# Patient Record
Sex: Male | Born: 1945
Health system: Southern US, Community
[De-identification: ages and names within clinical notes are randomized; demographics above are authoritative.]

## PROBLEM LIST (undated history)

## (undated) DIAGNOSIS — R002 Palpitations: Secondary | ICD-10-CM

## (undated) DIAGNOSIS — E7800 Pure hypercholesterolemia, unspecified: Secondary | ICD-10-CM

## (undated) DIAGNOSIS — K449 Diaphragmatic hernia without obstruction or gangrene: Secondary | ICD-10-CM

## (undated) DIAGNOSIS — N529 Male erectile dysfunction, unspecified: Secondary | ICD-10-CM

## (undated) DIAGNOSIS — Z8711 Personal history of peptic ulcer disease: Secondary | ICD-10-CM

## (undated) DIAGNOSIS — E119 Type 2 diabetes mellitus without complications: Secondary | ICD-10-CM

## (undated) DIAGNOSIS — T4145XA Adverse effect of unspecified anesthetic, initial encounter: Secondary | ICD-10-CM

## (undated) DIAGNOSIS — Z87442 Personal history of urinary calculi: Secondary | ICD-10-CM

## (undated) DIAGNOSIS — K5790 Diverticulosis of intestine, part unspecified, without perforation or abscess without bleeding: Secondary | ICD-10-CM

## (undated) DIAGNOSIS — Z91038 Other insect allergy status: Secondary | ICD-10-CM

## (undated) DIAGNOSIS — K2990 Gastroduodenitis, unspecified, without bleeding: Secondary | ICD-10-CM

## (undated) DIAGNOSIS — C801 Malignant (primary) neoplasm, unspecified: Secondary | ICD-10-CM

## (undated) DIAGNOSIS — K2970 Gastritis, unspecified, without bleeding: Secondary | ICD-10-CM

## (undated) DIAGNOSIS — K222 Esophageal obstruction: Secondary | ICD-10-CM

## (undated) DIAGNOSIS — D369 Benign neoplasm, unspecified site: Secondary | ICD-10-CM

## (undated) DIAGNOSIS — F419 Anxiety disorder, unspecified: Secondary | ICD-10-CM

## (undated) DIAGNOSIS — N400 Enlarged prostate without lower urinary tract symptoms: Secondary | ICD-10-CM

## (undated) DIAGNOSIS — Z91030 Bee allergy status: Secondary | ICD-10-CM

## (undated) DIAGNOSIS — M1990 Unspecified osteoarthritis, unspecified site: Secondary | ICD-10-CM

## (undated) DIAGNOSIS — I10 Essential (primary) hypertension: Secondary | ICD-10-CM

## (undated) DIAGNOSIS — K648 Other hemorrhoids: Secondary | ICD-10-CM

## (undated) DIAGNOSIS — I440 Atrioventricular block, first degree: Secondary | ICD-10-CM

## (undated) DIAGNOSIS — J45909 Unspecified asthma, uncomplicated: Secondary | ICD-10-CM

## (undated) DIAGNOSIS — K219 Gastro-esophageal reflux disease without esophagitis: Secondary | ICD-10-CM

## (undated) DIAGNOSIS — T8859XA Other complications of anesthesia, initial encounter: Secondary | ICD-10-CM

## (undated) DIAGNOSIS — B958 Unspecified staphylococcus as the cause of diseases classified elsewhere: Secondary | ICD-10-CM

## (undated) HISTORY — DX: Anxiety disorder, unspecified: F41.9

## (undated) HISTORY — DX: Personal history of urinary calculi: Z87.442

## (undated) HISTORY — DX: Benign neoplasm, unspecified site: D36.9

## (undated) HISTORY — PX: OTHER SURGICAL HISTORY: SHX169

## (undated) HISTORY — DX: Pure hypercholesterolemia, unspecified: E78.00

## (undated) HISTORY — DX: Other insect allergy status: Z91.038

## (undated) HISTORY — DX: Atrioventricular block, first degree: I44.0

## (undated) HISTORY — DX: Esophageal obstruction: K22.2

## (undated) HISTORY — DX: Benign prostatic hyperplasia without lower urinary tract symptoms: N40.0

## (undated) HISTORY — DX: Type 2 diabetes mellitus without complications: E11.9

## (undated) HISTORY — DX: Essential (primary) hypertension: I10

## (undated) HISTORY — DX: Diverticulosis of intestine, part unspecified, without perforation or abscess without bleeding: K57.90

## (undated) HISTORY — DX: Gastro-esophageal reflux disease without esophagitis: K21.9

## (undated) HISTORY — DX: Personal history of peptic ulcer disease: Z87.11

## (undated) HISTORY — DX: Unspecified asthma, uncomplicated: J45.909

## (undated) HISTORY — DX: Diaphragmatic hernia without obstruction or gangrene: K44.9

## (undated) HISTORY — DX: Other hemorrhoids: K64.8

## (undated) HISTORY — DX: Bee allergy status: Z91.030

## (undated) HISTORY — DX: Palpitations: R00.2

## (undated) HISTORY — DX: Male erectile dysfunction, unspecified: N52.9

## (undated) HISTORY — DX: Gastritis, unspecified, without bleeding: K29.70

## (undated) HISTORY — DX: Gastroduodenitis, unspecified, without bleeding: K29.90

---

## 1998-03-21 ENCOUNTER — Ambulatory Visit (HOSPITAL_COMMUNITY): Admission: RE | Admit: 1998-03-21 | Discharge: 1998-03-21 | Payer: Self-pay | Admitting: General Surgery

## 2000-01-07 ENCOUNTER — Emergency Department (HOSPITAL_COMMUNITY): Admission: EM | Admit: 2000-01-07 | Discharge: 2000-01-07 | Payer: Self-pay | Admitting: Emergency Medicine

## 2000-01-07 ENCOUNTER — Encounter: Payer: Self-pay | Admitting: Emergency Medicine

## 2000-03-01 ENCOUNTER — Encounter: Payer: Self-pay | Admitting: Gastroenterology

## 2000-03-01 ENCOUNTER — Ambulatory Visit (HOSPITAL_COMMUNITY): Admission: RE | Admit: 2000-03-01 | Discharge: 2000-03-01 | Payer: Self-pay | Admitting: Gastroenterology

## 2001-03-19 ENCOUNTER — Encounter: Admission: RE | Admit: 2001-03-19 | Discharge: 2001-03-19 | Payer: Self-pay | Admitting: Gastroenterology

## 2001-03-19 ENCOUNTER — Encounter: Payer: Self-pay | Admitting: Gastroenterology

## 2001-12-17 ENCOUNTER — Ambulatory Visit (HOSPITAL_COMMUNITY): Admission: RE | Admit: 2001-12-17 | Discharge: 2001-12-17 | Payer: Self-pay | Admitting: Orthopedic Surgery

## 2001-12-17 ENCOUNTER — Encounter: Payer: Self-pay | Admitting: Orthopedic Surgery

## 2002-02-03 ENCOUNTER — Inpatient Hospital Stay (HOSPITAL_COMMUNITY): Admission: RE | Admit: 2002-02-03 | Discharge: 2002-02-07 | Payer: Self-pay | Admitting: Neurosurgery

## 2002-02-03 ENCOUNTER — Encounter: Payer: Self-pay | Admitting: Neurosurgery

## 2002-07-22 ENCOUNTER — Encounter: Payer: Self-pay | Admitting: Emergency Medicine

## 2002-07-22 ENCOUNTER — Emergency Department (HOSPITAL_COMMUNITY): Admission: EM | Admit: 2002-07-22 | Discharge: 2002-07-22 | Payer: Self-pay | Admitting: Emergency Medicine

## 2003-10-12 ENCOUNTER — Ambulatory Visit (HOSPITAL_COMMUNITY): Admission: RE | Admit: 2003-10-12 | Discharge: 2003-10-12 | Payer: Self-pay | Admitting: Gastroenterology

## 2004-03-14 ENCOUNTER — Ambulatory Visit: Payer: Self-pay | Admitting: Gastroenterology

## 2004-10-26 ENCOUNTER — Ambulatory Visit: Payer: Self-pay | Admitting: Cardiovascular Disease

## 2004-10-30 ENCOUNTER — Ambulatory Visit: Payer: Self-pay | Admitting: Gastroenterology

## 2004-11-01 ENCOUNTER — Ambulatory Visit (HOSPITAL_COMMUNITY): Admission: RE | Admit: 2004-11-01 | Discharge: 2004-11-01 | Payer: Self-pay | Admitting: Gastroenterology

## 2004-11-23 ENCOUNTER — Ambulatory Visit: Payer: Self-pay | Admitting: Gastroenterology

## 2005-05-29 ENCOUNTER — Ambulatory Visit: Payer: Self-pay | Admitting: Gastroenterology

## 2005-06-22 ENCOUNTER — Ambulatory Visit: Payer: Self-pay | Admitting: Gastroenterology

## 2005-07-25 ENCOUNTER — Ambulatory Visit: Payer: Self-pay | Admitting: Gastroenterology

## 2005-11-05 ENCOUNTER — Ambulatory Visit: Payer: Self-pay | Admitting: Cardiovascular Disease

## 2005-12-12 ENCOUNTER — Ambulatory Visit: Payer: Self-pay | Admitting: Gastroenterology

## 2005-12-31 ENCOUNTER — Ambulatory Visit: Payer: Self-pay | Admitting: Gastroenterology

## 2006-01-18 ENCOUNTER — Ambulatory Visit: Payer: Self-pay | Admitting: Cardiology

## 2006-01-25 ENCOUNTER — Encounter: Payer: Self-pay | Admitting: Internal Medicine

## 2006-01-25 ENCOUNTER — Ambulatory Visit (HOSPITAL_COMMUNITY): Admission: RE | Admit: 2006-01-25 | Discharge: 2006-01-25 | Payer: Self-pay | Admitting: Internal Medicine

## 2006-02-13 ENCOUNTER — Ambulatory Visit: Payer: Self-pay | Admitting: Gastroenterology

## 2006-02-26 ENCOUNTER — Encounter (INDEPENDENT_AMBULATORY_CARE_PROVIDER_SITE_OTHER): Payer: Self-pay | Admitting: Specialist

## 2006-02-26 ENCOUNTER — Ambulatory Visit: Payer: Self-pay | Admitting: Gastroenterology

## 2006-09-04 ENCOUNTER — Ambulatory Visit: Payer: Self-pay | Admitting: Gastroenterology

## 2006-09-17 ENCOUNTER — Ambulatory Visit: Payer: Self-pay | Admitting: Gastroenterology

## 2006-12-10 ENCOUNTER — Ambulatory Visit: Payer: Self-pay | Admitting: Internal Medicine

## 2007-01-09 ENCOUNTER — Ambulatory Visit: Payer: Self-pay | Admitting: Cardiovascular Disease

## 2007-02-12 ENCOUNTER — Ambulatory Visit: Payer: Self-pay | Admitting: Internal Medicine

## 2007-02-12 LAB — CONVERTED CEMR LAB
Fecal Occult Blood: NEGATIVE
OCCULT 1: NEGATIVE
OCCULT 2: NEGATIVE
OCCULT 3: NEGATIVE
OCCULT 4: NEGATIVE
OCCULT 5: NEGATIVE

## 2007-03-07 ENCOUNTER — Ambulatory Visit: Payer: Self-pay | Admitting: Cardiovascular Disease

## 2007-04-01 ENCOUNTER — Ambulatory Visit: Payer: Self-pay | Admitting: Internal Medicine

## 2007-04-02 ENCOUNTER — Ambulatory Visit: Payer: Self-pay | Admitting: Internal Medicine

## 2007-06-27 DIAGNOSIS — I10 Essential (primary) hypertension: Secondary | ICD-10-CM | POA: Insufficient documentation

## 2007-06-27 DIAGNOSIS — F528 Other sexual dysfunction not due to a substance or known physiological condition: Secondary | ICD-10-CM | POA: Insufficient documentation

## 2007-06-27 DIAGNOSIS — Z87898 Personal history of other specified conditions: Secondary | ICD-10-CM | POA: Insufficient documentation

## 2007-06-27 DIAGNOSIS — M479 Spondylosis, unspecified: Secondary | ICD-10-CM | POA: Insufficient documentation

## 2007-06-27 DIAGNOSIS — K589 Irritable bowel syndrome without diarrhea: Secondary | ICD-10-CM | POA: Insufficient documentation

## 2007-06-27 DIAGNOSIS — F411 Generalized anxiety disorder: Secondary | ICD-10-CM | POA: Insufficient documentation

## 2007-06-27 DIAGNOSIS — K219 Gastro-esophageal reflux disease without esophagitis: Secondary | ICD-10-CM | POA: Insufficient documentation

## 2007-12-10 ENCOUNTER — Telehealth: Payer: Self-pay | Admitting: Internal Medicine

## 2008-01-12 ENCOUNTER — Encounter (INDEPENDENT_AMBULATORY_CARE_PROVIDER_SITE_OTHER): Payer: Self-pay | Admitting: *Deleted

## 2008-06-24 ENCOUNTER — Telehealth: Payer: Self-pay | Admitting: Internal Medicine

## 2008-07-21 ENCOUNTER — Ambulatory Visit: Payer: Self-pay | Admitting: Internal Medicine

## 2008-07-21 DIAGNOSIS — R1319 Other dysphagia: Secondary | ICD-10-CM | POA: Insufficient documentation

## 2008-07-22 ENCOUNTER — Telehealth: Payer: Self-pay | Admitting: Internal Medicine

## 2008-08-05 ENCOUNTER — Encounter: Payer: Self-pay | Admitting: Cardiovascular Disease

## 2008-08-05 ENCOUNTER — Ambulatory Visit: Payer: Self-pay | Admitting: Cardiovascular Disease

## 2008-08-05 DIAGNOSIS — R002 Palpitations: Secondary | ICD-10-CM | POA: Insufficient documentation

## 2008-08-05 DIAGNOSIS — M129 Arthropathy, unspecified: Secondary | ICD-10-CM | POA: Insufficient documentation

## 2008-09-07 ENCOUNTER — Ambulatory Visit: Payer: Self-pay | Admitting: Internal Medicine

## 2008-09-09 ENCOUNTER — Telehealth: Payer: Self-pay | Admitting: Internal Medicine

## 2008-09-15 ENCOUNTER — Encounter: Payer: Self-pay | Admitting: Internal Medicine

## 2008-09-15 ENCOUNTER — Ambulatory Visit: Payer: Self-pay | Admitting: Internal Medicine

## 2008-09-15 HISTORY — PX: COLONOSCOPY W/ BIOPSIES AND POLYPECTOMY: SHX1376

## 2008-09-19 ENCOUNTER — Encounter: Payer: Self-pay | Admitting: Internal Medicine

## 2008-10-27 ENCOUNTER — Emergency Department (HOSPITAL_COMMUNITY): Admission: EM | Admit: 2008-10-27 | Discharge: 2008-10-27 | Payer: Self-pay | Admitting: Emergency Medicine

## 2008-10-27 ENCOUNTER — Telehealth: Payer: Self-pay | Admitting: Internal Medicine

## 2008-10-27 ENCOUNTER — Telehealth: Payer: Self-pay | Admitting: Gastroenterology

## 2008-10-28 ENCOUNTER — Telehealth: Payer: Self-pay | Admitting: Internal Medicine

## 2008-11-01 ENCOUNTER — Ambulatory Visit: Payer: Self-pay | Admitting: Internal Medicine

## 2008-11-01 DIAGNOSIS — R1013 Epigastric pain: Secondary | ICD-10-CM | POA: Insufficient documentation

## 2008-11-01 DIAGNOSIS — Z860100 Personal history of colon polyps, unspecified: Secondary | ICD-10-CM | POA: Insufficient documentation

## 2009-04-21 ENCOUNTER — Telehealth: Payer: Self-pay | Admitting: Internal Medicine

## 2009-06-08 ENCOUNTER — Encounter (INDEPENDENT_AMBULATORY_CARE_PROVIDER_SITE_OTHER): Payer: Self-pay | Admitting: *Deleted

## 2009-08-16 ENCOUNTER — Ambulatory Visit: Payer: Self-pay | Admitting: Internal Medicine

## 2009-08-18 ENCOUNTER — Encounter (INDEPENDENT_AMBULATORY_CARE_PROVIDER_SITE_OTHER): Payer: Self-pay | Admitting: *Deleted

## 2009-09-21 ENCOUNTER — Ambulatory Visit: Payer: Self-pay | Admitting: Cardiovascular Disease

## 2009-09-21 DIAGNOSIS — I440 Atrioventricular block, first degree: Secondary | ICD-10-CM | POA: Insufficient documentation

## 2009-09-28 ENCOUNTER — Ambulatory Visit: Payer: Self-pay | Admitting: Cardiovascular Disease

## 2009-10-05 ENCOUNTER — Encounter (INDEPENDENT_AMBULATORY_CARE_PROVIDER_SITE_OTHER): Payer: Self-pay | Admitting: *Deleted

## 2009-10-05 LAB — CONVERTED CEMR LAB
ALT: 25 units/L (ref 0–53)
AST: 22 units/L (ref 0–37)
Albumin: 4.2 g/dL (ref 3.5–5.2)
Alkaline Phosphatase: 76 units/L (ref 39–117)
Bilirubin, Direct: 0.1 mg/dL (ref 0.0–0.3)
Cholesterol: 167 mg/dL (ref 0–200)
HDL: 34.8 mg/dL — ABNORMAL LOW (ref >39.00)
LDL Cholesterol: 114 mg/dL — ABNORMAL HIGH (ref 0–99)
Total Bilirubin: 0.6 mg/dL (ref 0.3–1.2)
Total CHOL/HDL Ratio: 5
Total Protein: 7.3 g/dL (ref 6.0–8.3)
Triglycerides: 93 mg/dL (ref 0.0–149.0)
VLDL: 18.6 mg/dL (ref 0.0–40.0)

## 2009-12-14 ENCOUNTER — Ambulatory Visit: Payer: Self-pay | Admitting: Internal Medicine

## 2009-12-14 ENCOUNTER — Encounter (INDEPENDENT_AMBULATORY_CARE_PROVIDER_SITE_OTHER): Payer: Self-pay | Admitting: *Deleted

## 2010-02-07 ENCOUNTER — Ambulatory Visit: Payer: Self-pay | Admitting: Internal Medicine

## 2010-02-07 HISTORY — PX: UPPER GASTROINTESTINAL ENDOSCOPY: SHX188

## 2010-02-13 ENCOUNTER — Telehealth: Payer: Self-pay | Admitting: Cardiovascular Disease

## 2010-06-13 NOTE — Assessment & Plan Note (Signed)
Summary: dysphagia...as.    History of Present Illness Visit Type: Follow-up Visit Primary GI MD: Stan Head MD Primary Provider: n/a Requesting Provider: n/a Chief Complaint: dysphagia  History of Present Illness:   65 yo white man with GERD and recurrent dysphagia that responds to periodic esophageal dilation (no stricture). In the last 2-3 weeks some recurrent dysphagia though he does not think he needs a dilation. Recent left flank pain thought due to puuled muscle and treated with a patch/topical therapy. There is a knot on ribs also, now. His new prescription plan will not cover Zegerid.   GI Review of Systems    Reports acid reflux and  dysphagia with solids.      Denies abdominal pain, belching, bloating, chest pain, dysphagia with liquids, heartburn, loss of appetite, nausea, vomiting, vomiting blood, weight loss, and  weight gain.        Denies anal fissure, black tarry stools, change in bowel habit, constipation, diarrhea, diverticulosis, fecal incontinence, heme positive stool, hemorrhoids, irritable bowel syndrome, jaundice, light color stool, liver problems, rectal bleeding, and  rectal pain.    EGD  Procedure date:  09/15/2008  Findings:      1) Mildly tortuous esophagus, dilated with 54 French dilator due to dysphagia and known benefit  2) 2 cm hiatal hernia in the cardia  3) Otherwise normal examination.   Current Medications (verified): 1)  Alprazolam 0.25 Mg Tabs (Alprazolam) .... Take 1 Tablet By Mouth Once Daily 2)  Zegerid 20-1100 Mg Caps (Omeprazole-Sodium Bicarbonate) .[name].. 1 Capsule By Mouth Two Times A Day 3)  Robaxin 500 Mg Tabs (Methocarbamol) .... 1/2 Pill As Needed 4)  Naprosyn 500 Mg Tabs (Naproxen) .[name].. 1 Tablet By Mouth Once Daily As Needed 5)  Cozaar 50 Mg Tabs (Losartan Potassium) .[name].. 1 Once Daily 6)  Cialis 5 Mg Tabs (Tadalafil) .... 1/2 As Needed 7)  Cvs Vitamin E 400 Unit Caps (Vitamin E) .... One Tablet By Mouth Once Daily 8)   Benefiber  Powd (Wheat Dextrin) .[name].. 1-2 Teaspoons Once Daily 9)  Advair Diskus 100-50 Mcg/dose Misc (Fluticasone-Salmeterol) .... As Needed 10)  Omega-3 350 Mg Caps (Omega-3 Fatty Acids) .... One Tablet By Mouth Once Daily  Allergies (verified): 1)  ! * Dilaudid  Past History:  Past Medical History: Diverticulosis IBS  Anxiety GERD Chronic recurrent dysphagia Osteoarthritis, C-spine especially Erectie Dysfunction BPH Hypertension Palpitations Adenomatous colon polyps May 2010  Past Surgical History: right foot surgery bilateral inguinal hernia repair nasal septoplasty lower back surgery with lumbar vertebral repair  Family History: Reviewed history from 07/21/2008 and no changes required. No FH of Colon Cancer: father had lung cancer Family History of Heart Disease: father  Social History: Reviewed history from 07/21/2008 and no changes required. 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  Alcohol Use - no Daily Caffeine Use sodas and tea Illicit Drug Use - no avid tennis player  Vital Signs:  Patient profile:   65 year old male Height:      71 inches Weight:      239 pounds BMI:     33.45 BSA:     2.28 Pulse rate:   76 / minute Pulse rhythm:   regular BP sitting:   142 / 86  (left arm) Cuff size:   regular  Vitals Entered By: Ok Anis CMA (August 16, 2009 3:43 PM)  Physical Exam  General:  Well developed, well nourished, no acute distress.   Impression & Recommendations:  Problem # 1:  GERD (  ICD-530.81) Assessment Unchanged 15 min spent discussing today he is ok on two times a day OTC Zegerid but naybe not quite as well as 40 mg once daily. It looks like generic Zegerid 40 mg is on his formulary and we will prescribe that.  Problem # 2:  DYSPHAGIA (ICD-787.29) Assessment: Deteriorated Slightly worse but "not ready for a dilation" He has this and its multifactorial with components of GERD, anxiety phenomenon and ? C-spine  contribution. Will observe and monitor, follow-up as needed  Patient Instructions: 1)  Please continue current medications.  2)  We have a sent a refill for generic zegerid.  If this requires a prior Berkley Harvey we will call the insurance company for you and work this out. 3)  The medication list was reviewed and reconciled.  All changed / newly prescribed medications were explained.  A complete medication list was provided to the patient / caregiver. Prescriptions: OMEPRAZOLE-SODIUM BICARBONATE 40-1100 MG CAPS (OMEPRAZOLE-SODIUM BICARBONATE) 1 by mouth 30-60 mnutes before breakfast  #30 x 11   Entered and Authorized by:   Iva Boop MD, [facility]   Signed by:   Iva Boop MD, [facility] on 08/16/2009   Method used:   Electronically to        [facility] (retail)       [address]       [place], [place]  [id number]       Ph: [phone]       Fax: [phone]   RxID:   [id number]

## 2010-06-13 NOTE — Miscellaneous (Signed)
Summary: miralax prep   Clinical Lists Changes  Medications: Added new medication of DULCOLAX 5 MG  TBEC (BISACODYL) Day before procedure take 2 at 3pm and 2 at 8pm. - Signed Added new medication of METOCLOPRAMIDE HCL 10 MG  TABS (METOCLOPRAMIDE HCL) As per prep instructions. - Signed Added new medication of MIRALAX   POWD (POLYETHYLENE GLYCOL 3350) As per prep  instructions. - Signed Rx of DULCOLAX 5 MG  TBEC (BISACODYL) Day before procedure take 2 at 3pm and 2 at 8pm.;  #4 x 0;  Signed;  Entered by: Burman Foster RN;  Authorized by: Iva Boop MD;  Method used: Electronically to [facility] [address], [place], [place], [place]  [id number], Ph: [phone], Fax: [phone] Rx of METOCLOPRAMIDE HCL 10 MG  TABS (METOCLOPRAMIDE HCL) As per prep instructions.;  #2 x 0;  Signed;  Entered by: Burman Foster RN;  Authorized by: Iva Boop MD;  Method used: Electronically to [facility] [address], [place], [place], [place]  [id number], Ph: [phone], Fax: [phone] Rx of MIRALAX   POWD (POLYETHYLENE GLYCOL 3350) As per prep  instructions.;  #255gm x 0;  Signed;  Entered by: Burman Foster RN;  Authorized by: Iva Boop MD;  Method used: Electronically to [facility] [address], [place], [place], [place]  [id number], Ph: [phone], Fax: [phone]    Prescriptions: MIRALAX   POWD (POLYETHYLENE GLYCOL 3350) As per prep  instructions.  #255gm x 0   Entered by:   Burman Foster RN   Authorized by:   Iva Boop MD   Signed by:   Burman Foster RN on 09/07/2008   Method used:   Electronically to        [facility] (retail)       [address]       [place], [place]  [id number]       Ph: [phone]       Fax: [phone]   RxID:   [id number] METOCLOPRAMIDE HCL 10 MG  TABS (METOCLOPRAMIDE HCL) As per prep instructions.  #2 x 0   Entered by:    Burman Foster RN   Authorized by:   Iva Boop MD   Signed by:   Burman Foster RN on 09/07/2008   Method used:   Electronically to        [facility] (retail)       [address]       [place], [place]  [address]       Ph: [phone]       Fax: [phone]   RxID:   [id number] DULCOLAX 5 MG  TBEC (BISACODYL) Day before procedure take 2 at 3pm and 2 at 8pm.  #4 x 0   Entered by:   Burman Foster RN   Authorized by:   Iva Boop MD   Signed by:   Burman Foster RN on 09/07/2008   Method used:   Electronically to        [facility] (retail)       [address]       [place], [place]  [address]       Ph: [phone]       Fax: [phone]   RxID:   [id number]

## 2010-06-13 NOTE — Letter (Signed)
Summary: Appointment - Reminder 2  [facility]  [address]   [place], [place] [address]   Phone: [phone]  Fax: [phone]     June 08, 2009 MRN: [id number]   Alex Castillo [address] [place], [place]  [address]   Dear Mr. Scroggs,  Our records indicate that it is time to schedule a follow-up appointment with Dr. Eden Emms. It is very important that we reach you to schedule this appointment. We look forward to participating in your health care needs. Please contact us at the number listed above at your earliest convenience to schedule your appointment.  If you are unable to make an appointment at this time, give [place] a call so we can update our records.  Sincerely,   Migdalia Dk [facility] Scheduling Team

## 2010-06-13 NOTE — Progress Notes (Signed)
Summary: TRIAGE   Phone Note Call from Patient Call back at [phone]   Caller: Patient Call For: Bernhard Koskinen  Reason for Call: Talk to Nurse Summary of Call: having alot of upper abd pain. would like to be seen today (was seen at [facility] ER) Initial call taken by: Guadlupe [facility],  October 28, 2008 8:38 AM  Follow-up for Phone Call        Called pt. He states he was having pain at bottom sternum and upper abd for 2 days and got worse yesterday. He took Zantac, Xanax, Mylanta, and Reglan and Zegerid, but he did not get any relief.He did vomit one time. Pt went to [facility] and they did CT scan, labs and EKG. Pt states he is playing tennis right now and the nausea is better. He was given Phenergan which is helping, but felt he needed to be seen to check for an ulcer .Pt was advised to take the Zegerid bid, Mylanta as needed and to avoid spicey foods and will work pt in with Paula-NP  on 11-01-08 at 10:30am. He will call back if symptoms get worse.Ulis Rias RN  October 28, 2008 8:56 AM

## 2010-06-13 NOTE — Letter (Signed)
Summary: Hydrographic surveyor at [facility]  [address]   [place], [place] [address]   Phone: [phone]  Fax:      Oct 05, 2009 MRN: [id number]   WAGNER TANZI [address] [place], [place]  [address]   Dear Mr. Shives,  We have reviewed your cholesterol results.  They are as follows:     Total Cholesterol:    167 (Desirable: less than 200)       HDL  Cholesterol:     34.80  (Desirable: greater than 40 for men and 50 for women)       LDL Cholesterol:       114  (Desirable: less than 100 for low risk and less than 70 for moderate to high risk)       Triglycerides:       93.0  (Desirable: less than 150)  Our recommendations include:These numbers look good. Continue on the same medicine. Watch your diet closer. Liver function is normal. Take care, Dr. Leanora Cover.    Call our office at the number listed above if you have any questions.  Lowering your LDL cholesterol is important, but it is only one of a large number of "risk factors" that may indicate that you are at risk for heart disease, stroke or other complications of hardening of the arteries.  Other risk factors include:   A.  Cigarette Smoking* B.  High Blood Pressure* C.  Obesity* D.   Low HDL Cholesterol (see yours above)* E.   Diabetes Mellitus (higher risk if your is uncontrolled) F.  Family history of premature heart disease G.  Previous history of stroke or cardiovascular disease    *These are risk factors YOU HAVE CONTROL OVER.  For more information, visit .  There is now evidence that lowering the TOTAL CHOLESTEROL AND LDL CHOLESTEROL can reduce the risk of heart disease.  The American Heart Association recommends the following guidelines for the treatment of elevated cholesterol:  1.  If there is now current heart disease and less than two risk factors, TOTAL CHOLESTEROL should be less than 200 and LDL CHOLESTEROL should be less than 100. 2.  If there is current heart disease or  two or more risk factors, TOTAL CHOLESTEROL should be less than 200 and LDL CHOLESTEROL should be less than 70.  A diet low in cholesterol, saturated fat, and calories is the cornerstone of treatment for elevated cholesterol.  Cessation of smoking and exercise are also important in the management of elevated cholesterol and preventing vascular disease.  Studies have shown that 30 to 60 minutes of physical activity most days can help lower blood pressure, lower cholesterol, and keep your weight at a healthy level.  Drug therapy is used when cholesterol levels do not respond to therapeutic lifestyle changes (smoking cessation, diet, and exercise) and remains unacceptably high.  If medication is started, it is important to have you levels checked periodically to evaluate the need for further treatment options.  Thank you,    [facility] Team

## 2010-06-13 NOTE — Progress Notes (Signed)
Summary: chest pain/EGD   Phone Note Call from Patient Call back at Work Phone [phone]   Caller: Patient Call For: Dr. Leone Payor Reason for Call: Talk to Nurse Details for Reason: chest pain/EGD Summary of Call: pt having chest pain over last 3-4 days and concerned with upcoming EGD... would like to call closer to EGD (next Wednesday), but doesnt want to be charged if he needs to cancel Initial call taken by: Vallarie Mare,  September 09, 2008 8:40 AM  Follow-up for Phone Call        Left message for patient to call back Darcey Nora RN  September 09, 2008 9:12 AM  Patient with chest pressure and tightness that started for the last 3 days. He feels it is bronchitis and not cardiac.  I have advised him that he is to call Dr Eden Emms and give him and update.  Patient  has a hx of asthma, and has a clean cardiac report last year.  He did have some SOB with the chest pain and gas.  He was able to play tennis and work out with weights yesterday but was SOB when the CP started.  Patient would like to have the endo next week, but very concerned he won't be able to finish his prep for the colonoscopy.  He has been taking Robitussin for his chest tightness and has advair that he is using to treat himself for bronchitis.  I have advised him he needs to contact Dr Eden Emms but he is reluctant to do so.  He did however agree to go to his primary care MD today for a check up.  I have advised him we will do the procedures as long as his primary care MD is comfortable with him proceeding and he has no fever.  He will call back by tomorrow at 5 if he wants to cancel the colon and just have endo.  Please advise if anything needs to be instructed differently.  Follow-up by: Darcey Nora RN,  September 09, 2008 9:57 AM  Additional Follow-up for Phone Call Additional follow up Details #1::        lets see what PCP says, re: cause of sxs and then will decide Additional Follow-up by: Iva Boop MD,  September 09, 2008 11:11  AM    Additional Follow-up for Phone Call Additional follow up Details #2::    He saw his primary care MD this morning, Dr Polly Cobia.  He was given a breathing tx, they increased his advair to two times a day, and added another inhaler.  They thought all pulmonary symptoms and no cardiac problems this am.  Dr Harrison Mons thought he was fine to have both procedures next week. Follow-up by: Darcey Nora RN,  September 09, 2008 1:07 PM

## 2010-06-13 NOTE — Procedures (Signed)
Summary: Colonoscopy: adenoma, diverticulosis, hemorrhoids   Colonoscopy  Procedure date:  09/15/2008  Findings:      Location:  [facility].    Colonoscopy  Procedure date:  09/15/2008  Findings:      1) 7 mm sessile polyp in the proximal transverse colon, removed 2) 4 mm diminutive polyp at the splenic flexure, removed ADENOMA AND HYPERPLASTIC 3) Diverticulosis 4) Internal hemorrhoids 5) Otherwise normal examination, excellent prep  Comments:      Repeat colonoscopy in 5 years.   Procedures Next Due Date:    Colonoscopy: 09/2013  COLONOSCOPY PROCEDURE REPORT  PATIENT:  Alex Castillo, Alex Castillo  MR#:  [id number] BIRTHDATE:   February 05, 1946, 65 yrs. old   GENDER:   male  ENDOSCOPIST:   Iva Boop, MD, [facility]    PROCEDURE DATE:  09/15/2008 PROCEDURE:  Colonoscopy with snare polypectomy ASA CLASS:   Class II INDICATIONS: colorectal cancer screening, average risk   MEDICATIONS:    Fentanyl 25 mcg IV, Versed 2 mg IV, residual sedation present from prior procedure  DESCRIPTION OF PROCEDURE:   After the risks benefits and alternatives of the procedure were thoroughly explained, informed consent was obtained.  Digital rectal exam was performed and revealed no abnormalities and normal prostate.   The LB CF-[id number] endoscope was introduced through the anus and advanced to the cecum in 1:23 minutes, which was identified by both the appendix and ileocecal valve, without limitations.  The quality of the prep was excellent, using MiraLax.  The instrument was then slowly withdrawn as the colon was fully examined in 9:15 minutes. <<PROCEDUREIMAGES>>          <<OLD IMAGES>>  FINDINGS:  A sessile polyp was found in the proximal transverse colon. It was 7 mm in size. Polyp was snared without cautery. Retrieval was successful. snare polyp  A diminutive polyp was found at the splenic flexure. It was 4 mm in size. Polyp was snared without cautery. Retrieval was successful. snare  polyp  Scattered diverticulosis in right colon and moderate in left colon. This was otherwise a normal examination of the colon.   Retroflexed views in the rectum revealed internal hemorrhoids.    The scope was then withdrawn from the patient and the procedure completed.  COMPLICATIONS:   None  ENDOSCOPIC IMPRESSION:  1) 7 mm sessile polyp in the proximal transverse colon, removed  2) 4 mm diminutive polyp at the splenic flexure, removed 3) Diverticulosis  4) Internal hemorrhoids  5) Otherwise normal examination, excellent prep     REPEAT EXAM:   In for Colonoscopy, pending biopsy results.    Iva Boop, MD, [facility]  CC: Nadine Counts, MD The Patient    REPORT OF SURGICAL PATHOLOGY   Case #: [id number] Patient Name: Alex Castillo, Alex Castillo Office Chart Number:  [id number]   MRN: [id number] Pathologist: Alden Server A. Delila Spence, MD DOB/Age  [birth year] (Age: 65)    Gender: M Date Taken:  09/15/2008 Date Received: 09/15/2008   FINAL DIAGNOSIS   ***MICROSCOPIC EXAMINATION AND DIAGNOSIS***   COLON, TRANSVERSE AND SPLENIC FLEXURE, POLYPS:   -  FRAGMENTS OF HYPERPLASTIC POLYP AND ONE TUBULAR ADENOMA WITH FOCAL  ULCERATION AND ACTIVE MUCOSAL INFLAMMATION. -  NO HIGH GRADE DYSPLASIA OR INVASIVE MALIGNANCY IDENTIFIED  kv Date Reported:  09/16/2008     Alden Server A. Delila Spence, MD *** Electronically Signed Out By EAA ***         Sep 19, 2008 MRN: [id number]    Alex Castillo [address]  [place], [place]  [address]    Dear Alex Castillo,  The polyps removed from your colon were adenomatous. This means that they were pre-cancerous or that  they had the potential to change into cancer over time.  I recommend that you have a repeat colonoscopy in 5 years to determine if you have developed any new polyps over time. If you develop any new rectal bleeding, abdominal pain or significant bowel habit changes, please contact us before then.   Please call us if you are having persistent problems  or have questions about your condition that have not been fully answered at this time.   Sincerely,  Iva Boop MD  This letter has been electronically signed by your physician. This report was created from the original endoscopy report, which was reviewed and signed by the above listed endoscopist.

## 2010-06-13 NOTE — Procedures (Signed)
Summary: Gastroenterology EGD  Gastroenterology EGD   Imported By: Milford Cage CMA 06/27/2007 16:09:11  _____________________________________________________________________  External Attachment:    Type:   Image     Comment:   External Document

## 2010-06-13 NOTE — Miscellaneous (Signed)
Summary: Generic Zegerid PA  I spoke to [place] at [facility] who states that generic Zegerid is not covered by plan and PA can not be done except as an appeal.  Boyd Kerbs states that brand Zegerid is covered by patient's plan.  A new prescription for brand Zegerid sent to pt pharm.  Clinical Lists Changes  Medications: Changed medication from OMEPRAZOLE-SODIUM BICARBONATE 40-1100 MG CAPS (OMEPRAZOLE-SODIUM BICARBONATE) 1 by mouth 30-60 mnutes before breakfast to ZEGERID 40-1100 MG CAPS (OMEPRAZOLE-SODIUM BICARBONATE) Take 1 capsule by mouth once a day 30 minutes before breakfast [BMN] - Signed Rx of ZEGERID 40-1100 MG CAPS (OMEPRAZOLE-SODIUM BICARBONATE) Take 1 capsule by mouth once a day 30 minutes before breakfast;  #30 x 11 Brand medically necessary;  Signed;  Entered by: Francee Piccolo CMA (AAMA);  Authorized by: Iva Boop MD, Clementeen Graham;  Method used: Electronically to [facility] [address] [place], [place], [place]  [id number], Ph: [phone], Fax: [phone]    Prescriptions: ZEGERID 40-1100 MG CAPS (OMEPRAZOLE-SODIUM BICARBONATE) Take 1 capsule by mouth once a day 30 minutes before breakfast Brand medically necessary #30 x 11   Entered by:   Francee Piccolo CMA (AAMA)   Authorized by:   Iva Boop MD, [facility]   Signed by:   Francee Piccolo CMA (AAMA) on 08/18/2009   Method used:   Electronically to        [facility] (retail)       [address]       [place], [place]  [id number]       Ph: [phone]       Fax: [phone]   RxID:   [id number]

## 2010-06-13 NOTE — Progress Notes (Signed)
Summary: Dental Office   Phone Note Outgoing Call   Call placed by: Julieta Gutting, RN, BSN,  February 13, 2010 1:49 PM Call placed to: Patient Summary of Call: I received a phone call from a dental office about whether this pt required SBE prior to dental work.  I reviewed the pt's chart and did not see any indication for this pt to have antibiotics.  The dental office said the pt had taken them in the past but were unsure why the pt was taking them.  I contacted the pt and he said Dr Sherlyn Lick had told him many years ago that he needed to do SBE due to a murmur.  After reviewing Dr Fabio Bering notes he does not mention a murmur.  I made the pt aware that he does not require SBE. Initial call taken by: Julieta Gutting, RN, BSN,  February 13, 2010 1:49 PM

## 2010-06-13 NOTE — Letter (Signed)
Summary: EGD Instructions  [facility]  [address] [place], [place] [address]   Phone: [phone]  Fax: [phone]       SUNG PARODI    1945/09/05    MRN: [id number]       Procedure Day Dorna Bloom: Jake Shark, 02/07/10     Arrival Time: 7:30 AM     Procedure Time: 8:30 AM     Location of Procedure:                    _X_ [facility] (4th Floor)  PREPARATION FOR ENDOSCOPY   On TUESDAY, 02/07/10, THE DAY OF THE PROCEDURE:  1.   No solid foods, milk or milk products are allowed after midnight the night before your procedure.  2.   Do not drink anything colored red or purple.  Avoid juices with pulp.  No orange juice.  3.  You may drink clear liquids until 6:30 AM, which is 2 hours before your procedure.                                                                                                CLEAR LIQUIDS INCLUDE: Water Jello Ice Popsicles Tea (sugar ok, no milk/cream) Powdered fruit flavored drinks Coffee (sugar ok, no milk/cream) Gatorade Juice: apple, white grape, white cranberry  Lemonade Clear bullion, consomm, broth Carbonated beverages (any kind) Strained chicken noodle soup Hard Candy   MEDICATION INSTRUCTIONS  Unless otherwise instructed, you should take regular prescription medications with a small sip of water as early as possible the morning of your procedure.                 OTHER INSTRUCTIONS  You will need a responsible adult at least 63 years of age to accompany you and drive you home.   This person must remain in the waiting room during your procedure.  Wear loose fitting clothing that is easily removed.  Leave jewelry and other valuables at home.  However, you may wish to bring a book to read or an iPod/MP3 player to listen to music as you wait for your procedure to start.  Remove all body piercing jewelry and leave at home.  Total time from sign-in until discharge is approximately 2-3 hours.  You  should go home directly after your procedure and rest.  You can resume normal activities the day after your procedure.  The day of your procedure you should not:   Drive   Make legal decisions   Operate machinery   Drink alcohol   Return to work  You will receive specific instructions about eating, activities and medications before you leave.    The above instructions have been reviewed and explained to me by   _______________________    I fully understand and can verbalize these instructions _____________________________ Date _________

## 2010-06-13 NOTE — Procedures (Signed)
Summary: Upper Endoscopy w/DIL  Patient: Gardiner Coins Note: All result statuses are Final unless otherwise noted.  Tests: (1) Upper Endoscopy w/DIL (UED)  UED Upper Endoscopy w/DIL                             DONE     [facility]     [address]     [place], [place]  [address]           ENDOSCOPY PROCEDURE REPORT           PATIENT:  Alex Castillo, Alex Castillo  MR#:  [id number]     BIRTHDATE:  17-Mar-1946, 64 yrs. old  GENDER:  male           ENDOSCOPIST:  Iva Boop, MD, [facility]           PROCEDURE DATE:  02/07/2010     PROCEDURE:  EGD, diagnostic, Elease Hashimoto Dilation of the Esophagus     ASA CLASS:  Class II     INDICATIONS:  1) dysphagia dilation of esophagus     he responds to periodic dilation           MEDICATIONS:   Fentanyl 50 mcg IV, Versed 7 mg IV     TOPICAL ANESTHETIC:  Exactacain Spray           DESCRIPTION OF PROCEDURE:   After the risks benefits and     alternatives of the procedure were thoroughly explained, informed     consent was obtained.  The LB GIF-H180 [id number] endoscope was     introduced through the mouth and advanced to the second portion of     the duodenum, without limitations.  The instrument was slowly     withdrawn as the mucosa was carefully examined.     <<PROCEDUREIMAGES>>           The esophagus was tortuous throughout. Mild-moderate degree. A     hiatal hernia was found. It was 2 cm in size.  The examination was     otherwise normal.    Dilation was then performed at the total     esophagus           1) Dilator:  Elease Hashimoto  Size(s):  54 French     Resistance:  minimal  Heme:  none           COMPLICATIONS:  None           ENDOSCOPIC IMPRESSION:     1) Tortuous esophagus in the total esophagus - suggestive of     dysmotility - dilated to 54 French     2) 2 cm hiatal hernia     3) Otherwise normal examination.     RECOMMENDATIONS:     Clear liquids until 1030 AM then soft diet today.     Normal diet tomorrow.     Follow-up as needed with  Dr. Leone Payor.           REPEAT EXAM:  In for as needed.           Iva Boop, MD, [facility]           CC:  Nadine Counts, M.D.     The Patient           n.     eSIGNED:   Iva Boop at 02/07/2010 08:58 AM           Gardiner Coins, [id number]  Note: An exclamation mark (!) indicates a result that was not dispersed into the flowsheet. Document Creation Date: 02/07/2010 8:59 AM _______________________________________________________________________  (1) Order result status: Final Collection or observation date-time: 02/07/2010 08:49 Requested date-time:  Receipt date-time:  Reported date-time:  Referring Physician:   Ordering Physician: Stan Head [phone]) Specimen Source:  Source: Launa Grill Order Number: [id number] Lab site:

## 2010-06-13 NOTE — Assessment & Plan Note (Signed)
Summary: [facility]   History of Present Illness: Alex Castillo returns for followup of his hypertension and palpitations.  He doing well.  Palpitations resolved.  He is active playing tennis and doing elliptical training.  His blood pressure seems to be under reasonable control but he does not check it at home.  Diet is somewhat poor.  He can do a better job with sodium and carbohydrates.  He has been compliant with his medications.  Is not having significant headache palpitations PND or orthopnea no chest pain no evidence of vascular disease claudication TIA or CVA.  Problems Prior to Update: 1)  Palpitations  (ICD-785.1) 2)  Special Screening For Malignant Neoplasms Colon  (ICD-V76.51) 3)  Dysphagia  (ICD-787.29) 4)  Ibs  (ICD-564.1) 5)  Anxiety  (ICD-300.00) 6)  Gerd  (ICD-530.81) 7)  Osteoarthritis, Cervical Spine  (ICD-721.90) 8)  Erectile Dysfunction  (ICD-302.72) 9)  Benign Prostatic Hypertrophy, Hx of  (ICD-V13.8) 10)  Hypertension  (ICD-401.9)  Current Medications (verified): 1)  Alprazolam 0.25 Mg Tabs (Alprazolam) .... Take 1 Tablet By Mouth Once Daily 2)  Zegerid 40-1100 Mg Caps (Omeprazole-Sodium Bicarbonate) .... Take 1 Capsule By Mouth Once A Day 3)  Robaxin 500 Mg Tabs (Methocarbamol) .... 1/2 Pill As Needed 4)  Naprosyn 500 Mg Tabs (Naproxen) .[name].. 1 Tablet By Mouth Once Daily 5)  Cozaar 50 Mg Tabs (Losartan Potassium) .[name].. 1 Once Daily 6)  Aspirin 81 Mg Tbec (Aspirin) .... As Needed 7)  Flomax 0.4 Mg Xr24h-Cap (Tamsulosin Hcl) .[name].. 1 Once Daily 8)  Cialis 5 Mg Tabs (Tadalafil) .... 1/2 As Needed 9)  Cvs Vitamin E 400 Unit Caps (Vitamin E) .... As Needed 10)  Benefiber  Powd (Wheat Dextrin) .[name].. 1-2 Teaspoons Once Daily 11)  Advair Diskus 100-50 Mcg/dose Misc (Fluticasone-Salmeterol) .... As Needed  Allergies (verified): No Known Drug Allergies  Past History:  Past Medical History:    Diverticulosis    IBS     Anxiety    GERD     Chronic recurrent dysphagia   Osteoarthritis, C-spine especially    Erectie Dysfunction    BPH    Hypertension    Palpitations (07/21/2008)  Family History:    No FH of Colon Cancer:    father had lung cancer    Family History of Heart Disease: father     (07/21/2008)  Social History:    Occupation: Nurse, learning disability    Patient has never smoked.     Alcohol Use - no    Daily Caffeine Use sodas and tea    Illicit Drug Use - no    avid tennis player     (07/21/2008)  Review of Systems       Positive for lower and cervical spine problems.  Sees Georgia Duff.  Also significant for dysphagia has had esophageal dilatatoin in past. Othersystems reviewed and all other negative  Vital Signs:  Patient profile:   65 year old male Weight:      239 pounds Pulse rate:   74 / minute BP sitting:   153 / 90  (left arm)  Vitals Entered By: Kem Parkinson (August 05, 2008 9:52 AM)  Physical Exam  General:  Affect appropriate Healthy:  appears stated age HEENT: normal Neck supple with no adenopathy JVP normal no bruits no thyromegaly Lungs clear with no wheezing and good diaphragmatic motion Heart:  S1/S2 no murmur,rub, gallop or click PMI normal Abdomen: benighn, BS positve, no tenderness, no AAA no bruit.  No HSM or HJR  Distal pulses intact with no bruits No edema Neuro non-focal Skin warm and dry Anterior Cervical Spine surg scar.  ECG normal SR 72 bpm   Impression & Recommendations:  Problem # 1:  HYPERTENSION (ICD-401.9) Stable continue meds.  Improve on diet. Get home BP moniter His updated medication list for this problem includes:    Cozaar 50 Mg Tabs (Losartan potassium) .[name]... 1 once daily    Aspirin 81 Mg Tbec (Aspirin) .[name]... As needed  Problem # 2:  PALPITATIONS (ICD-785.1) Resolved. His updated medication list for this problem includes:    Aspirin 81 Mg Tbec (Aspirin) .[name]... As needed  Problem # 3:  GERD (ICD-530.81) Wtih associated dysphagia.  F/U g.i. for EGD His updated medication  list for this problem includes:    Zegerid 40-1100 Mg Caps (Omeprazole-sodium bicarbonate) .[name]... Take 1 capsule by mouth once a day  Problem # 4:  ARTHRITIS (ICD-716.90) F/U Dr. Venetia Maxon.  Continue as needed NSAI.  Continue PT/OT.  Patient Instructions: 1)  Get home BP monitor 2)  F/U Dr. Eden Emms 1 year.

## 2010-06-13 NOTE — Assessment & Plan Note (Signed)
Summary: ROV/ GD      Allergies Added:   Referring Provider:  n/a Primary Provider:  n/a  CC:  check up.  History of Present Illness: Alex Castillo returns for followup of his hypertension and palpitations.  He doing well.  Palpitations resolved.  He is active playing tennis and doing elliptical training.  His blood pressure seems to be under reasonable control but he does not check it at home.  Diet is somewhat poor.  He can do a better job with sodium and carbohydrates.  He has been compliant with his medications.  Is not having significant headache palpitations PND or orthopnea no chest pain no evidence of vascular disease claudication TIA or CVA. He will come back to get a fasting lipid and liver profile.  He needs a new primary as Sid Harrison Mons is apparantly doing hospitalist work now  Current Problems (verified): 1)  Personal Hx Colonic Polyps  (ICD-V12.72) 2)  Abdominal Pain-epigastric  (ICD-789.06) 3)  Arthritis  (ICD-716.90) 4)  Palpitations  (ICD-785.1) 5)  Special Screening For Malignant Neoplasms Colon  (ICD-V76.51) 6)  Dysphagia  (ICD-787.29) 7)  Ibs  (ICD-564.1) 8)  Anxiety  (ICD-300.00) 9)  Gerd  (ICD-530.81) 10)  Osteoarthritis, Cervical Spine  (ICD-721.90) 11)  Erectile Dysfunction  (ICD-302.72) 12)  Benign Prostatic Hypertrophy, Hx of  (ICD-V13.8) 13)  Hypertension  (ICD-401.9)  Current Medications (verified): 1)  Alprazolam 0.25 Mg Tabs (Alprazolam) .... Take 1 Tablet By Mouth Once Daily 2)  Zegerid 40-1100 Mg Caps (Omeprazole-Sodium Bicarbonate) .... Take 1 Capsule By Mouth Once A Day 30 Minutes Before Breakfast 3)  Robaxin 500 Mg Tabs (Methocarbamol) .... 1/2 Pill As Needed 4)  Naprosyn 500 Mg Tabs (Naproxen) .[name].. 1 Tablet By Mouth Once Daily As Needed 5)  Cozaar 50 Mg Tabs (Losartan Potassium) .[name].. 1 Once Daily 6)  Cialis 5 Mg Tabs (Tadalafil) .... 1/2 As Needed 7)  Cvs Vitamin E 400 Unit Caps (Vitamin E) .... One Tablet By Mouth Once Daily 8)  Benefiber  Powd (Wheat  Dextrin) .[name].. 1-2 Teaspoons Once Daily 9)  Advair Diskus 100-50 Mcg/dose Misc (Fluticasone-Salmeterol) .... As Needed 10)  Omega-3 350 Mg Caps (Omega-3 Fatty Acids) .... One Tablet By Mouth Once Daily  Allergies (verified): 1)  ! * Dilaudid  Past History:  Past Medical History: Last updated: 08/16/2009 Diverticulosis IBS  Anxiety GERD Chronic recurrent dysphagia Osteoarthritis, C-spine especially Erectie Dysfunction BPH Hypertension Palpitations Adenomatous colon polyps May 2010  Past Surgical History: Last updated: 08/16/2009 right foot surgery bilateral inguinal hernia repair nasal septoplasty lower back surgery with lumbar vertebral repair  Family History: Last updated: 07/21/2008 No FH of Colon Cancer: father had lung cancer Family History of Heart Disease: father  Social History: Last updated: 07/21/2008 Occupation: Nurse, 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  Alcohol Use - no Daily Caffeine Use sodas and tea Illicit Drug Use - no avid tennis player  Review of Systems       Denies fever, malais, weight loss, blurry vision, decreased visual acuity, cough, sputum, SOB, hemoptysis, pleuritic pain, palpitaitons, heartburn, abdominal pain, melena, lower extremity edema, claudication, or rash.   Vital Signs:  Patient profile:   65 year old male Height:      71 inches Weight:      234 pounds BMI:     32.75 Pulse rate:   65 / minute Resp:     14 per minute BP sitting:   160 / 92  (left arm)  Vitals Entered By: Kem Parkinson (Sep 21, 2009 10:21 AM)   Impression & Recommendations:  Problem # 1:  PALPITATIONS (ICD-785.1) Resolved no further w/u  Problem # 2:  HYPERTENSION (ICD-401.9) Well controlled continue low salt diet and weight loss His updated medication list for this problem includes:    Cozaar 50 Mg Tabs (Losartan potassium) .[name]... 1 once daily  Problem # 3:  AV BLOCK, 1ST DEGREE (ICD-426.11) Avoid BB's.  No evidence of high grade heart  block.  Follow ECG yearly  Patient Instructions: 1)  Your physician recommends that you schedule a follow-up appointment in: 1 YEAR WITH DR. Eden Emms 2)  Your physician recommends that you return for a FASTING lipid profile AND LIVER 272.4,401.1 3)  Your physician recommends that you continue on your current medications as directed. Please refer to the Current Medication list given to you today. Prescriptions: COZAAR 50 MG TABS (LOSARTAN POTASSIUM) 1 once daily  #30 x 11   Entered by:   Lisabeth Devoid RN   Authorized by:   Colon Branch, MD, [facility]   Signed by:   Lisabeth Devoid RN on 09/21/2009   Method used:   Electronically to        [facility] (retail)       [address]       [place], [place]  [address]       Ph: [phone]       Fax: [phone]   RxID:   [id number]    EKG Report  Procedure date:  09/21/2009  Findings:      NSR 65 PR 216 Otherwise Normal ECG

## 2010-06-13 NOTE — Assessment & Plan Note (Signed)
Summary: dysphagia...em    History of Present Illness Visit Type: Follow-up Visit Primary GI MD: Stan Head MD Primary Provider: na Requesting Provider: n/a Chief Complaint: Dysphagia  History of Present Illness:   65 yo wm followed for IBS, GERD and recurrent dysphagia (cause not entirely clear but responds to periodic dilation). He is having tightness in lower sternum and epigastric area with regurgitation of food. Has had impact dysphagia with biscuits. It began to be a problem again in the last 1-2 months. He is using Zegerid 20-1100 mg OTC and 20 mg omeprazole OTC daily as the Zegerid 40 mg is not covered by insurance.   GI Review of Systems    Reports dysphagia with solids.      Denies abdominal pain, acid reflux, belching, bloating, chest pain, dysphagia with liquids, heartburn, loss of appetite, nausea, vomiting, vomiting blood, weight loss, and  weight gain.        Denies anal fissure, black tarry stools, change in bowel habit, constipation, diarrhea, diverticulosis, fecal incontinence, heme positive stool, hemorrhoids, irritable bowel syndrome, jaundice, light color stool, liver problems, rectal bleeding, and  rectal pain.     Current Medications (verified): 1)  Alprazolam 0.25 Mg Tabs (Alprazolam) .... Take 1 To 1/2  Tablet By Mouth Once Daily 2)  Omeprazole 20 Mg  Cpdr (Omeprazole) .[name].. 1 Each Day Am 3)  Robaxin 500 Mg Tabs (Methocarbamol) .... 1/2 Pill As Needed 4)  Naprosyn 500 Mg Tabs (Naproxen) .[name].. 1 Tablet By Mouth Once Daily As Needed 5)  Cozaar 50 Mg Tabs (Losartan Potassium) .[name].. 1 Once Daily 6)  Cialis 5 Mg Tabs (Tadalafil) .... 1/2 As Needed 7)  Cvs Vitamin E 400 Unit Caps (Vitamin E) .... One Tablet By Mouth Once Daily 8)  Benefiber  Powd (Wheat Dextrin) .[name].. 1-2 Teaspoons Once Daily 9)  Advair Diskus 100-50 Mcg/dose Misc (Fluticasone-Salmeterol) .... As Needed 10)  Omega-3 350 Mg Caps (Omega-3 Fatty Acids) .... One Tablet By Mouth Once Daily 11)   Zegerid Otc 20-1100 Mg Caps (Omeprazole-Sodium Bicarbonate) .[name].. 1 By Mouth Each Am  Allergies (verified): 1)  ! * Dilaudid  Past History:  Past Medical History: Reviewed history from 08/16/2009 and no changes required. Diverticulosis IBS  Anxiety GERD Chronic recurrent dysphagia Osteoarthritis, C-spine especially Erectie Dysfunction BPH Hypertension Palpitations Adenomatous colon polyps May 2010  Past Surgical History: Reviewed history from 08/16/2009 and no changes required. right foot surgery bilateral inguinal hernia repair nasal septoplasty lower back surgery with lumbar vertebral repair  Family History: Reviewed history from 07/21/2008 and no changes required. No FH of Colon Cancer: father had lung cancer Family History of Heart Disease: father Family History of Colitis/Crohn's:Daughter, also has PSC  Social History: Reviewed history from 07/21/2008 and no changes required. 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  Alcohol Use - no Daily Caffeine Use sodas and tea Illicit Drug Use - no avid tennis player  Review of Systems       neck and  joint pains when he plays tennis  Vital Signs:  Patient profile:   65 year old male Height:      71 inches Weight:      231 pounds BMI:     32.33 BSA:     2.24 Pulse rate:   64 / minute Pulse rhythm:   regular BP sitting:   142 / 86  (left arm) Cuff size:   regular  Vitals Entered By: Ok Anis CMA (December 14, 2009 4:00 PM)  Physical Exam  General:  Well developed, well nourished, no acute distress.   Impression & Recommendations:  Problem # 1:  DYSPHAGIA (ZOX-096.04) Assessment Deteriorated Recurrent problem. 2007 ba swallow showed cervical spine osteophytes he does improve for a year or so after dilation ? component of dysmotility - he declines manometry study or repeat barium study and desires Elease Hashimoto dilation this is reasonable so will proceed   Orders: EGD (EGD)  Problem # 2:   GERD (ICD-530.81) Assessment: Unchanged continue current therapy (PPI) he is using an OTC Zegerid and OTC omeprazole to save $  Problem # 3:  ANXIETY (ICD-300.00) Assessment: Unchanged refill Xanax  Problem # 4:  OSTEOARTHRITIS, CERVICAL SPINE (ICD-721.90) Assessment: Unchanged Ibuprofen prn  Patient Instructions: 1)  Please pick up your medications at your pharmacy.  2)  We will see you at your procedure on 02/07/10. 3)  [facility] Patient Information Guide given to patient.  4)  Upper Endoscopy with Dilatation brochure given.  5)  The medication list was reviewed and reconciled.  All changed / newly prescribed medications were explained.  A complete medication list was provided to the patient / caregiver. Prescriptions: IBUPROFEN 600 MG TABS (IBUPROFEN) 1 by mouth one-two times  daily as needed every 4-6 hours  #90 x 11   Entered and Authorized by:   Iva Boop MD, [facility]   Signed by:   Iva Boop MD, [facility] on 12/14/2009   Method used:   Electronically to        [facility] (retail)       [address]       [place], [place]  [id number]       Ph: [phone]       Fax: [phone]   RxID:   [id number] ALPRAZOLAM 0.25 MG TABS (ALPRAZOLAM) Take 1 to 1/2  tablet by mouth 1-3 timesdaily  #90 x 2   Entered and Authorized by:   Iva Boop MD, [facility]   Signed by:   Iva Boop MD, FACG on 12/14/2009   Method used:   Printed then faxed to ...       [facility] (retail)       [address]       [place], [place]  [id number]       Ph: [phone]       Fax: [phone]   RxID:   [id number]

## 2010-08-21 LAB — COMPREHENSIVE METABOLIC PANEL
ALT: 24 U/L (ref 0–53)
AST: 36 U/L (ref 0–37)
Albumin: 4.1 g/dL (ref 3.5–5.2)
Alkaline Phosphatase: 77 U/L (ref 39–117)
BUN: 13 mg/dL (ref 6–23)
CO2: 28 mEq/L (ref 19–32)
Calcium: 9.3 mg/dL (ref 8.4–10.5)
Chloride: 104 mEq/L (ref 96–112)
Creatinine, Ser: 0.9 mg/dL (ref 0.4–1.5)
GFR calc Af Amer: >60 mL/min (ref >60)
GFR calc non Af Amer: >60 mL/min (ref >60)
Glucose, Bld: 113 mg/dL — ABNORMAL HIGH (ref 70–99)
Potassium: 4.4 mEq/L (ref 3.5–5.1)
Sodium: 140 mEq/L (ref 135–145)
Total Bilirubin: 0.8 mg/dL (ref 0.3–1.2)
Total Protein: 7.3 g/dL (ref 6.0–8.3)

## 2010-08-21 LAB — DIFFERENTIAL
Basophils Absolute: 0 10*3/uL (ref 0.0–0.1)
Basophils Relative: 1 % (ref 0–1)
Eosinophils Absolute: 0.2 10*3/uL (ref 0.0–0.7)
Eosinophils Relative: 3 % (ref 0–5)
Lymphocytes Relative: 29 % (ref 12–46)
Lymphs Abs: 1.9 10*3/uL (ref 0.7–4.0)
Monocytes Absolute: 0.6 10*3/uL (ref 0.1–1.0)
Monocytes Relative: 10 % (ref 3–12)
Neutro Abs: 3.8 10*3/uL (ref 1.7–7.7)
Neutrophils Relative %: 59 % (ref 43–77)

## 2010-08-21 LAB — CBC
HCT: 45.3 % (ref 39.0–52.0)
Hemoglobin: 15.9 g/dL (ref 13.0–17.0)
MCHC: 35.2 g/dL (ref 30.0–36.0)
MCV: 91.3 fL (ref 78.0–100.0)
Platelets: 179 10*3/uL (ref 150–400)
RBC: 4.97 MIL/uL (ref 4.22–5.81)
RDW: 13 % (ref 11.5–15.5)
WBC: 6.5 10*3/uL (ref 4.0–10.5)

## 2010-08-21 LAB — LIPASE, BLOOD: Lipase: 26 U/L (ref 11–59)

## 2010-08-21 LAB — POCT CARDIAC MARKERS
CKMB, poc: 1.4 ng/mL (ref 1.0–8.0)
Myoglobin, poc: 53.8 ng/mL (ref 12–200)
Troponin i, poc: <0.05 ng/mL (ref 0.00–0.09)

## 2010-09-14 ENCOUNTER — Telehealth: Payer: Self-pay | Admitting: Cardiovascular Disease

## 2010-09-14 NOTE — Telephone Encounter (Signed)
Spoke with pt, he has been to a couple appt and his bp has been elevated. He is in a lot of pain due to a neck injury and also a lot of stress. He took 1/2 xanax today and his bp is down to 176/70. He feels fine. He has an appt tomorrow with scott weaver pa and will be discussed at that appt. Deliah Goody

## 2010-09-14 NOTE — Telephone Encounter (Signed)
Pt wants to talk to a nurse re his meds. Pt states his blood pressure 190/95 today.

## 2010-09-15 ENCOUNTER — Encounter: Payer: Self-pay | Admitting: Physician Assistant

## 2010-09-15 ENCOUNTER — Ambulatory Visit (INDEPENDENT_AMBULATORY_CARE_PROVIDER_SITE_OTHER): Payer: BC Managed Care – PPO | Admitting: Physician Assistant

## 2010-09-15 VITALS — BP 154/90 | HR 62 | Ht 71.0 in | Wt 231.0 lb

## 2010-09-15 DIAGNOSIS — I10 Essential (primary) hypertension: Secondary | ICD-10-CM

## 2010-09-15 MED ORDER — LOSARTAN POTASSIUM-HCTZ 50-12.5 MG PO TABS: 1.0000 | ORAL_TABLET | Freq: Every day | ORAL | Status: DC

## 2010-09-15 NOTE — Assessment & Plan Note (Signed)
I had a long discussion with him today regarding lifestyle modifications for his blood pressure.  He will cut back on his salt as well as his caffeine.  He does use nonsteroidal anti-inflammatory drugs quite often.  He will also try to cut back on this.  I will have him bring his blood pressure cuff and check with ours for accuracy.  He will keep an eye on his blood pressures.  I have given him a prescription for Hyzaar 50/12.5 mg once daily.  If over the next few weeks, his blood pressures remain over 140/90, he will stop the Cozaar and start the Hyzaar.  He will call us to schedule a basic metabolic panel one week later.  I will see him in 6-8 weeks for followup.

## 2010-09-15 NOTE — Patient Instructions (Signed)
Hold on to the prescription for Hyzaar 50/12.5 mg for now.  Keep taking your Cozaar 50 mg. Cut down on salt and caffeine and limit your use of naproxen, ibuprofen, motrin, advil. Schedule visit with nurse in next week to check your home BP cuff with our cuff to make sure your cuff is accurate. Take your BP 2-3 times a week.   If your BP remains over 140/90: Stop the Cozaar. Start the Hyzaar 50/12.5 mg QD. Call us to tell us you have changed the medication and schedule a blood test (BMET) one week after starting the Hyzaar. Schedule a follow up with Tereso Newcomer, PA-C in 6-8 weeks for blood pressure.

## 2010-09-15 NOTE — Progress Notes (Signed)
History of Present Illness: Primary Cardiologist:  Dr. Charlton Haws  Alex Castillo is a 65 y.o. male with a history of hypertension and palpitations presents today for follow up of his blood pressure.  He went to the dentist the other day and his pressure was 190 systolic.  He was in a little bit of pain that day.  He took his pressures with the nurse at work.  They range anywhere from 130 to 150 systolic.  He does have a cuff at home but does not check his pressure that often.  He denies headaches, chest pain, shortness of breath, syncope.  He does have some mild pedal edema.  He denies blurry vision, melena, hematochezia, dysuria.  He denies any fevers or chills.  He does eat quite a bit of salt.  He does not drink much water.  He drinks a lot of caffeine.  He does still play tennis a couple times a week.  Past Medical History  Diagnosis Date  . HTN (hypertension)     ETT negative 10/08  . Palpitations   . First degree AV block   . IBS (irritable bowel syndrome)   . Anxiety   . GERD (gastroesophageal reflux disease)   . ED (erectile dysfunction)   . BPH (benign prostatic hyperplasia)     Current Outpatient Prescriptions  Medication Sig Dispense Refill  . Ascorbic Acid (VITAMIN C) 1000 MG tablet Take 1,000 mg by mouth daily.        [name] aspirin 81 MG tablet Take 81 mg by mouth daily.        . fish oil-omega-3 fatty acids 1000 MG capsule Take 2 g by mouth daily.        [name] ibuprofen (ADVIL,MOTRIN) 200 MG tablet Take 200 mg by mouth every 6 (six) hours as needed.        [name] losartan (COZAAR) 50 MG tablet 1 tab daily      . naproxen (NAPROSYN) 500 MG tablet Take 500 mg by mouth 2 (two) times daily with a meal. As needed       . Omeprazole-Sodium Bicarbonate (ZEGERID PO) Take 40 mg by mouth.        . Silodosin (RAPAFLO PO) Take by mouth.        . tadalafil (CIALIS) 10 MG tablet Take 10 mg by mouth daily as needed.        . vitamin E 200 UNIT capsule Take 200 Units by mouth daily.           Allergies  Allergen Reactions  . Hydromorphone Hcl     History  Substance Use Topics  . Smoking status: Never Smoker   . Smokeless tobacco: Not on file  . Alcohol Use: Not on file   ROS:  See HPI.  All other systems reviewed and negative.  Vital Signs: BP 154/90  Pulse 62  Ht 5\' 11"  (1.803 m)  Wt 231 lb (104.781 kg)  BMI 32.22 kg/m2  PHYSICAL EXAM: Well nourished, well developed, in no acute distress HEENT: normal Neck: no JVD Vascular: No carotid bruits Endocrine: No thyromegaly Cardiac:  normal S1, S2; RRR; no murmur Lungs:  clear to auscultation bilaterally, no wheezing, rhonchi or rales Abd: soft, nontender, no hepatomegaly Ext: Trace bilateral pedal edema Skin: warm and dry Neuro:  CNs 2-12 intact, no focal abnormalities noted  EKG:  Sinus rhythm, heart rate 62, normal axis, first degree AV block with a PR interval of 216 ms, no ischemic changes, no significant change  since tracing dated 09/21/09  ASSESSMENT AND PLAN:

## 2010-09-26 NOTE — Assessment & Plan Note (Signed)
Alex Castillo                         GASTROENTEROLOGY OFFICE NOTE   ENGLISH, Alex Castillo                    MRN:          [id number]  DATE:12/10/2006                            DOB:          [birth year]    CHIEF COMPLAINT:  Dysphagia, question refill on Xanax.   PROBLEMS:  1. Chronic recurrent dysphagia problems.  They respond best to Xanax.      There is a history of cervical spine disease, which may be related.      He has a history of lower esophageal ring.  Dilation of that over      the years has provided transient benefit.  2. Hiatal hernia.  3. Normal screening colonoscopy in 2000.  4. Obesity.  5. Recent foot surgery of the right foot.  6. Bilateral inguinal hernia repair.  7. Benign prostatic hypertrophy.  8. Nasal septoplasty.  9. Low back surgery with lumbar vertebral fracture, Dr. Venetia Maxon.  10.Erectile dysfunction.  11.Barium swallow September 2007 showing the cervical spine disease      with osteophyte causing some penetration of the esophageus.   HISTORY:  Alex Castillo returns.  He has been a patient of Dr. Victorino Dike.  He reports that, when he takes Xanax, he does not have dysphagia  problems.  He has a lump in his throat with some globus sensation at  times, but clearly he has trouble swallowing, helped by Xanax, usually  about 1 a day.  Sometimes he takes it twice a day.  He also has a lot of  neck pain that is helped by the Xanax.  He had seen Dr. Venetia Maxon about a  year ago and it sounds like he had cervical spine injections, which may  have helped somewhat as well.  He has not followed up with Dr. Venetia Maxon.  He has gained some weight, which has disturbed him, though he thinks he  does not eat so much.  He knows he could cut some calories, and he has  been less active due to foot surgery.  He likes to play a lot of tennis.  He is not a smoker.   PHYSICAL EXAMINATION:  Overweight to obese white male.  Height 5 feet 11 inches, weight  231 pounds, pulse 68, blood pressure  156/78.  The eyes are anicteric.  Nose looks normal.  Hearing intact.  NECK:  Supple.  No thyromegaly or mass.  CHEST:  Clear.  HEART:  S1, S2.  I hear no murmurs, rubs, or gallops.  No jugular venous  distension.  ABDOMEN:  Somewhat obese, soft, and nontender without organomegaly or  mass.  I do not detect any hernia today.  He is alert and oriented x3.   ASSESSMENT:  1. Chronic recurrent dysphagia problems that respond to Xanax.      Suggests some type of spastic disorder as well as the possible      contribution of C spine disease.  I have never heard of quite a      scenario where he has pain in the neck treated by the Xanax, but      that may again be  a muscle spasm issue, perhaps due to nerve root      irritation.  2. Gastroesophageal reflux disease.  3. Eight years out from screening colonoscopy.   PLAN:  1. Check home Hemoccult.  2. Refill AcipHex 20 mg b.i.d.  It is controlling his heartburn and      reflux.  3. Refill Xanax 0.25 mg t.i.d. p.r.n. #90 with several refills.  He      uses about 1 a day.  If this is working, I think we ought to stick      with this, because it does not sound like dilation has made much      difference for his dysphagia and globus sensation.  4. I do think he should follow up with Dr. Venetia Maxon and I have asked him      to do so regarding his cervical spine disease.   I will see him back as needed at this point.  If he is Hemoccult  positive, would go ahead with a colonoscopy now.  Otherwise, routine  colonoscopy 10 years from the last one.  Sooner if signs or symptoms  develop.  Note, he has no signs or symptoms of colonic problems at this  time.     Iva Boop, MD,FACG  Electronically Signed    CEG/MedQ  DD: 12/10/2006  DT: 12/11/2006  Job #: [id number]   cc:   Noralyn Pick. Eden Emms, MD, [facility]  Caryn Bee. Venetia Maxon, M.D.

## 2010-09-26 NOTE — Assessment & Plan Note (Signed)
[facility]                            CARDIOLOGY OFFICE NOTE   KA, FLAMMER                          MRN:          [id number]  DATE:01/09/2007                            DOB:          [birth year]    Alex Castillo returns today for followup.  I have not seen him in a while.  I had  initially seen him for some benign palpitations after back surgery.  He  did well with beta-blocker therapy.  He had a nonischemic Myoview study  back in July 2005.   The patient has significant hypertension.  It has been somewhat  suboptimally treated.  He was last seen by Tereso Newcomer, PA-C, I believe  in 2006.  At that time, the addition of Altace was recommended to his  beta-blocker.  However, the patient did not take it.  He would prefer to  be on one medication.  He is only on low dose Toprol 25 mg a day.  He  takes Cialis and I explained to him that there may be an interaction  with Toprol in regards to erectile dysfunction.  Since his blood  pressure appears to be suboptimally controlled, I told him what I would  prefer to do is stop his beta-blocker and place him on Lisinopril 40 mg  a day.  At that point, I could then bring him back in about 8 weeks and  see what his blood pressure response is to exercise.  The patient is  quite active.  He plays tennis 3-4 times per week at a fairly high  level.  He is not getting chest pain, PND, or orthopnea.  His palpations  have subsided.   REVIEW OF SYSTEMS:  Remarkable for occasional reflux which he takes  Aciphex for.  He does get occasional bilateral knee pain which he takes  Meloxicam for.  There has been some erectile dysfunction.  It is not  clear to me how long this has been going on.  The patient takes 2-3 tabs  weekly.   MEDICATIONS:  1. Toprol 25 mg a day.  2. Cialis p.r.n.  3. Aciphex 20 a day.  4. Meloxicam 7.5 b.i.d.  5. Vitamin E.   PHYSICAL EXAMINATION:  GENERAL:  Remarkable for a healthy-appearing,  middle-aged, white male in no distress.  Affect is appropriate.  VITAL SIGNS:  Weight is 237, blood pressure is 159/87, pulse is 76 and  regular, respiratory rate is 16.  He is afebrile.  HEENT:  Normal.  NECK:  Carotids normal without bruit.  There is no lymphadenopathy.  No  thyromegaly.  No JVP elevation.  LUNGS:  Clear with good diaphragmatic motion.  No wheezing.  HEART:  There is an S1 S2 with normal heart sounds.  There is no S4  gallop.  PMI is normal.  ABDOMEN:  Benign.  Bowel sounds positive.  No rebound, no bruits, no  AAA, no hepatosplenomegaly, no hepatojugular reflux, no tenderness.  Femorals are plus 4 bilaterally without bruits.  EXTREMITIES:  PTs are plus 3.  There is no lower extremity edema.  NEUROLOGIC:  Nonfocal.  SKIN:  Warm and dry.  MUSCULOSKELETAL:  There is no muscular weakness.   His baseline EKG is normal without LVH.   IMPRESSION:  1. Previous history of palpitations after a lumbar procedure,      currently resolved.  The patient is only taking 25 of Toprol.  We      will stop his beta-blocker and see how he does.  2. Hypertension, suboptimally controlled on low dose Toprol.  He wants      to be on one medication only.  I think an ACE inhibitor would be a      more effective drug.  He will start Lisinopril 40 mg a day.  He      will follow up with me in 8 weeks to have a treadmill test to      assess his blood pressure response.  3. I do not think he needs a Myoview study but he does take Cialis on      a regular basis and that will be needed in regards to screening for      coronary disease in the setting of taking vasodilators for erectile      dysfunction.  I encouraged the patient to follow up with his      medical doctor or urologist in regards to a prostate exam and make      sure there is nothing else going on in regards to this.  4. Arthritis secondary to tennis and activity levels.  Continue      meloxicam p.r.n.  5. Reflux.  Continue Aciphex  20 a day.  Avoid late night meals.  Low      spice diet.   I will see him back in 8 weeks.     Alex Pick. Eden Emms, MD, [facility]  Electronically Signed    PCN/MedQ  DD: 01/09/2007  DT: 01/10/2007  Job #: [id number]

## 2010-09-26 NOTE — Assessment & Plan Note (Signed)
[facility]                         GASTROENTEROLOGY OFFICE NOTE   Alex Castillo, Alex Castillo                    MRN:          [id number]  DATE:04/01/2007                            DOB:          05/25/1945    CHIEF COMPLAINT:  Dysphagia.   Alex Castillo returns complaining of feeling food is stopping in the lower  sternal area and he is filling up which has responded to dilation  before.  He is also complaining of problems with his neck and his  muscles tightening, and he gets numbness of his tongue and his jaw area.  He feels like it is hard to swallow in the upper esophageal area at  times, and Xanax seems to help this, as well as relieve his neck  problems.  He saw Alex Castillo sometime in the last year and had some  steroid injections in the neck area which seems to help.  He has put a  call in to Alex Castillo again because of neck stiffness and tightening.  He  is not really having any reflux.  He would like some refills on his  medications.  His medications are listed and review in the chart.  He is  using Cialis weekly, Aciphex 20 mg b.i.d., occasional meloxicam 7.5 mg  b.i.d., vitamin E 400 units daily, lisinopril 40 mg daily, Xanax 0.25 mg  p.r.n., probably 2 a day.  He takes it every morning now.  Nasacort  p.r.n., Reglan 5 mg p.r.n., Advair 50/100 p.r.n.   There are no known drug allergies.   PAST MEDICAL HISTORY:  Reviewed and unchanged from previous note of December 10, 2006.   He has known cervical spine disease with osteophytes causing some  penetration of the esophagus.  He has a lot of joint aches as well in  the lower extremities he says he can deal with, however.   Height 5 feet 11 inches, weight 227 pounds, pulse 72, blood pressure  142/68.   ASSESSMENT:  1. Dysphagia.  I think this is multifactorial.  It sounds like his      ring may be acting up some.  In addition, the cervical osteophytes      may be causing some impingement, but it  sounds like he may be      having some neuropathic-like complaints as well.  Perhaps cervical      spine problems.  He does not seem to be weak in his upper      extremities, though he has had some pain there off and on as well.      It is a somewhat confusing picture, but I think it is probably      multifactorial.  2. Also has osteoarthritis which sounds like it is fairly severe.   PLAN:  1. Schedule upper gastrointestinal endoscopy with esophageal dilation.  2. Could need other barium studies.  3. He is going to go back to see Alex Castillo.  Question if he needs EMG      studies, MRI.  Would have to defer to the expertise of Alex Castillo.      I think  I can help him some with his esophageal dysphagia, but I      get a sense that there is some sort of neuromuscular-type problem      which I think could be related to cervical spine disease.  4. He had asked about whether he should see a rheumatologist, that may      be a reasonable thing to do.  I asked him to consider discussing      that with Alex Castillo, his orthopedist.  We      will wait to see what happens with numbers 1 and 2.  5. I will refill his Aciphex, his Nasacort and his Xanax today.     Alex Boop, MD,FACG  Electronically Signed    CEG/MedQ  DD: 04/01/2007  DT: 04/01/2007  Job #: [id number]   cc:   Alex Marlin, MD  Alex Castillo, M.D.

## 2010-09-26 NOTE — Procedures (Signed)
[facility]                              EXERCISE TREADMILL   NUSSEN, PULLIN                    MRN:          [id number]  DATE:03/07/2007                            DOB:          1945-12-28    INDICATION:  Previous palpitations, hypertension.   The patient exercised 9 minutes 30 seconds under Bruce protocol.  Exercise was stopped due to fatigue and hypertension.  Maximum blood  pressure was 210/93.  Maximum heart rate was 158.  The patient had no  chest pain or arrhythmias.   IMPRESSION:  Negative exercise stress test.  However, the patient  continues to have a hypertensive response.   I will see him back in 6 months.  He will try to increase his Lisinopril  to 20 b.i.d. or 40 in the morning.  He will continue a low salt diet.     Noralyn Pick. Eden Emms, MD, [facility]  Electronically Signed    PCN/MedQ  DD: 03/07/2007  DT: 03/07/2007  Job #: [id number]

## 2010-09-29 NOTE — Assessment & Plan Note (Signed)
[facility]                              CARDIOLOGY OFFICE NOTE   BENEDICTO, CAPOZZI                    MRN:          [id number]  DATE:01/18/2006                            DOB:          [birth year]    Mr. Alex Castillo is a very pleasant 65 year old male patient followed by Dr.  Eden Emms with a history of high blood pressure. The patient has a history of a  non ischemic Myoview about 1-1/2 years ago.  He has been well controlled  with Toprol. Recently he has developed some posterior neck pain. This seems  to be more of a stress and with certain types of movement of his neck.  He  denies any paresthesias in his arms. He does note some difficulty with  swallowing but this is a chronic symptom and seemed to be related to his  acid reflux. He has noted that his blood pressures are increasing lately. He  did increase his Toprol from 25 to 50 mg daily.  He has actually gotten an  appointment to see Dr. Venetia Maxon some time next week for his neck.  He denies  any chest pain or shortness of breath. Denies any nausea, diaphoresis.  Denies any syncope or presyncope.   CURRENT MEDICATIONS:  1. Toprol XL 50 mg daily.  2. Flomax 0.4 mg daily.  3. Cialis.  4. Motrin 600 mg daily.  5. Vitamin shake.  6. Protonix 40 mg daily.   ALLERGIES:  No known drug allergies.   SOCIAL HISTORY:  He denies any tobacco abuse.   PHYSICAL EXAMINATION:  GENERAL:  He is a well-developed, well-nourished  male.  VITAL SIGNS:  Blood pressure 150/88, pulse 62. Weight 224 pounds.  HEENT:  Unremarkable.  NECK:  Without JVD.  CARDIAC:  S1, S2 regular rate and rhythm.  LUNGS:  Clear to auscultation.  ABDOMEN:  Soft, flat.  EXTREMITIES:  Without edema.  CERVICAL SPINE:  Without tenderness to palpation. Biceps and brachioradialis  deep tendon reflexes are 2+ bilaterally. Strength is 5/5 in bilateral upper  extremities.   Electrocardiogram reveals sinus rhythm with a heart rate of 57.  Normal axis.  No acute changes.   IMPRESSION:  1. Hypertension, uncontrolled.  2. Neck pain.  3. Gastroesophageal reflux disease with dysphagia and mild odynophagia in      the past.  4. Anxiety depression.  5. History of irritable bowel syndrome and mild diverticular disease.   PLAN:  The patient presents to the office today with complaints of posterior  neck pain. I think these are probably related to a musculoskeletal etiology  and I think seeing Dr. Venetia Maxon next week is a good idea.  Regarding his blood  pressure, I offered to start him on an ACE inhibitor but he declined at this  time. He wants to try diet and exercise first. Will bring him back in about  3-4 weeks to recheck his blood pressure. If it is still elevated at that  time will start him on some Altace.  Tereso Newcomer, PA-C                           Madolyn Frieze. Jens Som, MD, [facility]   SW/MedQ  DD:  01/18/2006  DT:  01/18/2006  Job #:  [id number]   cc:   Nadine Counts

## 2010-09-29 NOTE — Op Note (Signed)
NAMEJAMIER, Alex Castillo                           ACCOUNT NO.:  [id number]   MEDICAL RECORD NO.:  [id number]                   PATIENT TYPE:  INP   LOCATION:  3009                                 FACILITY:  [facility]   PHYSICIAN:  Danae Orleans. Venetia Maxon, M.D.               DATE OF BIRTH:  Nov 24, 1945   DATE OF PROCEDURE:  02/03/2002  DATE OF DISCHARGE:                                 OPERATIVE REPORT   PREOPERATIVE DIAGNOSIS:  Herniated lumbar disk with spondylolysis, L5,  spondylolisthesis at L5-S1, and lumbar radiculopathy, L5-S1.   POSTOPERATIVE DIAGNOSIS:  Herniated lumbar disk with spondylolysis, L5,  spondylolisthesis at L5-S1, and lumbar radiculopathy, L5-S1.   PROCEDURES:  1. L5 Gill procedure.  2. Right L5-S1 diskectomy.  3. Transverse lumbar interbody fusion, L5-S1 (9 mm Synthes bone dowel).  4. Pedicle screw fixation L5 through S1 bilaterally.  5. Posterolateral arthrodesis with morcellized autograft and Vitoss bone     graft supplement.   SURGEON:  Danae Orleans. Venetia Maxon, M.D.   ASSISTANTMarland Kitchen  Mena Goes. Franky Macho, M.D.   ANESTHESIA:  General endotracheal anesthesia.   ESTIMATED BLOOD LOSS:  200 cc.   COMPLICATIONS:  None.   DISPOSITION:  To recovery.   INDICATIONS:  The patient is a 65 year old man with an L5 spondylolysis, an  L5-S1 spondylolisthesis, and L5 radiculopathy with significant foraminal  stenosis and a disk herniation in the foramen at the L5-S1 level on the  right.  It was elected to take him to surgery for lumbar interbody fusion as  well as decompression and posterior fixation.   DESCRIPTION OF PROCEDURE:  The patient was brought to the operating room.  Following the satisfactory and uncomplicated induction of general  endotracheal anesthesia and placement of intravenous lines, the patient was  placed in a prone position on the OR table on chest rolls.  His low back was  shaved, then prepped and draped in the usual sterile fashion.  The area of  planned incision was  infiltrated with 0.25% Marcaine and 0.5% lidocaine and  1:200,000 epinephrine.  The incision was made in the midline, carried  through the subcutaneous tissues to the lumbodorsal fascia, which was  incised bilaterally.  Subperiosteal dissection was performed, exposing the  sacrum, L5, and L4 levels.  An intraoperative x-ray confirmed a marker on  the lamina of L4.  Subsequently the transverse processes of L5 and the  sacral alae were decompressed and exposed, and a self-retaining retractor, a  Versatack retractor, was used to facilitate exposure.  The L5 Gill procedure  was then performed with removal of the posterior elements of L5.  Using  loupe magnification, the S1 nerve root and L5 nerve root were decompressed.  There was significant compression of the L5 nerve root within the foramen on  the right, and this was decompressed.  A similar decompression was done on  the left side.  Subsequently using intraoperative fluoroscopy, pedicle  screws were placed using SD90 pedicle screw system, with 6.75 x 40 mm screws  in the sacrum bilaterally and 45 x 6.75 mm screws in the L5 pedicles.  Positioning of screws was confirmed with palpation in the spinal canal with  coronary dilator with no evidence of cut-out, also confirmed on AP and  lateral fluoroscopy, which showed good positioning of the pedicle screws  within the pedicles.  Using distraction on the left-sided screws, a 40 mm  rod was locked into position in a distracted position, and the interspace  was incised on the right with a 15 blade.  Disk material was removed in a  piecemeal fashion.  The end plates were stripped of disk material, and a  variety of Synthes end plate-preparing instruments were used to remove  residual disk material.  Subsequently the interspace was packed along the  ventralmost aspect with morcellized bone autograft and after initial sizing  and confirmation of sizing of the implant under fluoroscopy, a 9 mm  Synthes  bone graft was then inserted and countersunk appropriately.  Additional  morcellized autograft was then inserted and tamped into position above the  bone graft within the interspace.  Subsequently posterolateral bone graft  was placed from the L5 transverse process to the sacral ala bilaterally, and  then the screws were taken out of distraction and a 40 mm rod was placed  over each screw and end caps were locked into position.  The wound had been  irrigated copiously with bacitracin and saline prior to placing the bone  graft.  The self-retaining retractor was removed.  The lumbodorsal fascia  was reapproximated with 1 Vicryl sutures, the subcutaneous tissue was  reapproximated with 2-0 Vicryl interrupted, inverted sutures, and skin edges  reapproximated with interrupted 3-0 Vicryl subcuticular stitch.  The wound  was dressed with Benzoin and Steri-Strips, Telfa gauze, and tape.  The  patient was extubated in the operating room and taken to the recovery room  in stable and satisfactory condition.  He tolerated his operation well.  Counts were correct at the end of the case.                                                Danae Orleans. Venetia Maxon, M.D.    JDS/MEDQ  D:  02/03/2002  T:  02/04/2002  Job:  [id number]

## 2010-09-29 NOTE — Discharge Summary (Signed)
   NAMEKEVAN, PROUTY                           ACCOUNT NO.:  [id number]   MEDICAL RECORD NO.:  [id number]                   PATIENT TYPE:  INP   LOCATION:  3009                                 FACILITY:  [facility]   PHYSICIAN:  Clydene Fake, M.D.               DATE OF BIRTH:  02-Sep-1945   DATE OF ADMISSION:  02/03/2002  DATE OF DISCHARGE:  02/07/2002                                 DISCHARGE SUMMARY   DISCHARGE DIAGNOSIS:  Herniated nucleus pulposus, spondylosis, and  spondylolisthesis at L5-S1.   PROCEDURE:  L5 Gill decompression and fusion with pedicle screw fixation,  interbody bone.   REASON FOR ADMISSION:  The patient is a 65 year old gentleman who had right  leg pain radiating to his foot and was found to have grade I  spondylolisthesis at L5-S1.  The patient was brought in for decompression  and fusion.   HOSPITAL COURSE:  The patient was admitted to day surgery and underwent the  procedure above without complications.  Postoperatively, the patient was  transferred to the recovery room and then to the floor.  He started getting  up with a brace with good strength in his legs.  No leg pain on the first  postoperative day.  PT was consulted to help with increased ambulation.  He  did have some problems voiding and urology was consulted.  He was started on  Urecholine and Flomax.  On the day of discharge, February 07, 2002, the  Foley catheter was removed and the patient has urinated on a couple of  occasions and the patient was cleared for discharge by urology.  They will  follow him up in a week.  He has continued to improve.  He knows to call  with back and leg problems or increasing pain.  The patient will be  discharged home in stable condition on February 07, 2002.   DISCHARGE MEDICATIONS:  Same as pre hospitalization plus Urecholine, Flomax,  Vicodin p.r.n., and Flexeril p.r.n.   DIET:  As tolerated.   ACTIVITY:  No strenuous activity.  Keep incision dry for five  days.   FOLLOW UP:  In three weeks or sooner with Danae Orleans. Venetia Maxon, M.D.                                               Clydene Fake, M.D.    JRH/MEDQ  D:  02/07/2002  T:  02/10/2002  Job:  [id number]

## 2010-09-29 NOTE — Consult Note (Signed)
Alex Castillo, Alex Castillo                           ACCOUNT NO.:  [id number]   MEDICAL RECORD NO.:  [id number]                   PATIENT TYPE:  INP   LOCATION:  3009                                 FACILITY:  [facility]   PHYSICIAN:  Excell Seltzer. Annabell Howells, M.D.                 DATE OF BIRTH:  12-31-1945   DATE OF CONSULTATION:  DATE OF DISCHARGE:  02/07/2002                                   CONSULTATION   CHIEF COMPLAINT:  Urinary retention.   HISTORY:  The patient is a 65 year old white male who has a history of BPH  without obstruction and who has seen me in the past for the same.  He had  been on Flomax 0.4 mg p.o. q.h.s. but reports he had numerous family crises  in the last several months and has not been as compliant with his medication  as he should have been, and he had noticed some progression of his urinary  symptoms.  He also noted on the review of his admission medications that he  had been on pseudoephedrine 30 mg twice daily, which can certainly aggravate  voiding difficulty.  Following surgery for a herniated disk and spondylosis  at L5 with spondylolithiasis at L5-L1, radiculopathy of L5-S1, he underwent  an L5 Gill repair and a right L5-S1 diskectomy with fixation.  Following  surgery, he has been able to void only small amounts with persistent  discomfort.  He has been on a cath on 2 occasions for 625 cc yesterday, 700  cc today, with only small voids of 1 ounce or so in between.   PAST HISTORY:  SIGNIFICANT FOR ANAPHYLACTIC ALLERGY TO BEE STINGS.   CURRENT MEDICATIONS:  Current medications include Colace, Protonix, Xanax,  Reglan, Flomax, Kefzol, Toradol as needed, Valium as needed, Ambien as  needed, Percocet as needed.   MEDICAL HISTORY':  Significant for diverticulitis, which he had considerable  trouble with through the spring, and reflux.  He also has a history of a  prior bleeding ulcer.   SURGICAL HISTORY:  Significant for nasal surgery in '82; left inguinal  hernia  in '85; right inguinal hernia in '85; anal fissure repair in '98;  esophageal dilations in '98, '99, 2000, and 2001.   SOCIAL HISTORY:  Negative for tobacco or alcohol.   FAMILY HISTORY:  Mother of heart disease and high blood pressure.  Father  died of lung disease.   REVIEW OF SYSTEMS:  On review of systems, he is currently rather weak  following his surgery and in a fair amount of pain.  He denies any  significant constipation or diarrhea at this time, but was having some lower  abdominal discomfort with his inability to urinate.  He is otherwise without  complaints.   EXAMINATION:  I intended to repeat an exam today, but due to the patient's  discomfort and the presence of a back brace, I did  not do that, but his  examination from July of last year demonstrated a benign, relatively small,  1-to-2+ in size prostate without nodules.  Nonpalpable seminal vesicles and  no rectal masses.  He had no findings of inguinal hernia at that time.  GU  exam revealed an unremarkable phallus with adequate meatus.  Scrotum was  unremarkable.  Testicles were unremarkable, as were the epididymides.   IMPRESSION:  1. Benign prostatic hypertrophy and bladder outlet obstruction with     postoperative retention.  2. Recent spinal surgery for L5-S1 disk and bony problems.  3. Noncompliance of Flomax and recent use of pseudoephedrine.   PLAN:  Have the nurses place a Foley and leave it to straight-drain until  Saturday morning, at which time a voiding trial will be given.  He will  continue his Flomax, and I am going to add Urecholine 25 mg p.o. q.i.d. and  obtain a urine specimen as well, with culture.                                               Excell Seltzer. Annabell Howells, M.D.    JJW/MEDQ  D:  02/05/2002  T:  02/08/2002  Job:  [id number]   cc:   Excell Seltzer. Annabell Howells, M.D.   Danae Orleans. Venetia Maxon, M.D.  [address]  [place]  [place] [address]  Fax: [phone]

## 2010-09-29 NOTE — Assessment & Plan Note (Signed)
[facility]                           GASTROENTEROLOGY OFFICE NOTE   HEVER, CASTILLEJA                    MRN:          [id number]  DATE:12/12/2005                            DOB:          04-Mar-1946    Cordale says when he plays tennis which he plays very well and very  hard, he  gets discomfort in his right upper quadrant.  It has been going on for  several years.  He also had some dysphagia, feels all choked up inside but  did talk about this at length and decided that maybe we should let it go a  little bit longer and indicated he needed a script for his AcipHex and we  wrote him for this. I told him he is to try some NuLev.   PHYSICAL EXAMINATION:  GENERAL:  He really did not seem much different.  VITAL SIGNS:  He weighed 224, blood pressure 140/94, pulse 64.  HEENT:  Oropharynx was negative.  NECK:  Negative.  CHEST:  Clear.  HEART:  Regular rhythm without significant murmur.  ABDOMEN:  Soft, no mass or organomegaly.  There was slight pain on palpation  right upper quadrant but it was very muscular.  Rectal was deferred.  EXTREMITIES:  Unremarkable.   IMPRESSION:  1.  Gastroesophageal reflux disease with dysphagia and some mild      odynophagia.  2.  Anxiety and depression.  3.  History of irritable bowel syndrome and mild diverticular disease.  4.  Hypertension.  5.  Right upper quadrant pain probably muscular.   MEDICATIONS:  Toprol XL 25 mg a day, AcipHex, Benefiber, Flomax, Nasacort,  Cialis, Motrin, metoclopramide, Advair, Reglan p.r.n., Xanax p.r.n.   RECOMMENDATIONS:  Try some NuLev p.r.n.  We are going to wait on his  esophagus and only dilate him if he gets worse.  I gave him some AcipHex.                                   Ulyess Mort, MD   SML/MedQ  DD:  12/12/2005  DT:  12/12/2005  Job #:  [id number]

## 2010-09-29 NOTE — Assessment & Plan Note (Signed)
[facility]                           GASTROENTEROLOGY OFFICE NOTE   JAKSON, DELPILAR                    MRN:          [id number]  DATE:02/13/2006                            DOB:          03/18/1946    Alex Castillo comes in still staying he has difficulty swallowing.  He says he thinks  that some of it has to do with stress.  He says the Xanax has helped.  I  really think there is some component of globus hystericus here, chronic  oropharyngeal dysfunction or spasm.  Barium swallow was not really very  definitive, and Romon would like [place] to do an endo dilatation.  In the  meantime, we will keep him on Xanax t.i.d.  I gave him some bacteria for gas  and bloating and told him to try some Reglan one-half a.c. and one h.s.            ______________________________  Ulyess Mort, MD      SML/MedQ  DD:  02/14/2006  DT:  02/15/2006  Job #:  [id number]

## 2010-09-29 NOTE — Assessment & Plan Note (Signed)
 [facility]ALTHCARE                           GASTROENTEROLOGY OFFICE NOTE   MIRACLE, CRIADO                    MRN:          [id number]  DATE:12/31/2005                            DOB:          [birth year]    Alex Castillo comes in, says he is having difficulty swallowing again, had several  major episodes last week where he could not even get the liquids down, and  at any rate it scared him.  He has had some epigastric burning and burping  and belching.  I talked to him.  After a long discussion about symptoms, he  always seemed to have some cricopharyngeal dysfunction or hypertrophic crico  with spasm or some cervical spondylosis that might be impinging on his  esophagus.  I felt like this was the worse problem than anything in the  distal esophagus.  I suggested that we get a barium swallow.  There was a  question also about getting a modified barium swallow, prefer to wait on  that.  There is also some question about some underlying anxiety here.  I  told him to take his Xanax on a consistent basis like for 2-3 a day and not  just when he needed it.  I told him to try some NuLev prior to eating, 1 or  2 to sufficiently give him any relief and the p.o. Protonix 1 or 2 a day to  see if he could get any relief.  I told him we could always dilate him, but  I wanted to see what the x-ray shows and how he does with the above measures  first.  I think he was satisfied with this, and I told him to call if he has  any further difficulty.                                   Ulyess Mort, MD   SML/MedQ  DD:  12/31/2005  DT:  01/01/2006  Job #:  [id number]

## 2010-10-10 ENCOUNTER — Encounter: Payer: Self-pay | Admitting: Physician Assistant

## 2010-10-11 ENCOUNTER — Telehealth: Payer: Self-pay | Admitting: Physician Assistant

## 2010-10-11 NOTE — Telephone Encounter (Signed)
Per pt calling was told he will be set up for blood pressure check- and bring his home monitor in the office.

## 2010-10-12 ENCOUNTER — Ambulatory Visit (INDEPENDENT_AMBULATORY_CARE_PROVIDER_SITE_OTHER): Payer: BC Managed Care – PPO

## 2010-10-12 DIAGNOSIS — I10 Essential (primary) hypertension: Secondary | ICD-10-CM

## 2010-10-12 DIAGNOSIS — Z79899 Other long term (current) drug therapy: Secondary | ICD-10-CM

## 2010-10-12 NOTE — Assessment & Plan Note (Signed)
Agree 

## 2010-10-12 NOTE — Progress Notes (Signed)
Patient in for B/P check and to compare with patient's blood pressure machine. The office B/P left arm 144/83 and 139/78. B/P with pt's machine 146/82 and 148/80. B/P check with pt's wrist machine 132/79. Patient takes Cozaar 50 mg daily. Tereso Newcomer PA recommends for pt.  to stop Cozaar and start Hyzaar 50/12.5 mg by mouth once daily.  And to have BMET blood work in one week after starting medication. Patient has an appointment for blood work on 10/19/10. Patient aware of PA's recommendations and appointment.

## 2010-10-19 ENCOUNTER — Other Ambulatory Visit: Payer: BC Managed Care – PPO | Admitting: *Deleted

## 2010-10-20 ENCOUNTER — Telehealth: Payer: Self-pay | Admitting: Physician Assistant

## 2010-10-23 ENCOUNTER — Ambulatory Visit: Payer: BC Managed Care – PPO | Admitting: Physician Assistant

## 2010-10-27 ENCOUNTER — Ambulatory Visit: Payer: BC Managed Care – PPO | Admitting: Cardiovascular Disease

## 2011-01-01 ENCOUNTER — Ambulatory Visit (INDEPENDENT_AMBULATORY_CARE_PROVIDER_SITE_OTHER): Payer: BC Managed Care – PPO | Admitting: General Surgery

## 2011-02-08 ENCOUNTER — Ambulatory Visit (INDEPENDENT_AMBULATORY_CARE_PROVIDER_SITE_OTHER): Payer: BC Managed Care – PPO | Admitting: General Surgery

## 2011-02-14 ENCOUNTER — Telehealth: Payer: Self-pay | Admitting: Cardiovascular Disease

## 2011-02-14 DIAGNOSIS — I10 Essential (primary) hypertension: Secondary | ICD-10-CM

## 2011-02-14 NOTE — Telephone Encounter (Signed)
Patient calling wants to discuss medication.

## 2011-02-14 NOTE — Telephone Encounter (Signed)
Spoke with pt, pt was started on hyzaar and he never had his follow up blood work done and wants to come by tomorrow for bmp. Orders placed Deliah Goody

## 2011-02-15 ENCOUNTER — Other Ambulatory Visit: Payer: BC Managed Care – PPO | Admitting: *Deleted

## 2011-02-15 DIAGNOSIS — Z79899 Other long term (current) drug therapy: Secondary | ICD-10-CM

## 2011-02-15 DIAGNOSIS — I10 Essential (primary) hypertension: Secondary | ICD-10-CM

## 2011-02-15 LAB — BASIC METABOLIC PANEL
BUN: 14 mg/dL (ref 6–23)
CO2: 31 mEq/L (ref 19–32)
Calcium: 9.3 mg/dL (ref 8.4–10.5)
Chloride: 101 mEq/L (ref 96–112)
Creatinine, Ser: 1 mg/dL (ref 0.4–1.5)
GFR: 82.01 mL/min (ref >60.00)
Glucose, Bld: 82 mg/dL (ref 70–99)
Potassium: 3.5 mEq/L (ref 3.5–5.1)
Sodium: 139 mEq/L (ref 135–145)

## 2011-02-20 NOTE — Progress Notes (Signed)
BMET is fine  I have not seen this guy ? Tereso Newcomer

## 2011-02-23 NOTE — Progress Notes (Signed)
Labs ok Tereso Newcomer, [place]

## 2011-03-14 ENCOUNTER — Ambulatory Visit: Payer: BC Managed Care – PPO | Admitting: Internal Medicine

## 2011-03-15 ENCOUNTER — Encounter: Payer: Self-pay | Admitting: Cardiovascular Disease

## 2011-03-15 ENCOUNTER — Ambulatory Visit (INDEPENDENT_AMBULATORY_CARE_PROVIDER_SITE_OTHER): Payer: BC Managed Care – PPO | Admitting: Cardiovascular Disease

## 2011-03-15 DIAGNOSIS — I10 Essential (primary) hypertension: Secondary | ICD-10-CM | POA: Insufficient documentation

## 2011-03-15 DIAGNOSIS — R002 Palpitations: Secondary | ICD-10-CM

## 2011-03-15 NOTE — Assessment & Plan Note (Signed)
Resolved Active Observe

## 2011-03-15 NOTE — Assessment & Plan Note (Signed)
Improved.  Home BP cuff matches ours  Home readings are good.  Some white coat component.  BMET q 6 months

## 2011-03-15 NOTE — Patient Instructions (Signed)
Your physician wants you to follow-up in: 1 year. You will receive a reminder letter in the mail two months in advance. If you don't receive a letter, please call our office to schedule the follow-up appointment.  

## 2011-03-15 NOTE — Progress Notes (Signed)
Alex Castillo returns for followup of his hypertension and palpitations. He doing well. Palpitations resolved. He is active playing tennis and doing elliptical training. His blood pressure seems to be under reasonable control  Since on Hyzaar 50/12.5  Last BMET ok with K3.5 . Diet is somewhat poor. He can do a better job with sodium and carbohydrates. He has been compliant with his medications. Is not having significant headache palpitations PND or orthopnea no chest pain no evidence of vascular disease claudication TIA or CVA.   He will come back to get a fasting lipid and liver profile. He needs a new primary as Sid Harrison Mons is apparantly doing hospitalist work now   ROS: Denies fever, malais, weight loss, blurry vision, decreased visual acuity, cough, sputum, SOB, hemoptysis, pleuritic pain, palpitaitons, heartburn, abdominal pain, melena, lower extremity edema, claudication, or rash.  All other systems reviewed and negative  General: Affect appropriate Healthy:  appears stated age HEENT: normal Neck supple with no adenopathy JVP normal no bruits no thyromegaly Lungs clear with no wheezing and good diaphragmatic motion Heart:  S1/S2 no murmur,rub, gallop or click PMI normal Abdomen: benighn, BS positve, no tenderness, no AAA no bruit.  No HSM or HJR Distal pulses intact with no bruits No edema Neuro non-focal Skin warm and dry No muscular weakness   Current Outpatient Prescriptions  Medication Sig Dispense Refill  . ALPRAZolam (XANAX) 0.25 MG tablet Take 0.25 mg by mouth at bedtime as needed.        . Ascorbic Acid (VITAMIN C) 1000 MG tablet Take 1,000 mg by mouth daily.        [name] aspirin 81 MG tablet Take 81 mg by mouth daily.        . fish oil-omega-3 fatty acids 1000 MG capsule Take 2 g by mouth daily.        [name] ibuprofen (ADVIL,MOTRIN) 200 MG tablet Take 200 mg by mouth every 6 (six) hours as needed.        [name] losartan-hydrochlorothiazide (HYZAAR) 50-12.5 MG per tablet Take 1 tablet by mouth  daily.  30 tablet  11  . Omeprazole-Sodium Bicarbonate (ZEGERID PO) Take 40 mg by mouth.        . tadalafil (CIALIS) 10 MG tablet Take 10 mg by mouth daily as needed.        . Tamsulosin HCl (FLOMAX) 0.4 MG CAPS Take 0.4 mg by mouth daily.        . vitamin E 200 UNIT capsule Take 200 Units by mouth daily.          Allergies  Bee and Hydromorphone hcl  Electrocardiogram:  Assessment and Plan

## 2011-03-20 ENCOUNTER — Ambulatory Visit (INDEPENDENT_AMBULATORY_CARE_PROVIDER_SITE_OTHER): Payer: BC Managed Care – PPO | Admitting: Internal Medicine

## 2011-03-20 ENCOUNTER — Encounter: Payer: Self-pay | Admitting: Internal Medicine

## 2011-03-20 DIAGNOSIS — K589 Irritable bowel syndrome without diarrhea: Secondary | ICD-10-CM

## 2011-03-20 DIAGNOSIS — Z86010 Personal history of colonic polyps: Secondary | ICD-10-CM

## 2011-03-20 DIAGNOSIS — K219 Gastro-esophageal reflux disease without esophagitis: Secondary | ICD-10-CM

## 2011-03-20 DIAGNOSIS — R1319 Other dysphagia: Secondary | ICD-10-CM

## 2011-03-20 NOTE — Assessment & Plan Note (Signed)
Alinda Money and I discussed this; it's always been hard for me to understand exactly where his problems are coming from. I did reassure him that think it is extremely unlikely that his problems represent esophageal cancer and he admitted that his daughter-in-law's problems and recent death from that are probably related to his recent concerns. I explained that repeated dilations are an option but I would like to try to minimize those. He will watch and wait and see how things go. Xanax does help so there is an anxiety component to this. He will maintain his current GERD regimen. He will call if things don't seem to improve over the next month and we can consider a repeat dilation. I did discuss the possibility of esophageal manometry and barium swallow and he is reluctant to pursue these.

## 2011-03-20 NOTE — Patient Instructions (Signed)
Wait a few more weeks to see your swallowing does. If it gets worse or fail to improve call back in my nurse note. Please get a flu shot through your primary care physician or a pharmacy before the end of November.

## 2011-03-20 NOTE — Assessment & Plan Note (Signed)
No reported problems with this today.

## 2011-03-20 NOTE — Assessment & Plan Note (Signed)
Currently heartburn is controlled on a combination of an over-the-counter Zegerid and an over-the-counter omeprazole to give 40 mg total omeprazole. I advised to continue that at this time. I don't think he needs an increased dose. His occasional belching and regurgitation all be monitored at this time for any changes.

## 2011-03-20 NOTE — Progress Notes (Addendum)
[facility]  Alex Castillo [id number] 1946/05/12   HPI: Nikko returns with complaints of swallowing problems again. He has had recurrent problems with vague dysphagia. He is now reporting intermittent pressure and belching and regurgitation. I cannot get a history for straightforward dysphagia at today. He says his throat does feel tight at times. This is been going on for about a month. He has a known esophageal dysmotility issue or at least suspected. He has had periodic Maloney dilations over the years with some relief. No discrete stricture has ever been seen. He does relate that his father-in-law recently died from esophageal cancer and he has some concern over developing and distally does not think he has it now. He relates that he is retired and the last year and is working out in exercising at [employer] and he is not having any significant neck pain problems and overall feels well. He tried to climb Azactam but did not feel well on that so switched back to Xanax which she is using intermittently in a very low dose of 0.25 mg. When he takes that he has less swallowing difficulty. Outpatient Prescriptions Prior to Visit  Medication Sig Dispense Refill  . Ascorbic Acid (VITAMIN C) 1000 MG tablet Take 1,000 mg by mouth daily.        [name] aspirin 81 MG tablet Take 81 mg by mouth daily.        . fish oil-omega-3 fatty acids 1000 MG capsule Take 2 g by mouth daily.        [name] ibuprofen (ADVIL,MOTRIN) 200 MG tablet Take 200 mg by mouth every 6 (six) hours as needed.        [name] losartan-hydrochlorothiazide (HYZAAR) 50-12.5 MG per tablet Take 1 tablet by mouth daily.  30 tablet  11  . Omeprazole-Sodium Bicarbonate (ZEGERID PO) Take 40 mg by mouth. Using 20/1100 OTC Zegerid and 20 mg omeprazole OTC      . tadalafil (CIALIS) 10 MG tablet Take 10 mg by mouth daily as needed.        . Tamsulosin HCl (FLOMAX) 0.4 MG CAPS Take 0.4 mg by mouth daily.        . vitamin E 200 UNIT capsule Take 200  Units by mouth daily.        [name] ALPRAZolam (XANAX) 0.25 MG tablet Take 0.25 mg by mouth at bedtime as needed.         Past Medical History  Diagnosis Date  . HTN (hypertension)     ETT negative 10/08  . Palpitations   . First degree AV block   . IBS (irritable bowel syndrome)   . Anxiety   . GERD (gastroesophageal reflux disease)   . ED (erectile dysfunction)   . BPH (benign prostatic hyperplasia)   . Diverticulosis   . Adenomatous polyps   . Hiatal hernia   . Internal hemorrhoids    Past Surgical History  Procedure Date  . Right foot surgery   . Bilateral inguinal hernia repair   . Nasl septoplasty   . Lower back surgery with lumbar vertebral repair   . Colonoscopy w/ biopsies and polypectomy 09/15/2008    adonomatous polyps, diverticulosis, internal hemorrhoids  . Upper gastrointestinal endoscopy 02/07/2010    w/dil, tourtuous esophagus, hiatal hernia    reports that he has never smoked. He does not have any smokeless tobacco history on file. He reports that he does not drink alcohol or use illicit drugs. family history includes Arthritis in his father; Asthma  in his maternal grandfather; Heart disease in his father; Hypertension in his father; and Lung cancer in his father. Allergies  Allergen Reactions  . Bee Anaphylaxis  . Hydromorphone Hcl           PE: Filed Vitals:   03/20/11 1548  BP: 118/72  Pulse: 60       Assessment/Plan:

## 2011-05-17 ENCOUNTER — Telehealth: Payer: Self-pay | Admitting: Cardiovascular Disease

## 2011-05-17 MED ORDER — HYDROCHLOROTHIAZIDE 12.5 MG PO TABS: 12.5000 mg | ORAL_TABLET | Freq: Every day | ORAL | Status: DC

## 2011-05-17 MED ORDER — LOSARTAN POTASSIUM 50 MG PO TABS: 50.0000 mg | ORAL_TABLET | Freq: Every day | ORAL | Status: DC

## 2011-05-17 NOTE — Telephone Encounter (Signed)
SPOKE WITH PT   CALLED REFILLS IN  TO [facility]   AT PT'S REQUEST  TO TAKE LOSARTAN  50 MG AND  HCT 12.5 MG  SEPARATE ./CY

## 2011-05-17 NOTE — Telephone Encounter (Signed)
New msg Pt is taking hyzaar he wants to get in two separate pills. The hctz makes him go to bathroom a lot and he wants to take them at separate times Please call

## 2011-07-10 ENCOUNTER — Other Ambulatory Visit: Payer: Self-pay | Admitting: Internal Medicine

## 2011-07-10 NOTE — Telephone Encounter (Signed)
The request is different from Rx on chart (qhs prn).  Please clarify with the patient how he is using this.

## 2011-07-10 NOTE — Telephone Encounter (Signed)
Dr. Leone Payor do you want to refill this medication for this patient?

## 2011-07-11 NOTE — Telephone Encounter (Signed)
Left a message on patient's voice mail to return my call. 

## 2011-07-13 NOTE — Telephone Encounter (Signed)
Called and left a message on patient's voicemail for the patient to return my call.

## 2011-07-19 ENCOUNTER — Telehealth: Payer: Self-pay | Admitting: Internal Medicine

## 2011-07-19 MED ORDER — ALPRAZOLAM 0.25 MG PO TABS: ORAL_TABLET | ORAL | Status: DC

## 2011-07-19 NOTE — Telephone Encounter (Signed)
Dr. Leone Payor a refill request was sent on the patient concerning his Xanax when I was handling this and you wanted me to find out how the patient was taking the medication, see 07/10/11 refill note. Patient finally returned my call. Patient stated that medication has always been written for 1/2-1 tablet TID and he only gets it filled about every three month because he only takes the medication at night when needed. So whatever way you want to refll the medication is fine with him. Either 1/2-1 tid # 90 or 1 qhs # 30 is ok. Dennie Bible also stated that he is starting to have some reflux and feels like he is getting strangled at time but he stated that he would call back and schedule an appointment for this.

## 2011-07-19 NOTE — Telephone Encounter (Signed)
Ok to refill it 1 tid prn # 90 with 3 refills

## 2011-07-19 NOTE — Telephone Encounter (Signed)
ok'ed per Dr. Leone Payor medication refilled.

## 2012-03-31 ENCOUNTER — Ambulatory Visit: Payer: BC Managed Care – PPO | Admitting: Cardiovascular Disease

## 2012-04-14 ENCOUNTER — Ambulatory Visit (INDEPENDENT_AMBULATORY_CARE_PROVIDER_SITE_OTHER): Payer: BC Managed Care – PPO | Admitting: Cardiovascular Disease

## 2012-04-14 ENCOUNTER — Encounter: Payer: Self-pay | Admitting: Cardiovascular Disease

## 2012-04-14 VITALS — BP 135/78 | HR 84 | Ht 70.0 in | Wt 227.0 lb

## 2012-04-14 DIAGNOSIS — M129 Arthropathy, unspecified: Secondary | ICD-10-CM

## 2012-04-14 DIAGNOSIS — R002 Palpitations: Secondary | ICD-10-CM

## 2012-04-14 MED ORDER — LOSARTAN POTASSIUM 100 MG PO TABS: 100.0000 mg | ORAL_TABLET | Freq: Every day | ORAL | Status: DC

## 2012-04-14 NOTE — Assessment & Plan Note (Signed)
F/U primary dont think meds making it worse although diuretic might and he will stop this.  Avoid excessive NSAI's

## 2012-04-14 NOTE — Progress Notes (Signed)
Patient ID: Alex Castillo, male   DOB: Apr 03, 1946, 66 y.o.   MRN: [id number] Alex Castillo returns for followup of his hypertension and palpitations. He doing well. Palpitations resolved. He is active playing tennis and doing elliptical training. His blood pressure seems to be under reasonable control Since on Hyzaar 50/12.5 Last BMET ok with K3.5 . Diet is somewhat poor. He can do a better job with sodium and carbohydrates. He has been compliant with his medications. Is not having significant headache palpitations PND or orthopnea no chest pain no evidence of vascular disease claudication TIA or CVA.   Hates diuretic  Makes him feel weak, poor sex drive and "arthritis" worse.    ROS: Denies fever, malais, weight loss, blurry vision, decreased visual acuity, cough, sputum, SOB, hemoptysis, pleuritic pain, palpitaitons, heartburn, abdominal pain, melena, lower extremity edema, claudication, or rash.  All other systems reviewed and negative  General: Affect appropriate Healthy:  appears stated age HEENT: normal Neck supple with no adenopathy JVP normal no bruits no thyromegaly Lungs clear with no wheezing and good diaphragmatic motion Heart:  S1/S2 no murmur, no rub, gallop or click PMI normal Abdomen: benighn, BS positve, no tenderness, no AAA no bruit.  No HSM or HJR Distal pulses intact with no bruits No edema Neuro non-focal Skin warm and dry No muscular weakness   Current Outpatient Prescriptions  Medication Sig Dispense Refill  . ALPRAZolam (XANAX) 0.25 MG tablet Take 1/2-1 tablet by mouth three times a day as needed.  90 tablet  3  . Ascorbic Acid (VITAMIN C) 1000 MG tablet Take 1,000 mg by mouth daily.        [name] aspirin 81 MG tablet Take 81 mg by mouth daily.        . Cholecalciferol (VITAMIN D-3) 5000 UNITS TABS Take 1 tablet by mouth. Take 1 tab daily       . fish oil-omega-3 fatty acids 1000 MG capsule Take 2 g by mouth daily.        . hydrochlorothiazide (HYDRODIURIL) 12.5 MG tablet  Take 1 tablet (12.5 mg total) by mouth daily.  30 tablet  11  . ibuprofen (ADVIL,MOTRIN) 200 MG tablet Take 200 mg by mouth every 6 (six) hours as needed.        [name] losartan (COZAAR) 50 MG tablet Take 1 tablet (50 mg total) by mouth daily.  30 tablet  11  . Omeprazole-Sodium Bicarbonate (ZEGERID PO) Take 40 mg by mouth. Using 20/1100 OTC Zegerid and 20 mg omeprazole OTC      . tadalafil (CIALIS) 10 MG tablet Take 10 mg by mouth daily as needed.        . Tamsulosin HCl (FLOMAX) 0.4 MG CAPS Take 0.4 mg by mouth daily.        . vitamin E 200 UNIT capsule Take 200 Units by mouth daily.          Allergies  Nutritional supplements and Hydromorphone hcl  Electrocardiogram:  Assessment and Plan

## 2012-04-14 NOTE — Addendum Note (Signed)
Addended by: Early Chars on: 04/14/2012 12:41 PM   Modules accepted: Orders

## 2012-04-14 NOTE — Assessment & Plan Note (Signed)
Stop diuretic increase cozaar to 100mg   F/U primary

## 2012-04-14 NOTE — Assessment & Plan Note (Signed)
Resolved no evidence of structural heart disease  

## 2012-04-14 NOTE — Patient Instructions (Addendum)
Your physician wants you to follow-up in: YEAR WITH DR Haywood Filler will receive a reminder letter in the mail two months in advance. If you don't receive a letter, please call our office to schedule the follow-up appointment. Your physician has recommended you make the following change in your medication: STOP HCTZ INCREASE  LOSARTAN TO 100 MG EVERY DAY

## 2012-06-11 ENCOUNTER — Telehealth: Payer: Self-pay | Admitting: Cardiovascular Disease

## 2012-06-11 MED ORDER — LOSARTAN POTASSIUM 100 MG PO TABS: 100.0000 mg | ORAL_TABLET | Freq: Every day | ORAL | Status: DC

## 2012-06-11 NOTE — Telephone Encounter (Signed)
Pt needs refill of cozaar , pls FAX  into [facility] [phone]

## 2012-09-15 ENCOUNTER — Telehealth: Payer: Self-pay | Admitting: Internal Medicine

## 2012-09-15 NOTE — Telephone Encounter (Signed)
Patient c/o reflux and that he needs to reschedule for an earlier appt.  He will come 09/24/12 10:15 due to epigastric pain

## 2012-09-24 ENCOUNTER — Ambulatory Visit (INDEPENDENT_AMBULATORY_CARE_PROVIDER_SITE_OTHER): Payer: Medicare Other | Admitting: Internal Medicine

## 2012-09-24 ENCOUNTER — Encounter: Payer: Self-pay | Admitting: Internal Medicine

## 2012-09-24 VITALS — BP 130/80 | HR 68 | Ht 71.0 in | Wt 230.6 lb

## 2012-09-24 DIAGNOSIS — R0789 Other chest pain: Secondary | ICD-10-CM

## 2012-09-24 DIAGNOSIS — K219 Gastro-esophageal reflux disease without esophagitis: Secondary | ICD-10-CM | POA: Diagnosis not present

## 2012-09-24 DIAGNOSIS — R071 Chest pain on breathing: Secondary | ICD-10-CM | POA: Diagnosis not present

## 2012-09-24 DIAGNOSIS — F411 Generalized anxiety disorder: Secondary | ICD-10-CM | POA: Diagnosis not present

## 2012-09-24 DIAGNOSIS — F419 Anxiety disorder, unspecified: Secondary | ICD-10-CM

## 2012-09-24 MED ORDER — OMEPRAZOLE-SODIUM BICARBONATE 40-1100 MG PO CAPS: 1.0000 | ORAL_CAPSULE | Freq: Every day | ORAL | Status: DC

## 2012-09-24 MED ORDER — ALPRAZOLAM 0.25 MG PO TABS: ORAL_TABLET | ORAL | Status: DC

## 2012-09-24 NOTE — Progress Notes (Signed)
  Subjective:    Patient ID: Alex Castillo, male    DOB: [birth year], 66 y.o.   MRN: [id number]  HPI Some left chest wall pain Some regurgitation lately Had been on OTC Zegerid but was not working as well Wife got double Rx Zegerid generic Rx and he says that has been helping more No dysphagia  Medications, allergies, past medical history, past surgical history, family history and social history are reviewed and updated in the EMR.  Review of Systems As above    Objective:   Physical Exam General:  NAD Chest wall: nontender Abdomen:  soft and nontender, BS+     Assessment & Plan:  GERD (gastroesophageal reflux disease)  Anxiety  Chest wall pain  1. Continue PPI - try to go back to Zegerid RX - RX provided 2. Reassurance 3. Refill alprazolam - uses it rarely and helps with dysphagia and upper GI sxs 4. REV 1 year, sooner prn  Cc:EYK, Michel Santee, MD

## 2012-09-24 NOTE — Patient Instructions (Addendum)
Today you have been given written rx's for Xanax and Zegerid.  Also we are giving you samples of Zegerid to use.   Thank you for choosing me and [facility].  Iva Boop, M.D., [facility]  Samples given to patient    Lot# [id number]                  Exp.  11/2014                Amount #20

## 2012-10-01 DIAGNOSIS — I10 Essential (primary) hypertension: Secondary | ICD-10-CM | POA: Diagnosis not present

## 2012-10-02 DIAGNOSIS — T6391XA Toxic effect of contact with unspecified venomous animal, accidental (unintentional), initial encounter: Secondary | ICD-10-CM | POA: Diagnosis not present

## 2012-10-08 ENCOUNTER — Ambulatory Visit: Payer: BC Managed Care – PPO | Admitting: Internal Medicine

## 2012-11-13 DIAGNOSIS — T6391XA Toxic effect of contact with unspecified venomous animal, accidental (unintentional), initial encounter: Secondary | ICD-10-CM | POA: Diagnosis not present

## 2012-11-17 ENCOUNTER — Other Ambulatory Visit: Payer: Self-pay | Admitting: Internal Medicine

## 2012-11-17 NOTE — Telephone Encounter (Signed)
Left message for patient to call me back.  We gave him a printed rx in May for # 90 and 3 refills.

## 2012-11-18 NOTE — Telephone Encounter (Signed)
Spoke to patient he states he never got the xanax or zegerid rx's filled from his May 2014 visit.  They were written and he said he must have misplaced them.  May I resend refills to [facility] [address]  [facility] says per Alinda Money they can't get the Zegerid strength he needs.

## 2012-11-19 NOTE — Telephone Encounter (Signed)
Left message that we can refill his medicines  (xanax, zegerid), I just want to know which drug store he wants them sent to:  [facility] [address] [place].

## 2012-11-20 ENCOUNTER — Other Ambulatory Visit: Payer: Self-pay | Admitting: Internal Medicine

## 2012-11-20 ENCOUNTER — Other Ambulatory Visit: Payer: Self-pay

## 2012-11-20 MED ORDER — OMEPRAZOLE-SODIUM BICARBONATE 40-1100 MG PO CAPS: 1.0000 | ORAL_CAPSULE | Freq: Every day | ORAL | Status: DC

## 2012-11-20 NOTE — Telephone Encounter (Signed)
Spoke to patient and confirmed that [facility] is correct drug store, rx for xanax and zegerid will be resent.

## 2012-12-06 ENCOUNTER — Encounter (HOSPITAL_COMMUNITY): Payer: Self-pay | Admitting: Emergency Medicine

## 2012-12-06 ENCOUNTER — Emergency Department (HOSPITAL_COMMUNITY): Payer: Medicare Other

## 2012-12-06 ENCOUNTER — Emergency Department (HOSPITAL_COMMUNITY)
Admission: EM | Admit: 2012-12-06 | Discharge: 2012-12-07 | Disposition: A | Discharge status: Home / Self Care | Payer: Medicare Other | Source: Home / Self Care | Attending: Emergency Medicine | Admitting: Emergency Medicine

## 2012-12-06 ENCOUNTER — Emergency Department (HOSPITAL_COMMUNITY)
Admission: EM | Admit: 2012-12-06 | Discharge: 2012-12-06 | Disposition: A | Discharge status: Home / Self Care | Payer: Medicare Other | Source: Home / Self Care | Attending: Emergency Medicine | Admitting: Emergency Medicine

## 2012-12-06 DIAGNOSIS — N1330 Unspecified hydronephrosis: Secondary | ICD-10-CM | POA: Diagnosis not present

## 2012-12-06 DIAGNOSIS — I440 Atrioventricular block, first degree: Secondary | ICD-10-CM | POA: Insufficient documentation

## 2012-12-06 DIAGNOSIS — Z860100 Personal history of colon polyps, unspecified: Secondary | ICD-10-CM | POA: Insufficient documentation

## 2012-12-06 DIAGNOSIS — N200 Calculus of kidney: Secondary | ICD-10-CM | POA: Diagnosis not present

## 2012-12-06 DIAGNOSIS — K589 Irritable bowel syndrome without diarrhea: Secondary | ICD-10-CM | POA: Insufficient documentation

## 2012-12-06 DIAGNOSIS — K5730 Diverticulosis of large intestine without perforation or abscess without bleeding: Secondary | ICD-10-CM | POA: Diagnosis not present

## 2012-12-06 DIAGNOSIS — Z79899 Other long term (current) drug therapy: Secondary | ICD-10-CM | POA: Insufficient documentation

## 2012-12-06 DIAGNOSIS — R911 Solitary pulmonary nodule: Secondary | ICD-10-CM | POA: Diagnosis not present

## 2012-12-06 DIAGNOSIS — N201 Calculus of ureter: Principal | ICD-10-CM | POA: Insufficient documentation

## 2012-12-06 DIAGNOSIS — R112 Nausea with vomiting, unspecified: Secondary | ICD-10-CM | POA: Diagnosis not present

## 2012-12-06 DIAGNOSIS — Z7982 Long term (current) use of aspirin: Secondary | ICD-10-CM | POA: Insufficient documentation

## 2012-12-06 DIAGNOSIS — Z8679 Personal history of other diseases of the circulatory system: Secondary | ICD-10-CM | POA: Insufficient documentation

## 2012-12-06 DIAGNOSIS — Z87448 Personal history of other diseases of urinary system: Secondary | ICD-10-CM | POA: Insufficient documentation

## 2012-12-06 DIAGNOSIS — K219 Gastro-esophageal reflux disease without esophagitis: Secondary | ICD-10-CM | POA: Insufficient documentation

## 2012-12-06 DIAGNOSIS — R1013 Epigastric pain: Secondary | ICD-10-CM | POA: Insufficient documentation

## 2012-12-06 DIAGNOSIS — K4090 Unilateral inguinal hernia, without obstruction or gangrene, not specified as recurrent: Secondary | ICD-10-CM | POA: Diagnosis not present

## 2012-12-06 DIAGNOSIS — R110 Nausea: Secondary | ICD-10-CM | POA: Diagnosis not present

## 2012-12-06 DIAGNOSIS — I10 Essential (primary) hypertension: Secondary | ICD-10-CM | POA: Insufficient documentation

## 2012-12-06 DIAGNOSIS — Z8719 Personal history of other diseases of the digestive system: Secondary | ICD-10-CM | POA: Insufficient documentation

## 2012-12-06 DIAGNOSIS — F411 Generalized anxiety disorder: Secondary | ICD-10-CM | POA: Insufficient documentation

## 2012-12-06 DIAGNOSIS — R918 Other nonspecific abnormal finding of lung field: Secondary | ICD-10-CM

## 2012-12-06 DIAGNOSIS — Z885 Allergy status to narcotic agent status: Secondary | ICD-10-CM | POA: Insufficient documentation

## 2012-12-06 DIAGNOSIS — K449 Diaphragmatic hernia without obstruction or gangrene: Secondary | ICD-10-CM | POA: Insufficient documentation

## 2012-12-06 DIAGNOSIS — R002 Palpitations: Secondary | ICD-10-CM | POA: Insufficient documentation

## 2012-12-06 DIAGNOSIS — J984 Other disorders of lung: Secondary | ICD-10-CM | POA: Diagnosis not present

## 2012-12-06 DIAGNOSIS — N400 Enlarged prostate without lower urinary tract symptoms: Secondary | ICD-10-CM | POA: Insufficient documentation

## 2012-12-06 DIAGNOSIS — R109 Unspecified abdominal pain: Secondary | ICD-10-CM | POA: Insufficient documentation

## 2012-12-06 DIAGNOSIS — N529 Male erectile dysfunction, unspecified: Secondary | ICD-10-CM | POA: Insufficient documentation

## 2012-12-06 DIAGNOSIS — Z01812 Encounter for preprocedural laboratory examination: Secondary | ICD-10-CM | POA: Insufficient documentation

## 2012-12-06 LAB — URINALYSIS, ROUTINE W REFLEX MICROSCOPIC
Bilirubin Urine: NEGATIVE
Glucose, UA: 250 mg/dL — AB
Ketones, ur: NEGATIVE mg/dL
Nitrite: NEGATIVE
Protein, ur: NEGATIVE mg/dL
Specific Gravity, Urine: 1.019 (ref 1.005–1.030)
Urobilinogen, UA: 0.2 mg/dL (ref 0.0–1.0)
pH: 5.5 (ref 5.0–8.0)

## 2012-12-06 LAB — URINE MICROSCOPIC-ADD ON

## 2012-12-06 LAB — GLUCOSE, CAPILLARY: Glucose-Capillary: 100 mg/dL — ABNORMAL HIGH (ref 70–99)

## 2012-12-06 MED ORDER — OXYCODONE-ACETAMINOPHEN 5-325 MG PO TABS: 2.0000 | ORAL_TABLET | ORAL | Status: DC | PRN

## 2012-12-06 MED ORDER — KETOROLAC TROMETHAMINE 30 MG/ML IJ SOLN
30.0000 mg | Freq: Once | INTRAMUSCULAR | Status: AC
  Administered 2012-12-06: 30 mg via INTRAVENOUS
  Filled 2012-12-06: qty 1

## 2012-12-06 MED ORDER — PROMETHAZINE HCL 25 MG PO TABS: 25.0000 mg | ORAL_TABLET | Freq: Four times a day (QID) | ORAL | Status: DC | PRN

## 2012-12-06 MED ORDER — SODIUM CHLORIDE 0.9 % IV SOLN
Freq: Once | INTRAVENOUS | Status: AC
  Administered 2012-12-06: 125 mL/h via INTRAVENOUS

## 2012-12-06 MED ORDER — ONDANSETRON HCL 4 MG/2ML IJ SOLN
4.0000 mg | Freq: Once | INTRAMUSCULAR | Status: AC
  Administered 2012-12-06: 4 mg via INTRAVENOUS
  Filled 2012-12-06: qty 2

## 2012-12-06 NOTE — ED Notes (Signed)
Pt drinking water and will attempt to give urine sample .

## 2012-12-06 NOTE — ED Provider Notes (Signed)
CSN: [id number]     Arrival date & time 12/06/12  2207 History     First MD Initiated Contact with Patient 12/06/12 2311     Chief Complaint  Patient presents with  . Nephrolithiasis   (Consider location/radiation/quality/duration/timing/severity/associated sxs/prior Treatment) HPI  Patient is a 67 year old male presenting to the emergency department again today after discharge earlier with diagnosis of left nephrolithiasis. Patient states he was symptom-free upon discharge and a few hours later the pain came back without change in his symptoms prior to initial visit. Patient states he took two Percocets and Phenergan at once. After taking those pills he developed some generalized epigastric discomfort, nausea, vomiting. Patient states he does have a history of intolerance to pain medications. Patient states his pain is not controlled due to the inability to keep anything down since nausea and vomiting started. No new complaints aside from nausea and vomiting, left flank pain remains unchanged from initial visit.    Past Medical History  Diagnosis Date  . HTN (hypertension)     ETT negative 10/08  . Palpitations   . First degree AV block   . IBS (irritable bowel syndrome)   . Anxiety   . GERD (gastroesophageal reflux disease)   . ED (erectile dysfunction)   . BPH (benign prostatic hyperplasia)   . Diverticulosis   . Adenomatous polyps   . Hiatal hernia   . Internal hemorrhoids    Past Surgical History  Procedure Laterality Date  . Right foot surgery    . Bilateral inguinal hernia repair    . Nasl septoplasty    . Lower back surgery with lumbar vertebral repair    . Colonoscopy w/ biopsies and polypectomy  09/15/2008    adonomatous polyps, diverticulosis, internal hemorrhoids  . Upper gastrointestinal endoscopy  02/07/2010    w/dil, tourtuous esophagus, hiatal hernia   Family History  Problem Relation Age of Onset  . Lung cancer Father   . Heart disease Father   .  Hypertension Father   . Arthritis Father   . Asthma Maternal Grandfather    History  Substance Use Topics  . Smoking status: Never Smoker   . Smokeless tobacco: Never Used  . Alcohol Use: No    Review of Systems  Respiratory: Negative for shortness of breath.   Cardiovascular: Negative for chest pain.  Gastrointestinal: Positive for nausea, vomiting and abdominal pain.  Genitourinary: Positive for flank pain.  All other systems reviewed and are negative.    Allergies  Bee venom and Hydromorphone hcl  Home Medications   Current Outpatient Rx  Name  Route  Sig  Dispense  Refill  . aspirin 81 MG tablet   Oral   Take 81 mg by mouth daily.           . clonazePAM (KLONOPIN) 0.5 MG tablet   Oral   Take 0.25 mg by mouth at bedtime as needed for anxiety.         . CO ENZYME Q-10 PO   Oral   Take 1 capsule by mouth daily.         [name] EPINEPHrine (EPI-PEN) 0.3 mg/0.3 mL SOAJ   Intramuscular   Inject 0.3 mg into the muscle once.         . fish oil-omega-3 fatty acids 1000 MG capsule   Oral   Take 2 g by mouth daily.           [name] ibuprofen (ADVIL,MOTRIN) 200 MG tablet   Oral   Take  200 mg by mouth every 6 (six) hours as needed for pain.          [name] losartan (COZAAR) 100 MG tablet   Oral   Take 1 tablet (100 mg total) by mouth daily.   90 tablet   3   . Multiple Vitamins-Minerals (CENTRUM SILVER ADULT 50+ PO)   Oral   Take 1 tablet by mouth daily.         [name] omeprazole-sodium bicarbonate (ZEGERID) 40-1100 MG per capsule   Oral   Take 1 capsule by mouth daily before breakfast.   30 capsule   11     90 day fill also ok, 3 refills   . oxyCODONE-acetaminophen (PERCOCET) 5-325 MG per tablet   Oral   Take 2 tablets by mouth every 4 (four) hours as needed for pain.   20 tablet   0   . promethazine (PHENERGAN) 25 MG tablet   Oral   Take 1 tablet (25 mg total) by mouth every 6 (six) hours as needed for nausea.   15 tablet   0   . tadalafil (CIALIS)  10 MG tablet   Oral   Take 5 mg by mouth daily as needed for erectile dysfunction.          . Tamsulosin HCl (FLOMAX) 0.4 MG CAPS   Oral   Take 0.4 mg by mouth daily.           . vitamin E 200 UNIT capsule   Oral   Take 400 Units by mouth daily.          . Wheat Dextrin (BENEFIBER PO)   Oral   Take 2 each by mouth daily.           BP 156/85  Pulse 60  Temp(Src) 98.5 F (36.9 C) (Oral)  Resp 18  SpO2 98% Physical Exam  Constitutional: He is oriented to person, place, and time. He appears well-developed and well-nourished.  HENT:  Head: Normocephalic and atraumatic.  Eyes: Conjunctivae are normal.  Cardiovascular: Normal rate, regular rhythm and normal heart sounds.   Pulmonary/Chest: Effort normal and breath sounds normal.  Abdominal: Soft. Bowel sounds are normal. He exhibits no distension. There is tenderness in the epigastric area. There is no rigidity, no rebound, no guarding and no CVA tenderness.  Mild epigastric tenderness  Neurological: He is alert and oriented to person, place, and time.  Skin: Skin is warm and dry.  Psychiatric: He has a normal mood and affect.    ED Course   Procedures (including critical care time)  Medications  0.9 %  sodium chloride infusion (125 mL/hr Intravenous New Bag/Given 12/06/12 2353)  ondansetron (ZOFRAN) injection 4 mg (4 mg Intravenous Given 12/06/12 2353)  ketorolac (TORADOL) 30 MG/ML injection 30 mg (30 mg Intravenous Given 12/06/12 2353)     Labs Reviewed - No data to display Ct Abdomen Pelvis Wo Contrast  12/06/2012   *RADIOLOGY REPORT*  Clinical Data: Left-sided flank pain.  Evaluate for potential nephrolithiasis.  CT ABDOMEN AND PELVIS WITHOUT CONTRAST  Technique:  Multidetector CT imaging of the abdomen and pelvis was performed following the standard protocol without intravenous contrast.  Comparison: No priors.  Findings:  Lung Bases: 4 mm right middle lobe nodule (image two of series three).  A 3 mm left lower  lobe nodule (image 11 of series 3). Small hiatal hernia.  Abdomen/Pelvis:  Image 77 of series 2 demonstrates a 3 mm calculus in the distal third of the left ureter  shortly before the left ureterovesicular junction.  This is associated with only mild fullness of the left ureter and collecting system, however, there is extensive left perinephric stranding, which is presumably related to recent obstruction (which may be relatively relieved at this time).  No additional calculi are noted within the collecting system of either kidney, along the course of either ureter, or within the lumen of the urinary bladder.  No focal lesions are identified within either kidney on today's noncontrast CT examination.  The unenhanced appearance of the liver, gallbladder, pancreas, spleen and bilateral adrenal glands is unremarkable.  There is mild atherosclerosis throughout the abdominal and pelvic vasculature, without definite aneurysm.  Retroaortic left renal vein (normal anatomical variant) incidentally noted.  Numerous colonic diverticula are noted, most severe in the region of the descending and sigmoid colon, without surrounding inflammatory changes to suggest acute diverticulitis at this time.  A small left inguinal hernia containing only fat incidentally noted.  Prostate gland and urinary bladder are unremarkable in appearance.  No definite pathologic lymphadenopathy identified within the abdomen or pelvis on today's noncontrast CT examination.  Musculoskeletal: Postoperative changes of PLIF at L5-S1.  6 mm of anterolisthesis of L4 upon L5.  Alignment is otherwise anatomic. There are no aggressive appearing lytic or blastic lesions noted in the visualized portions of the skeleton.  IMPRESSION: 1.  3 mm partially obstructing calculous in the distal third of the left ureter shortly before the left ureterovesicular junction. Given the extensive left perinephric stranding, obstruction from this calculus was likely more severe prior  to performance of today's CT examination.  No additional calculi are noted.  2.  Colonic diverticulosis without findings to suggest acute diverticulitis at this time. 3.  Small hiatal hernia. 4.  Small pulmonary nodules in the lung bases bilaterally, largest of which measures only 4 mm in the right middle lobe.  If the patient is at high risk for bronchogenic carcinoma, follow-up chest CT at 1 year is recommended.  If the patient is at low risk, no follow-up is needed.  This recommendation follows the consensus statement: Guidelines for Management of Small Pulmonary Nodules Detected on CT Scans:  A Statement from the Fleischner Society as published in Radiology 2005; 237:395-400. 5.  Additional incidental findings, as above.   Original Report Authenticated By: Trudie Reed, M.D.   1. Nephrolithiasis   2. Nausea & vomiting     MDM  Nausea, vomiting and epigastric pain most likely due to narcotic intolerance and not taking his antiemetic prior to his current cardiac medications today. Physical exam reveals a benign abdomen. VSS. Plan is to hydrate the patient and provide IV antiemetics and pain medication for symptom control with plan to discharge when symptoms are controlled. Patient will be counseled on taking his antiemetic 15-20 minutes prior to taking his pain medication. Phenergan will be switched to Zofran. Discussed plan with Dr. Norlene Campbell who agrees with my plan. Patient signed out to Ear[facility] Favor discharge once pain is controlled.   Jeannetta Ellis, PA-C 12/07/12 [phone]

## 2012-12-06 NOTE — ED Notes (Signed)
PT. REPORTS PERSISTENT LEFT FLANK PAIN WITH EMESIS , SEEN HERE THIS MORNING DIAGNOSED WITH KIDNEY STONE - PRESCRIBED WITH ORAL PHENERGAN AND PERCOCET TABS BUT CAN NOT TAKE IT DUE TO . VOMITTING .

## 2012-12-06 NOTE — ED Notes (Signed)
Pt reports sudden sharp pain to left flank area that radiated around to left lower abdomen area onset today about 0900. Pt reports nausea. Pain has eased since using the bathroom. Pt took 600mg  of advil PTA.

## 2012-12-06 NOTE — ED Provider Notes (Signed)
CSN: [id number]     Arrival date & time 12/06/12  1017 History     First MD Initiated Contact with Patient 12/06/12 1118     Chief Complaint  Patient presents with  . Flank Pain  . Abdominal Pain   (Consider location/radiation/quality/duration/timing/severity/associated sxs/prior Treatment) HPI.... abrupt onset of severe sharp left flank pain  approximately one hour prior to admission.   Patient took ibuprofen which seemed to help. Similar episode approximately one month ago. No prior history of kidney stone. No hematuria, dysuria, fever, chills. Severity was moderate  Past Medical History  Diagnosis Date  . HTN (hypertension)     ETT negative 10/08  . Palpitations   . First degree AV block   . IBS (irritable bowel syndrome)   . Anxiety   . GERD (gastroesophageal reflux disease)   . ED (erectile dysfunction)   . BPH (benign prostatic hyperplasia)   . Diverticulosis   . Adenomatous polyps   . Hiatal hernia   . Internal hemorrhoids    Past Surgical History  Procedure Laterality Date  . Right foot surgery    . Bilateral inguinal hernia repair    . Nasl septoplasty    . Lower back surgery with lumbar vertebral repair    . Colonoscopy w/ biopsies and polypectomy  09/15/2008    adonomatous polyps, diverticulosis, internal hemorrhoids  . Upper gastrointestinal endoscopy  02/07/2010    w/dil, tourtuous esophagus, hiatal hernia   Family History  Problem Relation Age of Onset  . Lung cancer Father   . Heart disease Father   . Hypertension Father   . Arthritis Father   . Asthma Maternal Grandfather    History  Substance Use Topics  . Smoking status: Never Smoker   . Smokeless tobacco: Never Used  . Alcohol Use: No    Review of Systems  All other systems reviewed and are negative.    Allergies  Bee venom and Hydromorphone hcl  Home Medications   Current Outpatient Rx  Name  Route  Sig  Dispense  Refill  . aspirin 81 MG tablet   Oral   Take 81 mg by mouth daily.            . clonazePAM (KLONOPIN) 0.5 MG tablet   Oral   Take 0.25 mg by mouth at bedtime as needed for anxiety.         . CO ENZYME Q-10 PO   Oral   Take 1 capsule by mouth daily.         [name] EPINEPHrine (EPI-PEN) 0.3 mg/0.3 mL SOAJ   Intramuscular   Inject 0.3 mg into the muscle once.         . fish oil-omega-3 fatty acids 1000 MG capsule   Oral   Take 2 g by mouth daily.           [name] ibuprofen (ADVIL,MOTRIN) 200 MG tablet   Oral   Take 200 mg by mouth every 6 (six) hours as needed for pain.          [name] losartan (COZAAR) 100 MG tablet   Oral   Take 1 tablet (100 mg total) by mouth daily.   90 tablet   3   . Multiple Vitamins-Minerals (CENTRUM SILVER ADULT 50+ PO)   Oral   Take 1 tablet by mouth daily.         [name] omeprazole-sodium bicarbonate (ZEGERID) 40-1100 MG per capsule   Oral   Take 1 capsule by mouth daily before breakfast.  30 capsule   11     90 day fill also ok, 3 refills   . tadalafil (CIALIS) 10 MG tablet   Oral   Take 5 mg by mouth daily as needed for erectile dysfunction.          . Tamsulosin HCl (FLOMAX) 0.4 MG CAPS   Oral   Take 0.4 mg by mouth daily.           . vitamin E 200 UNIT capsule   Oral   Take 400 Units by mouth daily.          . Wheat Dextrin (BENEFIBER PO)   Oral   Take 2 each by mouth daily.          [name] oxyCODONE-acetaminophen (PERCOCET) 5-325 MG per tablet   Oral   Take 2 tablets by mouth every 4 (four) hours as needed for pain.   20 tablet   0   . promethazine (PHENERGAN) 25 MG tablet   Oral   Take 1 tablet (25 mg total) by mouth every 6 (six) hours as needed for nausea.   15 tablet   0    BP 149/86  Pulse 63  Temp(Src) 98.3 F (36.8 C) (Oral)  Resp 18  Ht 5\' 11"  (1.803 m)  Wt 230 lb (104.327 kg)  BMI 32.09 kg/m2  SpO2 96% Physical Exam  Nursing note and vitals reviewed. Constitutional: He is oriented to person, place, and time. He appears well-developed and well-nourished.  HENT:  Head:  Normocephalic and atraumatic.  Eyes: Conjunctivae and EOM are normal. Pupils are equal, round, and reactive to light.  Neck: Normal range of motion. Neck supple.  Cardiovascular: Normal rate, regular rhythm and normal heart sounds.   Pulmonary/Chest: Effort normal and breath sounds normal.  Abdominal: Soft. Bowel sounds are normal.  Genitourinary:  No flank tenderness  Musculoskeletal: Normal range of motion.  Neurological: He is alert and oriented to person, place, and time.  Skin: Skin is warm and dry.  Psychiatric: He has a normal mood and affect.    ED Course   Procedures (including critical care time)  Labs Reviewed  URINALYSIS, ROUTINE W REFLEX MICROSCOPIC - Abnormal; Notable for the following:    Glucose, UA 250 (*)    Hgb urine dipstick MODERATE (*)    Leukocytes, UA TRACE (*)    All other components within normal limits  GLUCOSE, CAPILLARY - Abnormal; Notable for the following:    Glucose-Capillary 100 (*)    All other components within normal limits  URINE MICROSCOPIC-ADD ON   Ct Abdomen Pelvis Wo Contrast  12/06/2012   *RADIOLOGY REPORT*  Clinical Data: Left-sided flank pain.  Evaluate for potential nephrolithiasis.  CT ABDOMEN AND PELVIS WITHOUT CONTRAST  Technique:  Multidetector CT imaging of the abdomen and pelvis was performed following the standard protocol without intravenous contrast.  Comparison: No priors.  Findings:  Lung Bases: 4 mm right middle lobe nodule (image two of series three).  A 3 mm left lower lobe nodule (image 11 of series 3). Small hiatal hernia.  Abdomen/Pelvis:  Image 77 of series 2 demonstrates a 3 mm calculus in the distal third of the left ureter shortly before the left ureterovesicular junction.  This is associated with only mild fullness of the left ureter and collecting system, however, there is extensive left perinephric stranding, which is presumably related to recent obstruction (which may be relatively relieved at this time).  No  additional calculi are noted within the collecting system  of either kidney, along the course of either ureter, or within the lumen of the urinary bladder.  No focal lesions are identified within either kidney on today's noncontrast CT examination.  The unenhanced appearance of the liver, gallbladder, pancreas, spleen and bilateral adrenal glands is unremarkable.  There is mild atherosclerosis throughout the abdominal and pelvic vasculature, without definite aneurysm.  Retroaortic left renal vein (normal anatomical variant) incidentally noted.  Numerous colonic diverticula are noted, most severe in the region of the descending and sigmoid colon, without surrounding inflammatory changes to suggest acute diverticulitis at this time.  A small left inguinal hernia containing only fat incidentally noted.  Prostate gland and urinary bladder are unremarkable in appearance.  No definite pathologic lymphadenopathy identified within the abdomen or pelvis on today's noncontrast CT examination.  Musculoskeletal: Postoperative changes of PLIF at L5-S1.  6 mm of anterolisthesis of L4 upon L5.  Alignment is otherwise anatomic. There are no aggressive appearing lytic or blastic lesions noted in the visualized portions of the skeleton.  IMPRESSION: 1.  3 mm partially obstructing calculous in the distal third of the left ureter shortly before the left ureterovesicular junction. Given the extensive left perinephric stranding, obstruction from this calculus was likely more severe prior to performance of today's CT examination.  No additional calculi are noted.  2.  Colonic diverticulosis without findings to suggest acute diverticulitis at this time. 3.  Small hiatal hernia. 4.  Small pulmonary nodules in the lung bases bilaterally, largest of which measures only 4 mm in the right middle lobe.  If the patient is at high risk for bronchogenic carcinoma, follow-up chest CT at 1 year is recommended.  If the patient is at low risk, no  follow-up is needed.  This recommendation follows the consensus statement: Guidelines for Management of Small Pulmonary Nodules Detected on CT Scans:  A Statement from the Fleischner Society as published in Radiology 2005; 237:395-400. 5.  Additional incidental findings, as above.   Original Report Authenticated By: Trudie Reed, M.D.   1. Right kidney stone   2. Lung nodules     MDM  Patient is pain-free in the emergency department. CT scan reveals a 3 mm calculus in the distal third left ureter. Also noted were small pulmonary nodules bilaterally. These findings were discussed with the patient and his wife.  He has urology followup. He is low risk for lung cancer. He understands to followup for his lung nodules.  Prescriptions for Percocet and Phenergan 25 mg  Donnetta Hutching, MD 12/06/12 1425

## 2012-12-06 NOTE — ED Notes (Addendum)
2237  Introduced self to the pt   2245  Pt ambulatory to the restroom  0000  Pt meds given and IV started  0040  Pt resting at this time meds working to reduce pain  0140  Pt ambulatory to the restroom.

## 2012-12-06 NOTE — ED Notes (Signed)
Notified RN of CBG 100

## 2012-12-07 ENCOUNTER — Observation Stay (HOSPITAL_COMMUNITY)
Admission: EM | Admit: 2012-12-07 | Discharge: 2012-12-08 | Disposition: A | Discharge status: Home / Self Care | Payer: Medicare Other | Attending: Urology | Admitting: Urology

## 2012-12-07 ENCOUNTER — Encounter (HOSPITAL_COMMUNITY): Payer: Self-pay | Admitting: *Deleted

## 2012-12-07 DIAGNOSIS — R112 Nausea with vomiting, unspecified: Secondary | ICD-10-CM | POA: Diagnosis not present

## 2012-12-07 DIAGNOSIS — N201 Calculus of ureter: Secondary | ICD-10-CM | POA: Diagnosis not present

## 2012-12-07 LAB — CBC WITH DIFFERENTIAL/PLATELET
Basophils Absolute: 0 10*3/uL (ref 0.0–0.1)
Basophils Relative: 0 % (ref 0–1)
Eosinophils Absolute: 0 10*3/uL (ref 0.0–0.7)
Eosinophils Relative: 0 % (ref 0–5)
HCT: 38.2 % — ABNORMAL LOW (ref 39.0–52.0)
Hemoglobin: 13.3 g/dL (ref 13.0–17.0)
Lymphocytes Relative: 7 % — ABNORMAL LOW (ref 12–46)
Lymphs Abs: 0.9 10*3/uL (ref 0.7–4.0)
MCH: 30.9 pg (ref 26.0–34.0)
MCHC: 34.8 g/dL (ref 30.0–36.0)
MCV: 88.8 fL (ref 78.0–100.0)
Monocytes Absolute: 0.8 10*3/uL (ref 0.1–1.0)
Monocytes Relative: 6 % (ref 3–12)
Neutro Abs: 11.1 10*3/uL — ABNORMAL HIGH (ref 1.7–7.7)
Neutrophils Relative %: 87 % — ABNORMAL HIGH (ref 43–77)
Platelets: 180 10*3/uL (ref 150–400)
RBC: 4.3 MIL/uL (ref 4.22–5.81)
RDW: 12.6 % (ref 11.5–15.5)
WBC: 12.8 10*3/uL — ABNORMAL HIGH (ref 4.0–10.5)

## 2012-12-07 LAB — BASIC METABOLIC PANEL
BUN: 19 mg/dL (ref 6–23)
CO2: 25 mEq/L (ref 19–32)
Calcium: 9 mg/dL (ref 8.4–10.5)
Chloride: 94 mEq/L — ABNORMAL LOW (ref 96–112)
Creatinine, Ser: 1.47 mg/dL — ABNORMAL HIGH (ref 0.50–1.35)
GFR calc Af Amer: 56 mL/min — ABNORMAL LOW (ref >90)
GFR calc non Af Amer: 48 mL/min — ABNORMAL LOW (ref >90)
Glucose, Bld: 176 mg/dL — ABNORMAL HIGH (ref 70–99)
Potassium: 3.8 mEq/L (ref 3.5–5.1)
Sodium: 130 mEq/L — ABNORMAL LOW (ref 135–145)

## 2012-12-07 MED ORDER — KCL IN DEXTROSE-NACL 20-5-0.45 MEQ/L-%-% IV SOLN
INTRAVENOUS | Status: DC
  Administered 2012-12-07 – 2012-12-08 (×2): via INTRAVENOUS
  Filled 2012-12-07 (×4): qty 1000

## 2012-12-07 MED ORDER — DOCUSATE SODIUM 100 MG PO CAPS
100.0000 mg | ORAL_CAPSULE | Freq: Two times a day (BID) | ORAL | Status: DC
  Administered 2012-12-07 – 2012-12-08 (×2): 100 mg via ORAL
  Filled 2012-12-07 (×4): qty 1

## 2012-12-07 MED ORDER — MORPHINE SULFATE 4 MG/ML IJ SOLN
4.0000 mg | Freq: Once | INTRAMUSCULAR | Status: AC
  Administered 2012-12-07: 4 mg via INTRAVENOUS
  Filled 2012-12-07: qty 1

## 2012-12-07 MED ORDER — ASPIRIN 81 MG PO CHEW
81.0000 mg | CHEWABLE_TABLET | Freq: Every day | ORAL | Status: DC
  Administered 2012-12-08: 81 mg via ORAL
  Filled 2012-12-07: qty 1

## 2012-12-07 MED ORDER — FENTANYL CITRATE 0.05 MG/ML IJ SOLN
25.0000 ug | INTRAMUSCULAR | Status: DC | PRN
  Administered 2012-12-08: 50 ug via INTRAVENOUS
  Filled 2012-12-07: qty 2

## 2012-12-07 MED ORDER — HEPARIN SODIUM (PORCINE) 5000 UNIT/ML IJ SOLN
5000.0000 [IU] | Freq: Three times a day (TID) | INTRAMUSCULAR | Status: DC
  Administered 2012-12-07 – 2012-12-08 (×2): 5000 [IU] via SUBCUTANEOUS
  Filled 2012-12-07 (×7): qty 1

## 2012-12-07 MED ORDER — FAMOTIDINE 20 MG PO TABS
20.0000 mg | ORAL_TABLET | Freq: Every day | ORAL | Status: DC
  Administered 2012-12-07 – 2012-12-08 (×2): 20 mg via ORAL
  Filled 2012-12-07 (×2): qty 1

## 2012-12-07 MED ORDER — OXYCODONE-ACETAMINOPHEN 5-325 MG PO TABS: 1.0000 | ORAL_TABLET | ORAL | Status: DC | PRN

## 2012-12-07 MED ORDER — CLONAZEPAM 0.5 MG PO TABS: 0.2500 mg | ORAL_TABLET | Freq: Three times a day (TID) | ORAL | Status: DC | PRN

## 2012-12-07 MED ORDER — FENTANYL CITRATE 0.05 MG/ML IJ SOLN
50.0000 ug | Freq: Once | INTRAMUSCULAR | Status: AC
Start: 2012-12-07 — End: 2012-12-07
  Administered 2012-12-07: 50 ug via INTRAVENOUS
  Filled 2012-12-07: qty 2

## 2012-12-07 MED ORDER — LOSARTAN POTASSIUM 50 MG PO TABS
100.0000 mg | ORAL_TABLET | Freq: Every day | ORAL | Status: DC
  Administered 2012-12-08: 100 mg via ORAL
  Filled 2012-12-07: qty 2

## 2012-12-07 MED ORDER — SODIUM CHLORIDE 0.9 % IV BOLUS (SEPSIS)
1000.0000 mL | Freq: Once | INTRAVENOUS | Status: AC
  Administered 2012-12-07: 1000 mL via INTRAVENOUS

## 2012-12-07 MED ORDER — KETOROLAC TROMETHAMINE 30 MG/ML IJ SOLN
30.0000 mg | Freq: Once | INTRAMUSCULAR | Status: AC
  Administered 2012-12-07: 30 mg via INTRAVENOUS
  Filled 2012-12-07: qty 1

## 2012-12-07 MED ORDER — LOSARTAN POTASSIUM 50 MG PO TABS: 100.0000 mg | ORAL_TABLET | Freq: Every day | ORAL | Status: DC

## 2012-12-07 MED ORDER — ASPIRIN 81 MG PO TABS: 81.0000 mg | ORAL_TABLET | Freq: Every day | ORAL | Status: DC

## 2012-12-07 MED ORDER — ONDANSETRON HCL 4 MG/2ML IJ SOLN
4.0000 mg | Freq: Once | INTRAMUSCULAR | Status: AC
  Administered 2012-12-07: 4 mg via INTRAVENOUS
  Filled 2012-12-07: qty 2

## 2012-12-07 MED ORDER — TAMSULOSIN HCL 0.4 MG PO CAPS
0.4000 mg | ORAL_CAPSULE | Freq: Every day | ORAL | Status: DC
  Administered 2012-12-07 – 2012-12-08 (×2): 0.4 mg via ORAL
  Filled 2012-12-07 (×2): qty 1

## 2012-12-07 MED ORDER — ONDANSETRON 4 MG PO TBDP: 4.0000 mg | ORAL_TABLET | Freq: Three times a day (TID) | ORAL | Status: DC | PRN

## 2012-12-07 MED ORDER — ONDANSETRON HCL 4 MG PO TABS: 4.0000 mg | ORAL_TABLET | Freq: Four times a day (QID) | ORAL | Status: DC

## 2012-12-07 MED ORDER — SENNA 8.6 MG PO TABS
1.0000 | ORAL_TABLET | Freq: Two times a day (BID) | ORAL | Status: DC
  Administered 2012-12-07: 8.6 mg via ORAL
  Filled 2012-12-07: qty 1

## 2012-12-07 MED ORDER — ONDANSETRON HCL 4 MG/2ML IJ SOLN
4.0000 mg | INTRAMUSCULAR | Status: DC | PRN
Start: 2012-12-07 — End: 2012-12-08

## 2012-12-07 NOTE — ED Provider Notes (Signed)
Medical screening examination/treatment/procedure(s) were performed by non-physician practitioner and as supervising physician I was immediately available for consultation/collaboration.  Olivia Mackie, MD 12/07/12 [phone]

## 2012-12-07 NOTE — Progress Notes (Addendum)
Patient voided since coming from the ED. Urine was strained and there were 2 "ground pepper" sized stones in the yellow, straw colored urine. Will continue to monitor the patient.

## 2012-12-07 NOTE — ED Notes (Addendum)
Patient with history of hiatal hernia states that he was diagnosed with a kidney stone earlier this week and has voided very little, especially compared to how much fluid he has taken in. Patient reports feeling very nauseated as though everything he eats gets "stuck" in his sternal area. Patient reports large amounts of emesis as well as stomach and flank pain.

## 2012-12-07 NOTE — ED Provider Notes (Signed)
Patient feeling better understands how to take medications at this time   Arman Filter, NP 12/07/12 [phone]

## 2012-12-07 NOTE — ED Provider Notes (Signed)
5:42 PM Patient is a 67 year old male who has a known left side uretal stone diagnosed last night. Patient states he has not been able to keep PO medication down due to his nausea and vomiting. Patient spoke with Dr. Berneice Heinrich of Urology who instructed him to come to the ED for re-evaluation. Patient denies any changes in symptoms. Patient will have IV medications. Dr. Berneice Heinrich is on the way, per ED Secretary. Vitals stable and patient afebrile.   Emilia Beck, [place] 12/07/12 1808

## 2012-12-07 NOTE — H&P (Addendum)
Alex Castillo is an 67 y.o. male.    Chief Complaint: Left Ureteral Stone  HPI:   1 - Left Ureteral Stone with Refractory Nausea / Emesis - Pt with 3mm left mid ureteral stone by CT in [place] 7/25. UA without pyuria, no fevers. No significant additional stone. Given trial of medical therapy with pain and nausea meds but pain and nausea now refractory and re-prestents to ER.   No MI/CAD. No strong blood thinners. Has has hernia repair and back surgery.   Past Medical History  Diagnosis Date  . HTN (hypertension)     ETT negative 10/08  . Palpitations   . First degree AV block   . IBS (irritable bowel syndrome)   . Anxiety   . GERD (gastroesophageal reflux disease)   . ED (erectile dysfunction)   . BPH (benign prostatic hyperplasia)   . Diverticulosis   . Adenomatous polyps   . Hiatal hernia   . Internal hemorrhoids     Past Surgical History  Procedure Laterality Date  . Right foot surgery    . Bilateral inguinal hernia repair    . Nasl septoplasty    . Lower back surgery with lumbar vertebral repair    . Colonoscopy w/ biopsies and polypectomy  09/15/2008    adonomatous polyps, diverticulosis, internal hemorrhoids  . Upper gastrointestinal endoscopy  02/07/2010    w/dil, tourtuous esophagus, hiatal hernia    Family History  Problem Relation Age of Onset  . Lung cancer Father   . Heart disease Father   . Hypertension Father   . Arthritis Father   . Asthma Maternal Grandfather    Social History:  reports that he has never smoked. He has never used smokeless tobacco. He reports that he does not drink alcohol or use illicit drugs.  Allergies:  Allergies  Allergen Reactions  . Bee Venom Anaphylaxis  . Hydromorphone Hcl Other (See Comments)    Reaction unknown     (Not in a hospital admission)  Results for orders placed during the hospital encounter of 12/06/12 (from the past 48 hour(s))  URINALYSIS, ROUTINE W REFLEX MICROSCOPIC     Status: Abnormal    Collection Time    12/06/12 12:34 PM      Result Value Range   Color, Urine YELLOW  YELLOW   APPearance CLEAR  CLEAR   Specific Gravity, Urine 1.019  1.005 - 1.030   pH 5.5  5.0 - 8.0   Glucose, UA 250 (*) NEGATIVE mg/dL   Hgb urine dipstick MODERATE (*) NEGATIVE   Bilirubin Urine NEGATIVE  NEGATIVE   Ketones, ur NEGATIVE  NEGATIVE mg/dL   Protein, ur NEGATIVE  NEGATIVE mg/dL   Urobilinogen, UA 0.2  0.0 - 1.0 mg/dL   Nitrite NEGATIVE  NEGATIVE   Leukocytes, UA TRACE (*) NEGATIVE  URINE MICROSCOPIC-ADD ON     Status: None   Collection Time    12/06/12 12:34 PM      Result Value Range   WBC, UA 3-6  <3 WBC/hpf   RBC / HPF 3-6  <3 RBC/hpf   Urine-Other MUCOUS PRESENT    GLUCOSE, CAPILLARY     Status: Abnormal   Collection Time    12/06/12  1:38 PM      Result Value Range   Glucose-Capillary 100 (*) 70 - 99 mg/dL   Ct Abdomen Pelvis Wo Contrast  12/06/2012   *RADIOLOGY REPORT*  Clinical Data: Left-sided flank pain.  Evaluate for potential nephrolithiasis.  CT ABDOMEN AND  PELVIS WITHOUT CONTRAST  Technique:  Multidetector CT imaging of the abdomen and pelvis was performed following the standard protocol without intravenous contrast.  Comparison: No priors.  Findings:  Lung Bases: 4 mm right middle lobe nodule (image two of series three).  A 3 mm left lower lobe nodule (image 11 of series 3). Small hiatal hernia.  Abdomen/Pelvis:  Image 77 of series 2 demonstrates a 3 mm calculus in the distal third of the left ureter shortly before the left ureterovesicular junction.  This is associated with only mild fullness of the left ureter and collecting system, however, there is extensive left perinephric stranding, which is presumably related to recent obstruction (which may be relatively relieved at this time).  No additional calculi are noted within the collecting system of either kidney, along the course of either ureter, or within the lumen of the urinary bladder.  No focal lesions are identified  within either kidney on today's noncontrast CT examination.  The unenhanced appearance of the liver, gallbladder, pancreas, spleen and bilateral adrenal glands is unremarkable.  There is mild atherosclerosis throughout the abdominal and pelvic vasculature, without definite aneurysm.  Retroaortic left renal vein (normal anatomical variant) incidentally noted.  Numerous colonic diverticula are noted, most severe in the region of the descending and sigmoid colon, without surrounding inflammatory changes to suggest acute diverticulitis at this time.  A small left inguinal hernia containing only fat incidentally noted.  Prostate gland and urinary bladder are unremarkable in appearance.  No definite pathologic lymphadenopathy identified within the abdomen or pelvis on today's noncontrast CT examination.  Musculoskeletal: Postoperative changes of PLIF at L5-S1.  6 mm of anterolisthesis of L4 upon L5.  Alignment is otherwise anatomic. There are no aggressive appearing lytic or blastic lesions noted in the visualized portions of the skeleton.  IMPRESSION: 1.  3 mm partially obstructing calculous in the distal third of the left ureter shortly before the left ureterovesicular junction. Given the extensive left perinephric stranding, obstruction from this calculus was likely more severe prior to performance of today's CT examination.  No additional calculi are noted.  2.  Colonic diverticulosis without findings to suggest acute diverticulitis at this time. 3.  Small hiatal hernia. 4.  Small pulmonary nodules in the lung bases bilaterally, largest of which measures only 4 mm in the right middle lobe.  If the patient is at high risk for bronchogenic carcinoma, follow-up chest CT at 1 year is recommended.  If the patient is at low risk, no follow-up is needed.  This recommendation follows the consensus statement: Guidelines for Management of Small Pulmonary Nodules Detected on CT Scans:  A Statement from the Fleischner Society as  published in Radiology 2005; 237:395-400. 5.  Additional incidental findings, as above.   Original Report Authenticated By: Trudie Reed, M.D.    Review of Systems  Constitutional: Negative.  Negative for fever and chills.  HENT: Negative.   Eyes: Negative.   Respiratory: Negative.   Cardiovascular: Negative.   Gastrointestinal: Positive for nausea and vomiting.  Genitourinary: Positive for flank pain.  Musculoskeletal: Negative.   Skin: Negative.   Neurological: Negative.   Endo/Heme/Allergies: Negative.   Psychiatric/Behavioral: Negative.     Blood pressure 135/76, pulse 62, temperature 97.7 F (36.5 C), temperature source Oral, resp. rate 18, SpO2 98.00%. Physical Exam  Constitutional: He is oriented to person, place, and time. He appears well-developed and well-nourished.  HENT:  Head: Normocephalic and atraumatic.  Eyes: EOM are normal. Pupils are equal, round, and reactive to  light.  Neck: Normal range of motion. Neck supple.  Cardiovascular: Normal rate.   Respiratory: Effort normal.  GI: Soft.  Genitourinary: Penis normal.  Moderate left CVAT  Musculoskeletal: Normal range of motion.  Neurological: He is alert and oriented to person, place, and time.  Skin: Skin is warm and dry.  Psychiatric: He has a normal mood and affect. His behavior is normal. Judgment and thought content normal.     Assessment/Plan  1 - Left Ureteral Stone with Refractory Nausea / Emesis - Options including continued medical therapy in inpatient or outpatient setting as well as intervention with ureteroscopy or SWL discussed. Pt wants to proceed with left ureteroscopic stone manipulation in AM. Will admit and receive medical therapy in interval + strain urine.   Risks including bleeding, infection, non-cure, need for staged approach as well as DVT,PT,MI,CVA,Mortality discussed. NPO p 10PM for 630AM start case tomorrow.  Sylvio Weatherall 12/07/2012, 6:28 PM

## 2012-12-07 NOTE — ED Provider Notes (Signed)
Medical screening examination/treatment/procedure(s) were performed by non-physician practitioner and as supervising physician I was immediately available for consultation/collaboration.  Kaige Whistler M Jenai Scaletta, MD 12/07/12 0713 

## 2012-12-08 ENCOUNTER — Encounter (HOSPITAL_COMMUNITY): Payer: Self-pay | Admitting: *Deleted

## 2012-12-08 ENCOUNTER — Observation Stay (HOSPITAL_COMMUNITY): Payer: Medicare Other | Admitting: Anesthesiology

## 2012-12-08 ENCOUNTER — Encounter (HOSPITAL_COMMUNITY): Payer: Self-pay | Admitting: Anesthesiology

## 2012-12-08 ENCOUNTER — Encounter (HOSPITAL_COMMUNITY)
Admission: EM | Disposition: A | Discharge status: Home / Self Care | Payer: Self-pay | Source: Home / Self Care | Attending: Emergency Medicine

## 2012-12-08 DIAGNOSIS — N400 Enlarged prostate without lower urinary tract symptoms: Secondary | ICD-10-CM | POA: Diagnosis not present

## 2012-12-08 DIAGNOSIS — R110 Nausea: Secondary | ICD-10-CM | POA: Diagnosis not present

## 2012-12-08 DIAGNOSIS — N1330 Unspecified hydronephrosis: Secondary | ICD-10-CM | POA: Diagnosis not present

## 2012-12-08 DIAGNOSIS — N201 Calculus of ureter: Secondary | ICD-10-CM | POA: Diagnosis not present

## 2012-12-08 DIAGNOSIS — I10 Essential (primary) hypertension: Secondary | ICD-10-CM | POA: Diagnosis not present

## 2012-12-08 DIAGNOSIS — K589 Irritable bowel syndrome without diarrhea: Secondary | ICD-10-CM | POA: Diagnosis not present

## 2012-12-08 DIAGNOSIS — R112 Nausea with vomiting, unspecified: Secondary | ICD-10-CM | POA: Diagnosis not present

## 2012-12-08 HISTORY — PX: CYSTOSCOPY/RETROGRADE/URETEROSCOPY/STONE EXTRACTION WITH BASKET: SHX5317

## 2012-12-08 LAB — SURGICAL PCR SCREEN
MRSA, PCR: NEGATIVE
Staphylococcus aureus: NEGATIVE

## 2012-12-08 SURGERY — CYSTOSCOPY, WITH CALCULUS REMOVAL USING BASKET
Anesthesia: General | Laterality: Left | Wound class: Clean Contaminated

## 2012-12-08 MED ORDER — PROMETHAZINE HCL 25 MG/ML IJ SOLN: 6.2500 mg | INTRAMUSCULAR | Status: DC | PRN

## 2012-12-08 MED ORDER — SENNOSIDES-DOCUSATE SODIUM 8.6-50 MG PO TABS: 1.0000 | ORAL_TABLET | Freq: Two times a day (BID) | ORAL | Status: DC

## 2012-12-08 MED ORDER — IOHEXOL 300 MG/ML  SOLN
INTRAMUSCULAR | Status: DC | PRN
  Administered 2012-12-08: 10 mL

## 2012-12-08 MED ORDER — LACTATED RINGERS IV SOLN
INTRAVENOUS | Status: DC | PRN
  Administered 2012-12-08 (×2): via INTRAVENOUS

## 2012-12-08 MED ORDER — GENTAMICIN SULFATE 40 MG/ML IJ SOLN
540.0000 mg | Freq: Once | INTRAVENOUS | Status: AC
  Administered 2012-12-08: 540 mg via INTRAVENOUS
  Filled 2012-12-08: qty 13.5

## 2012-12-08 MED ORDER — PROPOFOL 10 MG/ML IV BOLUS
INTRAVENOUS | Status: DC | PRN
  Administered 2012-12-08: 200 mg via INTRAVENOUS

## 2012-12-08 MED ORDER — IOHEXOL 300 MG/ML  SOLN
INTRAMUSCULAR | Status: AC
  Filled 2012-12-08: qty 1

## 2012-12-08 MED ORDER — FENTANYL CITRATE 0.05 MG/ML IJ SOLN: 25.0000 ug | INTRAMUSCULAR | Status: DC | PRN

## 2012-12-08 MED ORDER — MIDAZOLAM HCL 5 MG/5ML IJ SOLN
INTRAMUSCULAR | Status: DC | PRN
  Administered 2012-12-08: 2 mg via INTRAVENOUS

## 2012-12-08 MED ORDER — GENTAMICIN SULFATE 40 MG/ML IJ SOLN
540.0000 mg | Freq: Once | INTRAVENOUS | Status: DC
  Filled 2012-12-08: qty 13.5

## 2012-12-08 MED ORDER — SODIUM CHLORIDE 0.9 % IR SOLN
Status: DC | PRN
  Administered 2012-12-08: 4000 mL

## 2012-12-08 MED ORDER — SULFAMETHOXAZOLE-TMP DS 800-160 MG PO TABS: 1.0000 | ORAL_TABLET | Freq: Every day | ORAL | Status: DC

## 2012-12-08 MED ORDER — LIDOCAINE HCL (CARDIAC) 10 MG/ML IV SOLN
INTRAVENOUS | Status: DC | PRN
  Administered 2012-12-08: 50 mg via INTRAVENOUS

## 2012-12-08 MED ORDER — FENTANYL CITRATE 0.05 MG/ML IJ SOLN
INTRAMUSCULAR | Status: DC | PRN
  Administered 2012-12-08: 100 ug via INTRAVENOUS

## 2012-12-08 MED ORDER — OXYBUTYNIN CHLORIDE 5 MG PO TABS: 5.0000 mg | ORAL_TABLET | Freq: Three times a day (TID) | ORAL | Status: DC | PRN

## 2012-12-08 SURGICAL SUPPLY — 17 items
BAG URO CATCHER STRL LF (DRAPE) ×2 IMPLANT
BASKET LASER NITINOL 1.9FR (BASKET) ×1 IMPLANT
BSKT STON RTRVL 120 1.9FR (BASKET) ×1
CATH INTERMIT  6FR 70CM (CATHETERS) ×2 IMPLANT
CLOTH BEACON ORANGE TIMEOUT ST (SAFETY) ×2 IMPLANT
DRAPE CAMERA CLOSED 9X96 (DRAPES) ×2 IMPLANT
GLOVE BIOGEL M STRL SZ7.5 (GLOVE) ×2 IMPLANT
GOWN PREVENTION PLUS XLARGE (GOWN DISPOSABLE) ×2 IMPLANT
GOWN STRL NON-REIN LRG LVL3 (GOWN DISPOSABLE) ×2 IMPLANT
GUIDEWIRE ANG ZIPWIRE 038X150 (WIRE) ×2 IMPLANT
GUIDEWIRE STR DUAL SENSOR (WIRE) ×2 IMPLANT
MANIFOLD NEPTUNE II (INSTRUMENTS) ×2 IMPLANT
PACK CYSTO (CUSTOM PROCEDURE TRAY) ×2 IMPLANT
STENT CONTOUR 6FRX26X.038 (STENTS) ×1 IMPLANT
SYR CONTROL 10ML LL (SYRINGE) ×1 IMPLANT
TUBE FEEDING 8FR 16IN STR KANG (MISCELLANEOUS) ×2 IMPLANT
TUBING CONNECTING 10 (TUBING) ×2 IMPLANT

## 2012-12-08 NOTE — Anesthesia Preprocedure Evaluation (Addendum)
Anesthesia Evaluation  Patient identified by MRN, date of birth, ID band Patient awake    Reviewed: Allergy & Precautions, H&P , NPO status , Patient's Chart, lab work & pertinent test results  Airway Mallampati: III TM Distance: >3 FB Neck ROM: Full  Mouth opening: Limited Mouth Opening  Dental no notable dental hx. (+) Dental Advisory Given and Teeth Intact   Pulmonary neg pulmonary ROS,  breath sounds clear to auscultation  Pulmonary exam normal       Cardiovascular Exercise Tolerance: Good hypertension, Pt. on medications - dysrhythmias Rhythm:Regular Rate:Normal     Neuro/Psych PSYCHIATRIC DISORDERS Anxiety negative neurological ROS     GI/Hepatic Neg liver ROS, hiatal hernia, GERD-  Medicated,  Endo/Other  negative endocrine ROS  Renal/GU negative Renal ROS     Musculoskeletal negative musculoskeletal ROS (+)   Abdominal (+) + obese,   Peds  Hematology negative hematology ROS (+)   Anesthesia Other Findings   Reproductive/Obstetrics                         Anesthesia Physical Anesthesia Plan  ASA: II  Anesthesia Plan: General   Post-op Pain Management:    Induction: Intravenous  Airway Management Planned: LMA  Additional Equipment:   Intra-op Plan:   Post-operative Plan: Extubation in OR  Informed Consent: I have reviewed the patients History and Physical, chart, labs and discussed the procedure including the risks, benefits and alternatives for the proposed anesthesia with the patient or authorized representative who has indicated his/her understanding and acceptance.   Dental advisory given  Plan Discussed with: CRNA  Anesthesia Plan Comments:         Anesthesia Quick Evaluation

## 2012-12-08 NOTE — Care Management Note (Signed)
    Page 1 of 1   12/08/2012     12:28:46 PM   CARE MANAGEMENT NOTE 12/08/2012  Patient:  Alex Castillo, Alex Castillo   Account Number:  [id number]  Date Initiated:  12/08/2012  Documentation initiated by:  Lanier Clam  Subjective/Objective Assessment:   ADMITTED W/L URETERAL STONE.     Action/Plan:   FROM HOME.   Anticipated DC Date:  12/08/2012   Anticipated DC Plan:  HOME/SELF CARE      DC Planning Services  CM consult      Choice offered to / List presented to:             Status of service:  Completed, signed off Medicare Important Message given?   (If response is "NO", the following Medicare IM given date fields will be blank) Date Medicare IM given:   Date Additional Medicare IM given:    Discharge Disposition:  HOME/SELF CARE  Per UR Regulation:  Reviewed for med. necessity/level of care/duration of stay  If discussed at Long Length of Stay Meetings, dates discussed:    Comments:  12/08/12 Tyde Lamison RN,BSN NCM [phone] S/P CYSTOSCOPY.NO D/C NEEDS OR ORDERS.

## 2012-12-08 NOTE — Progress Notes (Signed)
S: Less Nausea, has still not passed stone  O: AFVSS NAD Mild Left CVAT  A/P: 1 - Proceed with Left ureteroscopic stone manipulation as scheduled, likely DC home later today following.

## 2012-12-08 NOTE — Op Note (Signed)
NAMEDEMETRION, WESBY NO.:  [id number]  MEDICAL RECORD NO.:  [id number]  LOCATION:  1409                         FACILITY:  [facility]  PHYSICIAN:  Sebastian Ache, MD     DATE OF BIRTH:  1945-07-04  DATE OF PROCEDURE: 12/08/2012 DATE OF DISCHARGE:                              OPERATIVE REPORT   DIAGNOSIS:  Left ureteral stone, refractory pain, nausea, vomiting.  PROCEDURE: 1. Cystoscopy with left retrograde pyelogram interpretation. 2. Left ureteroscopy with basketing of stone. 3. Placement of left ureteral stent 6 x 26 with tether.  COMPLICATIONS:  None.  SPECIMENS:  Left ureteral stone for compositional analysis.  FINDINGS: 1. Left distal ureteral stone approximately 4 mm with mild     hydroureteronephrosis. 2. No additional calculi in the left side. 3. Unremarkable urinary bladder. 4. Moderate trilobar prostatic hypertrophy.  ESTIMATED BLOOD LOSS:  Nil.  INDICATION:  Mr. Doubleday is a pleasant 67 year old gentleman with recent onset of renal colic from the left distal ureteral stone.  He was seen in the [facility] ER 2 nights ago, where he was discharged, given a trial of medical expulsive therapy.  He unfortunately had refractory pain, nausea, vomiting as well as evidence of acute renal failure and bumped up in his creatinine to 1.5 range.  He re-presented to [facility] ER the following day.  Options were discussed including continued medical therapy versus definitive therapy with ureteroscopy and wished to proceed with the latter.  Informed consent was obtained and placed in the medical record.  PROCEDURE IN DETAIL:  The patient being Claiborne Stroble, procedure being left ureteroscopic stone manipulation was confirmed.  Procedure was carried out.  Time-out was performed.  Intravenous antibiotics were administered.  General LMA anesthesia was introduced.  The patient was placed into a low lithotomy position.  Sterile field was created by prepping and  draping the patient's penis, perineum, and proximal thighs using iodine x3.  Next, cystourethroscopy was performed using a 22- French rigid cystoscope with 12-degree offset lens.  Inspection of the anterior and posterior urethra revealed moderate trilobar prostatic hypertrophy.  Inspection of the urinary bladder revealed no diverticula, calcifications, papular lesions.  Ureteral orifices were in the normal anatomic position.  The left ureteral orifice was cannulated with a 6- French end-hole catheter and left retrograde pyelogram seen.  Left retrograde pyelogram demonstrated a single left ureter, single system, left kidney.  There was a filling defect in distal ureter consistent with known stone with very mild hydroureteronephrosis above this.  A 0.038 Glidewire was advanced at the level of the upper pole and set aside as a safety wire.  Next, semi-rigid ureteroscopy was performed of the distal ureter alongside a separate Sensor working wire with an 8- [place] feeding tube in the urinary bladder for pressure release.  Indeed the stone in question was seen just prior to the iliac crossing superior rather fusiform and amenable to basketing as such an escape basket was used to grasp the stone.  It was removed in its entirety, set aside for compositional analysis.  Repeat semi-rigid ureteroscopy of the distal two-thirds of the ureter revealed no additional calcifications and no mucosal abnormalities.  Next, the semi-rigid  ureteroscope was exchanged for the 6-French flexible ureteroscope over the Sensor working wire, which allowed systematic inspection of each calix, as well as the entire length of the left ureter.  No additional calcifications or mucosal abnormalities were found.  Finally, a new 6 x 26 double-J stent was placed over the remaining safety wire using cystoscopic and fluoroscopic guidance.  Good proximal and distal curl were noted.  Efflux of urine was seen around and through the  distal end of the stent.  Bladder was emptied per cystoscope.  Procedure was then terminated after the stent tether was fashioned to the dorsum of the penis.          ______________________________ Sebastian Ache, MD     TM/MEDQ  D:  12/08/2012  T:  12/08/2012  Job:  [id number]

## 2012-12-08 NOTE — Anesthesia Postprocedure Evaluation (Signed)
Anesthesia Post Note  Patient: Alex Castillo  Procedure(s) Performed: Procedure(s) (LRB): CYSTOSCOPY, LEFT RETROGRADE, LEFT URETEROSCOPY/STONE EXTRACTION WITH BASKET (Left)  Anesthesia type: General  Patient location: PACU  Post pain: Pain level controlled  Post assessment: Post-op Vital signs reviewed  Last Vitals: BP 125/62  Pulse 57  Temp(Src) 36.4 C (Oral)  Resp 14  Ht 5\' 11"  (1.803 m)  Wt 237 lb 12.8 oz (107.865 kg)  BMI 33.18 kg/m2  SpO2 100%  Post vital signs: Reviewed  Level of consciousness: sedated  Complications: No apparent anesthesia complications

## 2012-12-08 NOTE — Brief Op Note (Signed)
12/07/2012 - 12/08/2012  7:33 AM  PATIENT:  Alex Castillo  67 y.o. male  PRE-OPERATIVE DIAGNOSIS:  left ureteral calculi  POST-OPERATIVE DIAGNOSIS:  Left ureteral calculi  PROCEDURE:  Procedure(s): CYSTOSCOPY, LEFT RETROGRADE, LEFT URETEROSCOPY/STONE EXTRACTION WITH BASKET (Left)  SURGEON:  Surgeon(s) and Role:    * Sebastian Ache, MD - Primary  PHYSICIAN ASSISTANT:   ASSISTANTS: none   ANESTHESIA:   general  EBL:  Total I/O In: 1000 [I.V.:1000] Out: -   BLOOD ADMINISTERED:none  DRAINS: none   LOCAL MEDICATIONS USED:  NONE  SPECIMEN:  Source of Specimen:  Left Distal Ureteral Stone  DISPOSITION OF SPECIMEN:  [facility] for Compositional analysis  COUNTS:  YES  TOURNIQUET:  * No tourniquets in log *  DICTATION: .Other Dictation: Dictation Number [id number]  PLAN OF CARE: Discharge to home after PACU  PATIENT DISPOSITION:  PACU - hemodynamically stable.   Delay start of Pharmacological VTE agent (>24hrs) due to surgical blood loss or risk of bleeding: not applicable

## 2012-12-08 NOTE — Transfer of Care (Signed)
Immediate Anesthesia Transfer of Care Note  Patient: Alex Castillo  Procedure(s) Performed: Procedure(s): CYSTOSCOPY, LEFT RETROGRADE, LEFT URETEROSCOPY/STONE EXTRACTION WITH BASKET (Left)  Patient Location: PACU  Anesthesia Type:General  Level of Consciousness: awake, alert , oriented and patient cooperative  Airway & Oxygen Therapy: Patient Spontanous Breathing and Patient connected to face mask oxygen  Post-op Assessment: Report given to PACU RN, Post -op Vital signs reviewed and stable and Patient moving all extremities  Post vital signs: Reviewed and stable  Complications: No apparent anesthesia complications

## 2012-12-09 ENCOUNTER — Encounter (HOSPITAL_COMMUNITY): Payer: Self-pay | Admitting: Urology

## 2012-12-09 NOTE — ED Provider Notes (Signed)
Medical screening examination initiated by non-physician practitioner and as supervising physician I was immediately available for consultation/collaboration with completion of MSE by urologist who planned on meeting patient in ED.   Hurman Horn, MD 12/09/12 2042

## 2012-12-11 DIAGNOSIS — N200 Calculus of kidney: Secondary | ICD-10-CM | POA: Diagnosis not present

## 2012-12-25 DIAGNOSIS — T6391XA Toxic effect of contact with unspecified venomous animal, accidental (unintentional), initial encounter: Secondary | ICD-10-CM | POA: Diagnosis not present

## 2013-01-02 ENCOUNTER — Telehealth: Payer: Self-pay

## 2013-01-02 NOTE — Telephone Encounter (Signed)
Prior authorization started for Alex Castillo, phone # [phone] for Zegerid rx.  Dx: GERD 530.81.  Patient has tried and failed Aciphex, Zantac. Per Dallie Dad this is being sent to clinical review and we will hear within 72 hours the outcome.

## 2013-01-09 ENCOUNTER — Telehealth: Payer: Self-pay

## 2013-01-09 MED ORDER — OMEPRAZOLE-SODIUM BICARBONATE 40-1100 MG PO CAPS: 1.0000 | ORAL_CAPSULE | Freq: Every day | ORAL | Status: DC

## 2013-01-09 NOTE — Telephone Encounter (Signed)
Message copied by [place], Caven Perine E on Fri Jan 09, 2013  3:39 PM ------      Message from: Iva Boop      Created: Fri Jan 09, 2013  8:31 AM       Taking omeprazole pill and sodium bicarbonate is not the same as taking Zegerid, don't do that. - I can explain more at f/u                  We can leave some Zegerid samples up front             ----- Message -----         From: Eoin Willden E [place], CMA         Sent: 01/08/2013   5:09 PM           To: Iva Boop, MD            Patient contacted about the denial of his zegrid.  He said he can buy the omeprazole and sodium bicarbonate separate and take them.  He is also willing to take the Nexium (tried in past and got nausea after a week or so). Prilosec caused the same side reaction.  Has not tried Protonix.  Please advise, thank you Sir.            Patient also stated that he has been choking and he wonders if he needs another EGD, he has appointment for 02/04/13, 9:45am to discuss this matter with you.             He wanted you to know that he had a Kidney stone attack 3 weeks ago.                   ------

## 2013-01-09 NOTE — Telephone Encounter (Signed)
Left message on patients home voice mail regarding Dr. Marvell Fuller message below , samples put up front for pick up.

## 2013-01-23 ENCOUNTER — Encounter: Payer: Self-pay | Admitting: *Deleted

## 2013-01-26 NOTE — Discharge Summary (Signed)
Physician Discharge Summary  Patient ID: Alex Castillo MRN: [id number] DOB/AGE: Oct 09, 1945 67 y.o.  Admit date: 12/07/2012 Discharge date: 12/08/2012  Admission Diagnoses: Left Ureteral Stone  Discharge Diagnoses: Left Ureteral Stone   Discharged Condition: good  Hospital Course:   Pt admitted through ER 7/27 for small left ureteral stone with refractory renal colic. Underwent successfully ureteroscopy with basketing of stone 7/28 after which he was discharged to home.   Consults: None  Significant Diagnostic Studies: intraoperative fluoroscopy  Treatments: surgery: as per above  Discharge Exam: Blood pressure 154/78, pulse 65, temperature 98.3 F (36.8 C), temperature source Oral, resp. rate 20, height 5\' 11"  (1.803 m), weight 107.865 kg (237 lb 12.8 oz), SpO2 95.00%. General appearance: alert, cooperative and appears stated age Head: Normocephalic, without obvious abnormality, atraumatic Eyes: negative, conjunctivae/corneas clear. PERRL, EOM's intact. Fundi benign. Ears: normal TM's and external ear canals both ears Nose: Nares normal. Septum midline. Mucosa normal. No drainage or sinus tenderness. Throat: lips, mucosa, and tongue normal; teeth and gums normal Neck: no adenopathy, no carotid bruit, no JVD, supple, symmetrical, trachea midline and thyroid not enlarged, symmetric, no tenderness/mass/nodules Back: symmetric, no curvature. ROM normal. No CVA tenderness. Resp: clear to auscultation bilaterally Chest wall: no tenderness Cardio: regular rate and rhythm, S1, S2 normal, no murmur, click, rub or gallop GI: soft, non-tender; bowel sounds normal; no masses,  no organomegaly Male genitalia: normal Extremities: extremities normal, atraumatic, no cyanosis or edema Pulses: 2+ and symmetric Skin: Skin color, texture, turgor normal. No rashes or lesions Lymph nodes: Cervical, supraclavicular, and axillary nodes normal. Neurologic: Grossly normal  Disposition: 01-Home  or Self Care   Future Appointments Provider Department Dept Phone   02/04/2013 9:45 AM Iva Boop, MD [facility] [phone]       Medication List         aspirin 81 MG tablet  Take 81 mg by mouth daily.     BENEFIBER PO  Take 2 each by mouth daily.     CENTRUM SILVER ADULT 50+ PO  Take 1 tablet by mouth daily.     clonazePAM 0.5 MG tablet  Commonly known as:  KLONOPIN  Take 0.25 mg by mouth at bedtime as needed for anxiety.     CO ENZYME Q-10 PO  Take 1 capsule by mouth daily.     EPINEPHrine 0.3 mg/0.3 mL Soaj injection  Commonly known as:  EPI-PEN  Inject 0.3 mg into the muscle once.     fish oil-omega-3 fatty acids 1000 MG capsule  Take 2 g by mouth daily.     ibuprofen 200 MG tablet  Commonly known as:  ADVIL,MOTRIN  Take 200 mg by mouth every 6 (six) hours as needed for pain.     losartan 100 MG tablet  Commonly known as:  COZAAR  Take 1 tablet (100 mg total) by mouth daily.     ondansetron 4 MG disintegrating tablet  Commonly known as:  ZOFRAN ODT  Take 1 tablet (4 mg total) by mouth every 8 (eight) hours as needed for nausea.     oxybutynin 5 MG tablet  Commonly known as:  DITROPAN  Take 1 tablet (5 mg total) by mouth every 8 (eight) hours as needed. For bladder spasms / stent pain.     oxyCODONE-acetaminophen 5-325 MG per tablet  Commonly known as:  PERCOCET  Take 2 tablets by mouth every 4 (four) hours as needed for pain.     promethazine 25 MG tablet  Commonly  known as:  PHENERGAN  Take 1 tablet (25 mg total) by mouth every 6 (six) hours as needed for nausea.     ranitidine 150 MG capsule  Commonly known as:  ZANTAC  Take 300 mg by mouth 2 (two) times daily.     senna-docusate 8.6-50 MG per tablet  Commonly known as:  Senokot-S  Take 1 tablet by mouth 2 (two) times daily. While taking pain meds to prevent constipation     sulfamethoxazole-trimethoprim 800-160 MG per tablet  Commonly known as:  BACTRIM DS  Take  1 tablet by mouth daily. X 5 days to prevent infection     tadalafil 10 MG tablet  Commonly known as:  CIALIS  Take 5 mg by mouth daily as needed for erectile dysfunction.     tamsulosin 0.4 MG Caps capsule  Commonly known as:  FLOMAX  Take 0.4 mg by mouth daily.     vitamin E 200 UNIT capsule  Take 400 Units by mouth daily.           Follow-up Information   Follow up with Sebastian Ache, MD. (we will contact you for follow-up visit in about 2-3 weeks.)    Specialty:  Urology   Contact information:   [address] [place] [address] [phone]       Signed: Sebastian Ache 01/26/2013, 5:11 PM

## 2013-01-27 DIAGNOSIS — N529 Male erectile dysfunction, unspecified: Secondary | ICD-10-CM | POA: Diagnosis not present

## 2013-01-27 DIAGNOSIS — N138 Other obstructive and reflux uropathy: Secondary | ICD-10-CM | POA: Diagnosis not present

## 2013-01-27 DIAGNOSIS — N200 Calculus of kidney: Secondary | ICD-10-CM | POA: Diagnosis not present

## 2013-01-27 DIAGNOSIS — N401 Enlarged prostate with lower urinary tract symptoms: Secondary | ICD-10-CM | POA: Diagnosis not present

## 2013-02-04 ENCOUNTER — Encounter: Payer: Self-pay | Admitting: Internal Medicine

## 2013-02-04 ENCOUNTER — Ambulatory Visit (INDEPENDENT_AMBULATORY_CARE_PROVIDER_SITE_OTHER): Payer: Medicare Other | Admitting: Internal Medicine

## 2013-02-04 VITALS — BP 160/90 | HR 68 | Ht 71.0 in | Wt 229.4 lb

## 2013-02-04 DIAGNOSIS — K648 Other hemorrhoids: Secondary | ICD-10-CM | POA: Diagnosis not present

## 2013-02-04 DIAGNOSIS — L309 Dermatitis, unspecified: Secondary | ICD-10-CM

## 2013-02-04 DIAGNOSIS — K219 Gastro-esophageal reflux disease without esophagitis: Secondary | ICD-10-CM | POA: Diagnosis not present

## 2013-02-04 DIAGNOSIS — L259 Unspecified contact dermatitis, unspecified cause: Secondary | ICD-10-CM | POA: Diagnosis not present

## 2013-02-04 DIAGNOSIS — R1319 Other dysphagia: Secondary | ICD-10-CM

## 2013-02-04 MED ORDER — PANTOPRAZOLE SODIUM 40 MG PO TBEC: 40.0000 mg | DELAYED_RELEASE_TABLET | Freq: Every day | ORAL | Status: DC

## 2013-02-04 MED ORDER — NYSTATIN-TRIAMCINOLONE 100000-0.1 UNIT/GM-% EX OINT: TOPICAL_OINTMENT | Freq: Two times a day (BID) | CUTANEOUS | Status: DC

## 2013-02-04 NOTE — Patient Instructions (Addendum)
You have been scheduled for an endoscopy with propofol. Please follow written instructions given to you at your visit today. If you use inhalers (even only as needed), please bring them with you on the day of your procedure. Your physician has requested that you go to www.startemmi.com and enter the access code given to you at your visit today. This web site gives a general overview about your procedure. However, you should still follow specific instructions given to you by our office regarding your preparation for the procedure.  We have sent the following medications to your pharmacy for you to pick up at your convenience: Pantoprazole, Nystatin  We are providing you with information on Balneol and [facility] hemorrhoid treatment we do here in the office.   I appreciate the opportunity to care for you.

## 2013-02-04 NOTE — Assessment & Plan Note (Addendum)
He is interested in ligation - explained Plan to schedule after EGD Would premed with NTG

## 2013-02-04 NOTE — Progress Notes (Signed)
  Subjective:    Patient ID: Alex Castillo, male    DOB: 12/08/1945, 67 y.o.   MRN: [id number]  HPI Here with recurrent dysphagia. Last dilation 2011. Feels like food sitting in lower sternal area. It eventually passes - but hard to exercise after eating due to fullness and dyspnea  Also c/o anal itching and irritation - he can feel anal protrusion when lifting weights at times Denies constipation or straining except when he forgets benefiber. Does not overwipe. No bleeding.  Medications, allergies, past medical history, past surgical history, family history and social history are reviewed and updated in the EMR.  Review of Systems Still active playing tennis, some neck pain at times and other joint pains Does lift weights at gym    Objective:   Physical Exam General:  NAD Eyes:   anicteric Lungs:  clear Heart:  S1S2 no rubs, murmurs or gallops  Rectal:  increased resting tone, no mass + perianal erythema and light scale and denuded skin in crease   Anoscopy: Gr II internal hemorroids in all positions  Data Reviewed:  Prior [id number] , ba swallow 2005 and 2007    Assessment & Plan:  RECURRENT DYSPHAGIA ? ESOPHAGEAL DYSMOTILITY - Plan: Ambulatory referral to Gastroenterology  Internal hemorrhoids with Grade 2 prolapse and pruritis ani  Perianal dermatitis - Plan: nystatin-triamcinolone ointment (MYCOLOG)  GERD  See problem-oriented charting also  Cc:VAN Domenic Schwab, MD

## 2013-02-04 NOTE — Assessment & Plan Note (Addendum)
Egd/dili again Take esophageal bxs ? EE Consider eventual manometry Change to pantoprazole

## 2013-02-04 NOTE — Assessment & Plan Note (Signed)
Try pantoprazole 40 mg daily - is on formulary Zegerid is not

## 2013-02-04 NOTE — Assessment & Plan Note (Signed)
?   Related to hemorrhoids and dc Also probably sweating Will use Nystatin-triamcinolone and Balneol May need Nystatin powder

## 2013-02-05 DIAGNOSIS — T6391XA Toxic effect of contact with unspecified venomous animal, accidental (unintentional), initial encounter: Secondary | ICD-10-CM | POA: Diagnosis not present

## 2013-02-19 ENCOUNTER — Encounter: Payer: Self-pay | Admitting: Internal Medicine

## 2013-02-19 ENCOUNTER — Ambulatory Visit (AMBULATORY_SURGERY_CENTER): Payer: Medicare Other | Admitting: Internal Medicine

## 2013-02-19 VITALS — BP 134/73 | HR 62 | Temp 96.6°F | Resp 25 | Ht 71.0 in | Wt 229.0 lb

## 2013-02-19 DIAGNOSIS — I10 Essential (primary) hypertension: Secondary | ICD-10-CM | POA: Diagnosis not present

## 2013-02-19 DIAGNOSIS — K589 Irritable bowel syndrome without diarrhea: Secondary | ICD-10-CM | POA: Diagnosis not present

## 2013-02-19 DIAGNOSIS — R1319 Other dysphagia: Secondary | ICD-10-CM

## 2013-02-19 DIAGNOSIS — K219 Gastro-esophageal reflux disease without esophagitis: Secondary | ICD-10-CM | POA: Diagnosis not present

## 2013-02-19 DIAGNOSIS — K2970 Gastritis, unspecified, without bleeding: Secondary | ICD-10-CM

## 2013-02-19 DIAGNOSIS — D131 Benign neoplasm of stomach: Secondary | ICD-10-CM | POA: Diagnosis not present

## 2013-02-19 DIAGNOSIS — K2990 Gastroduodenitis, unspecified, without bleeding: Secondary | ICD-10-CM

## 2013-02-19 DIAGNOSIS — R1310 Dysphagia, unspecified: Secondary | ICD-10-CM | POA: Diagnosis not present

## 2013-02-19 DIAGNOSIS — I440 Atrioventricular block, first degree: Secondary | ICD-10-CM | POA: Diagnosis not present

## 2013-02-19 DIAGNOSIS — K209 Esophagitis, unspecified without bleeding: Secondary | ICD-10-CM | POA: Diagnosis not present

## 2013-02-19 HISTORY — DX: Gastritis, unspecified, without bleeding: K29.70

## 2013-02-19 HISTORY — DX: Gastroduodenitis, unspecified, without bleeding: K29.90

## 2013-02-19 HISTORY — PX: ESOPHAGOGASTRODUODENOSCOPY: SHX1529

## 2013-02-19 MED ORDER — SODIUM CHLORIDE 0.9 % IV SOLN: 500.0000 mL | INTRAVENOUS | Status: DC

## 2013-02-19 NOTE — Progress Notes (Signed)
Called to room to assist during endoscopic procedure.  Patient ID and intended procedure confirmed with present staff. Received instructions for my participation in the procedure from the performing physician.  

## 2013-02-19 NOTE — Op Note (Signed)
[facility] [address] [place], [address]   ENDOSCOPY PROCEDURE REPORT  PATIENT: Alex Castillo, Alex Castillo  MR#: [id number] BIRTHDATE: [birth year] , 67  yrs. old GENDER: Male ENDOSCOPIST: Iva Boop, MD, [facility] PROCEDURE DATE:  02/19/2013 PROCEDURE:  EGD w/ biopsy and Maloney dilation of esophagus ASA CLASS:     Class II INDICATIONS:  Dysphagia. MEDICATIONS: propofol (Diprivan) 150mg  IV, MAC sedation, administered by CRNA, and These medications were titrated to patient response per physician's verbal order TOPICAL ANESTHETIC: Cetacaine Spray  DESCRIPTION OF PROCEDURE: After the risks benefits and alternatives of the procedure were thoroughly explained, informed consent was obtained.  The LB [id number] endoscope was introduced through the mouth and advanced to the stomach antrum. Without limitations.  The instrument was slowly withdrawn as the mucosa was fully examined.    STOMACH: Mild gastritis (inflammation) was found in the gastric antrum.  Multiple biopsies were performed using cold forceps. Sample sent for histology.  ESOPHAGUS: Eosinophilic esophagitis suspected with subtle mucosal changes that included longitudinal furrows and white spots were found in the entire esophagus.   Biopsies taken througout.  The remainder of the upper endoscopy exam was otherwise normal. Retroflexed views revealed no abnormalities.     The scope was then withdrawn from the patient and the procedure completed.  COMPLICATIONS: There were no complications. ENDOSCOPIC IMPRESSION: 1.   Gastritis (inflammation) was found in the gastric antrum; multiple biopsies 2.   Eosinophilic esophagitis was suspected in the entire esophagus - biopsied 3.   The remainder of the upper endoscopy exam was otherwise normal to antrum - 54 Fr Maloney dilation performed  RECOMMENDATIONS: 1.  Await pathology results- will call 2.  Clear liquids until 1130  , then soft foods rest of day.   Resume prior diet tomorrow.   eSigned:  Iva Boop, MD, [facility] 02/19/2013 10:19 AM  CC:The Patient  and Crist Fat, MD

## 2013-02-19 NOTE — Patient Instructions (Addendum)
You look to have some stomach irritation - called gastritis.  I took biopsies of that and the esophagus and then dilated the esophagus like before.  I will contact you with results and plans.  I appreciate the opportunity to care for you. Iva Boop, MD, [facility]     You may resume your current medications today. Handouts were given to your care partner on gastritis and a dilatation diet to follow the rest of today. Please call if any questions or concerns.    YOU HAD AN ENDOSCOPIC PROCEDURE TODAY AT THE [facility]: Refer to the procedure report that was given to you for any specific questions about what was found during the examination.  If the procedure report does not answer your questions, please call your gastroenterologist to clarify.  If you requested that your care partner not be given the details of your procedure findings, then the procedure report has been included in a sealed envelope for you to review at your convenience later.  YOU SHOULD EXPECT: Some feelings of bloating in the abdomen. Passage of more gas than usual.  Walking can help get rid of the air that was put into your GI tract during the procedure and reduce the bloating. If you had a lower endoscopy (such as a colonoscopy or flexible sigmoidoscopy) you may notice spotting of blood in your stool or on the toilet paper. If you underwent a bowel prep for your procedure, then you may not have a normal bowel movement for a few days.  DIET: Y  Drink plenty of fluids but you should avoid alcoholic beverages for 24 hours.  Please follow the dilatation diet the rest of the day.  ACTIVITY: Your care partner should take you home directly after the procedure.  You should plan to take it easy, moving slowly for the rest of the day.  You can resume normal activity the day after the procedure however you should NOT DRIVE or use heavy machinery for 24 hours (because of the sedation medicines used during the test).     SYMPTOMS TO REPORT IMMEDIATELY: A gastroenterologist can be reached at any hour.  During normal business hours, 8:30 AM to 5:00 PM Monday through Friday, call [phone].  After hours and on weekends, please call the GI answering service at [phone] who will take a message and have the physician on call contact you.     Following upper endoscopy (EGD)  Vomiting of blood or coffee ground material  New chest pain or pain under the shoulder blades  Painful or persistently difficult swallowing  New shortness of breath  Fever of 100F or higher  Black, tarry-looking stools  FOLLOW UP: If any biopsies were taken you will be contacted by phone or by letter within the next 1-3 weeks.  Call your gastroenterologist if you have not heard about the biopsies in 3 weeks.  Our staff will call the home number listed on your records the next business day following your procedure to check on you and address any questions or concerns that you may have at that time regarding the information given to you following your procedure. This is a courtesy call and so if there is no answer at the home number and we have not heard from you through the emergency physician on call, we will assume that you have returned to your regular daily activities without incident.  SIGNATURES/CONFIDENTIALITY: You and/or your care partner have signed paperwork which will be entered into your electronic  medical record.  These signatures attest to the fact that that the information above on your After Visit Summary has been reviewed and is understood.  Full responsibility of the confidentiality of this discharge information lies with you and/or your care-partner.

## 2013-02-19 NOTE — Progress Notes (Signed)
No complaints noted in the recovery room. maw 

## 2013-02-19 NOTE — Progress Notes (Signed)
Lidocaine-40mg IV prior to Propofol InductionPropofol given over incremental dosages 

## 2013-02-20 ENCOUNTER — Telehealth: Payer: Self-pay | Admitting: *Deleted

## 2013-02-20 NOTE — Telephone Encounter (Signed)
  Follow up Call-  Call back number 02/19/2013  Post procedure Call Back phone  # [phone]  Permission to leave phone message Yes     Patient questions:  Message left to call us if necessary.

## 2013-02-25 NOTE — Progress Notes (Signed)
Quick Note:  Biopsies only show mild esophageal inflammation Stomach ok No eosinophilic esophagitis Please have him make a 2 month f/u  No letter or recall ______

## 2013-04-02 DIAGNOSIS — T6391XA Toxic effect of contact with unspecified venomous animal, accidental (unintentional), initial encounter: Secondary | ICD-10-CM | POA: Diagnosis not present

## 2013-04-06 DIAGNOSIS — T6391XA Toxic effect of contact with unspecified venomous animal, accidental (unintentional), initial encounter: Secondary | ICD-10-CM | POA: Diagnosis not present

## 2013-04-06 DIAGNOSIS — J309 Allergic rhinitis, unspecified: Secondary | ICD-10-CM | POA: Diagnosis not present

## 2013-04-06 DIAGNOSIS — J45909 Unspecified asthma, uncomplicated: Secondary | ICD-10-CM | POA: Diagnosis not present

## 2013-04-10 ENCOUNTER — Encounter: Payer: Self-pay | Admitting: Cardiovascular Disease

## 2013-04-10 ENCOUNTER — Ambulatory Visit (INDEPENDENT_AMBULATORY_CARE_PROVIDER_SITE_OTHER): Payer: Medicare Other | Admitting: Cardiovascular Disease

## 2013-04-10 VITALS — BP 140/70 | HR 60 | Ht 71.0 in | Wt 223.0 lb

## 2013-04-10 DIAGNOSIS — R002 Palpitations: Secondary | ICD-10-CM | POA: Diagnosis not present

## 2013-04-10 DIAGNOSIS — I10 Essential (primary) hypertension: Secondary | ICD-10-CM

## 2013-04-10 DIAGNOSIS — I440 Atrioventricular block, first degree: Secondary | ICD-10-CM | POA: Diagnosis not present

## 2013-04-10 NOTE — Assessment & Plan Note (Signed)
Well controlled.  Continue current medications and low sodium Dash type diet.    

## 2013-04-10 NOTE — Assessment & Plan Note (Signed)
No progression from last year No evidence of chronotropic incompetence Stable

## 2013-04-10 NOTE — Progress Notes (Signed)
Patient ID: Alex Castillo, male   DOB: 07/03/1945, 67 y.o.   MRN: [id number] Alex Castillo returns for followup of his hypertension and palpitations. He doing well. Palpitations resolved. He is active playing tennis and doing elliptical training. His blood pressure seems to be under reasonable control Since on Hyzaar 50/12.5 Last BMET ok with K3.5 . Diet is somewhat poor. He can do a better job with sodium and carbohydrates. He has been compliant with his medications. Is not having significant headache palpitations PND or orthopnea no chest pain no evidence of vascular disease claudication TIA or CVA.  Hates diuretic Makes him feel weak, poor sex drive and "arthritis" worse. This was stopped last year as a result.  On ARB     ROS: Denies fever, malais, weight loss, blurry vision, decreased visual acuity, cough, sputum, SOB, hemoptysis, pleuritic pain, palpitaitons, heartburn, abdominal pain, melena, lower extremity edema, claudication, or rash.  All other systems reviewed and negative  General: Affect appropriate Healthy:  appears stated age HEENT: normal Neck supple with no adenopathy JVP normal no bruits no thyromegaly Lungs clear with no wheezing and good diaphragmatic motion Heart:  S1/S2 no murmur, no rub, gallop or click PMI normal Abdomen: benighn, BS positve, no tenderness, no AAA no bruit.  No HSM or HJR Distal pulses intact with no bruits No edema Neuro non-focal Skin warm and dry No muscular weakness   Current Outpatient Prescriptions  Medication Sig Dispense Refill  . ALPRAZolam (XANAX) 0.25 MG tablet Take 0.25 mg by mouth at bedtime as needed for sleep.      [name] aspirin 81 MG tablet Take 81 mg by mouth daily.        . clonazePAM (KLONOPIN) 0.5 MG tablet Take 0.25 mg by mouth at bedtime as needed for anxiety.      . CO ENZYME Q-10 PO Take 1 capsule by mouth daily.      [name] EPINEPHrine (EPI-PEN) 0.3 mg/0.3 mL SOAJ Inject 0.3 mg into the muscle once.      . fish oil-omega-3 fatty  acids 1000 MG capsule Take 2 g by mouth daily.       . Fluticasone-Salmeterol (ADVAIR) 100-50 MCG/DOSE AEPB Inhale 1 puff into the lungs every 12 (twelve) hours.      [name] ibuprofen (ADVIL,MOTRIN) 200 MG tablet Take 200 mg by mouth every 6 (six) hours as needed for pain.       [name] losartan (COZAAR) 100 MG tablet Take 1 tablet (100 mg total) by mouth daily.  90 tablet  3  . Multiple Vitamins-Minerals (CENTRUM SILVER ADULT 50+ PO) Take 1 tablet by mouth daily.      [name] nystatin-triamcinolone ointment (MYCOLOG) Apply topically 2 (two) times daily. For perianal dermatitis  30 g  1  . omeprazole-sodium bicarbonate (ZEGERID) 40-1100 MG per capsule Take 1 capsule by mouth daily before breakfast.  30 capsule  0  . pantoprazole (PROTONIX) 40 MG tablet Take 1 tablet (40 mg total) by mouth daily before breakfast.  30 tablet  11  . tadalafil (CIALIS) 10 MG tablet Take 5 mg by mouth daily as needed for erectile dysfunction.       . Tamsulosin HCl (FLOMAX) 0.4 MG CAPS Take 0.4 mg by mouth daily.        . Triamcinolone Acetonide (NASACORT ALLERGY 24HR NA) Place 2 sprays into the nose as needed.      . vitamin E 200 UNIT capsule Take 400 Units by mouth daily.       . Wheat  Dextrin (BENEFIBER PO) Take 2 each by mouth daily.        No current facility-administered medications for this visit.    Allergies  Bee venom and Hydromorphone hcl  Electrocardiogram: 04/14/12 SR rate 78 normal ECG  Today SR rate 60 PR 226 otherwise normal  Assessment and Plan

## 2013-04-10 NOTE — Patient Instructions (Signed)
Your physician recommends that you continue on your current medications as directed. Please refer to the Current Medication list given to you today.  Your physician wants you to follow-up in: 1 year with Dr. Nishan. You will receive a reminder letter in the mail two months in advance. If you don't receive a letter, please call our office to schedule the follow-up appointment.  

## 2013-04-27 DIAGNOSIS — T6391XA Toxic effect of contact with unspecified venomous animal, accidental (unintentional), initial encounter: Secondary | ICD-10-CM | POA: Diagnosis not present

## 2013-04-28 ENCOUNTER — Ambulatory Visit: Payer: Medicare Other | Admitting: Internal Medicine

## 2013-05-15 DIAGNOSIS — J45909 Unspecified asthma, uncomplicated: Secondary | ICD-10-CM | POA: Diagnosis not present

## 2013-05-21 ENCOUNTER — Other Ambulatory Visit: Payer: Self-pay | Admitting: Allergy and Immunology

## 2013-05-21 DIAGNOSIS — R911 Solitary pulmonary nodule: Secondary | ICD-10-CM

## 2013-05-25 ENCOUNTER — Ambulatory Visit (HOSPITAL_COMMUNITY)
Admission: RE | Admit: 2013-05-25 | Discharge: 2013-05-25 | Disposition: A | Discharge status: Home / Self Care | Payer: Medicare Other | Source: Ambulatory Visit | Attending: Allergy and Immunology | Admitting: Allergy and Immunology

## 2013-05-25 DIAGNOSIS — R918 Other nonspecific abnormal finding of lung field: Secondary | ICD-10-CM | POA: Insufficient documentation

## 2013-05-25 DIAGNOSIS — J984 Other disorders of lung: Secondary | ICD-10-CM | POA: Diagnosis not present

## 2013-05-25 DIAGNOSIS — R911 Solitary pulmonary nodule: Secondary | ICD-10-CM

## 2013-06-03 ENCOUNTER — Ambulatory Visit (INDEPENDENT_AMBULATORY_CARE_PROVIDER_SITE_OTHER): Payer: Medicare Other | Admitting: Internal Medicine

## 2013-06-03 ENCOUNTER — Encounter: Payer: Self-pay | Admitting: Internal Medicine

## 2013-06-03 VITALS — BP 132/68 | HR 68 | Ht 71.0 in | Wt 225.8 lb

## 2013-06-03 DIAGNOSIS — K648 Other hemorrhoids: Secondary | ICD-10-CM

## 2013-06-03 DIAGNOSIS — L259 Unspecified contact dermatitis, unspecified cause: Secondary | ICD-10-CM | POA: Diagnosis not present

## 2013-06-03 DIAGNOSIS — L309 Dermatitis, unspecified: Secondary | ICD-10-CM

## 2013-06-03 DIAGNOSIS — R1319 Other dysphagia: Secondary | ICD-10-CM | POA: Diagnosis not present

## 2013-06-03 DIAGNOSIS — K219 Gastro-esophageal reflux disease without esophagitis: Secondary | ICD-10-CM

## 2013-06-03 MED ORDER — OMEPRAZOLE-SODIUM BICARBONATE 40-1100 MG PO CAPS: 1.0000 | ORAL_CAPSULE | Freq: Every day | ORAL | Status: DC

## 2013-06-03 NOTE — Progress Notes (Signed)
         Subjective:    Patient ID: Alex Castillo, male    DOB: [birth year], 68 y.o.   MRN: [id number]  HPI Alex Castillo is here for followup after had an esophageal dilation. It has resolved his dysphagia again. He is not sure Protonix works as well as Zegerid and would like to try Zegerid for his reflux again. He has stopped drinking iced tea, and other caffeine and sodas because of a recent kidney stone. He is drinking juice at bedtime and occasionally gets some reflux at night. His perianal dermatitis has resolved with nystatin triamcinolone topical treatment. Hemorrhoids are not bothering him anymore on Benefiber.  Medications, allergies, past medical history, past surgical history, family history and social history are reviewed and updated in the EMR.  Review of Systems As above    Objective:   Physical Exam Well-developed well-nourished no acute distress       Assessment & Plan:   1. GERD (gastroesophageal reflux disease)   2. Perianal dermatitis   3. RECURRENT DYSPHAGIA ? ESOPHAGEAL DYSMOTILITY   4. Internal hemorrhoids with Grade 2 prolapse and pruritis ani   5. GERD    I appreciate the opportunity to care for this patient. CC: Alex Roger, MD

## 2013-06-03 NOTE — Assessment & Plan Note (Signed)
Resolved with nystatin triamcinolone topical therapy. He said he went to multiple positions including dermatologist every 3 years only thing that works.

## 2013-06-03 NOTE — Assessment & Plan Note (Addendum)
Improved, once again he responded to [facility] dilation.

## 2013-06-03 NOTE — Assessment & Plan Note (Signed)
Wants to retry Zegerid - thought it was best

## 2013-06-03 NOTE — Patient Instructions (Addendum)
We have sent the following medications to your pharmacy for you to pick up at your convenience: Zegerid  Follow up with [place] as needed.  I appreciate the opportunity to care for you.

## 2013-06-03 NOTE — Assessment & Plan Note (Signed)
Better - observe

## 2013-06-18 DIAGNOSIS — M1710 Unilateral primary osteoarthritis, unspecified knee: Secondary | ICD-10-CM | POA: Diagnosis not present

## 2013-06-18 DIAGNOSIS — M5137 Other intervertebral disc degeneration, lumbosacral region: Secondary | ICD-10-CM | POA: Diagnosis not present

## 2013-07-09 DIAGNOSIS — J45909 Unspecified asthma, uncomplicated: Secondary | ICD-10-CM | POA: Diagnosis not present

## 2013-08-20 DIAGNOSIS — T6391XA Toxic effect of contact with unspecified venomous animal, accidental (unintentional), initial encounter: Secondary | ICD-10-CM | POA: Diagnosis not present

## 2013-09-08 DIAGNOSIS — N139 Obstructive and reflux uropathy, unspecified: Secondary | ICD-10-CM | POA: Diagnosis not present

## 2013-09-08 DIAGNOSIS — N138 Other obstructive and reflux uropathy: Secondary | ICD-10-CM | POA: Diagnosis not present

## 2013-09-08 DIAGNOSIS — N401 Enlarged prostate with lower urinary tract symptoms: Secondary | ICD-10-CM | POA: Diagnosis not present

## 2013-09-14 ENCOUNTER — Encounter: Payer: Self-pay | Admitting: Internal Medicine

## 2013-09-14 DIAGNOSIS — N529 Male erectile dysfunction, unspecified: Secondary | ICD-10-CM | POA: Diagnosis not present

## 2013-09-14 DIAGNOSIS — N138 Other obstructive and reflux uropathy: Secondary | ICD-10-CM | POA: Diagnosis not present

## 2013-09-14 DIAGNOSIS — N401 Enlarged prostate with lower urinary tract symptoms: Secondary | ICD-10-CM | POA: Diagnosis not present

## 2013-09-14 DIAGNOSIS — N200 Calculus of kidney: Secondary | ICD-10-CM | POA: Diagnosis not present

## 2013-09-21 ENCOUNTER — Encounter: Payer: Self-pay | Admitting: Internal Medicine

## 2013-10-01 DIAGNOSIS — T6391XA Toxic effect of contact with unspecified venomous animal, accidental (unintentional), initial encounter: Secondary | ICD-10-CM | POA: Diagnosis not present

## 2013-10-07 ENCOUNTER — Encounter: Payer: Self-pay | Admitting: Cardiovascular Disease

## 2013-10-07 ENCOUNTER — Ambulatory Visit (INDEPENDENT_AMBULATORY_CARE_PROVIDER_SITE_OTHER): Payer: Medicare Other | Admitting: Cardiovascular Disease

## 2013-10-07 VITALS — BP 145/77 | HR 67 | Ht 71.0 in | Wt 224.0 lb

## 2013-10-07 DIAGNOSIS — I10 Essential (primary) hypertension: Secondary | ICD-10-CM | POA: Diagnosis not present

## 2013-10-07 DIAGNOSIS — R002 Palpitations: Secondary | ICD-10-CM | POA: Diagnosis not present

## 2013-10-07 DIAGNOSIS — I440 Atrioventricular block, first degree: Secondary | ICD-10-CM

## 2013-10-07 MED ORDER — LOSARTAN POTASSIUM 100 MG PO TABS: 100.0000 mg | ORAL_TABLET | Freq: Every day | ORAL | Status: DC

## 2013-10-07 NOTE — Assessment & Plan Note (Signed)
Stable yearly ECG  May not tolerate beta blockers in future

## 2013-10-07 NOTE — Patient Instructions (Signed)
Your physician wants you to follow-up in: YEAR WITH DR NISHAN  You will receive a reminder letter in the mail two months in advance. If you don't receive a letter, please call our office to schedule the follow-up appointment.  Your physician recommends that you continue on your current medications as directed. Please refer to the Current Medication list given to you today. 

## 2013-10-07 NOTE — Assessment & Plan Note (Signed)
Reasonable control continue cozaar 100 mg  With exercise and weight loss should be adequate

## 2013-10-07 NOTE — Assessment & Plan Note (Signed)
Resolved no evidence of structural heart disease

## 2013-10-07 NOTE — Progress Notes (Signed)
Patient ID: Alex Castillo, male   DOB: 1946-03-25, 68 y.o.   MRN: [id number] Yochanan returns for followup of his hypertension and palpitations. He doing well. Palpitations resolved. He is active playing tennis and doing elliptical training. His blood pressure seems to be under reasonable control Since on Hyzaar 50/12.5 Last BMET ok with K3.5 . Diet is somewhat poor. He can do a better job with sodium and carbohydrates. He has been compliant with his medications. Is not having significant headache palpitations PND or orthopnea no chest pain no evidence of vascular disease claudication TIA or CVA.  Hates diuretic Makes him feel weak, poor sex drive and "arthritis" worse. This was stopped last year as a result. On ARB  BP running right at 130 / 80 at home and lower when he plays tennis 2-3 x week      ROS: Denies fever, malais, weight loss, blurry vision, decreased visual acuity, cough, sputum, SOB, hemoptysis, pleuritic pain, palpitaitons, heartburn, abdominal pain, melena, lower extremity edema, claudication, or rash.  All other systems reviewed and negative  General: Affect appropriate Healthy:  appears stated age 45: normal Neck supple with no adenopathy JVP normal no bruits no thyromegaly Lungs clear with no wheezing and good diaphragmatic motion Heart:  S1/S2 no murmur, no rub, gallop or click PMI normal Abdomen: benighn, BS positve, no tenderness, no AAA no bruit.  No HSM or HJR Distal pulses intact with no bruits No edema Neuro non-focal Skin warm and dry No muscular weakness   Current Outpatient Prescriptions  Medication Sig Dispense Refill  . ALPRAZolam (XANAX) 0.25 MG tablet Take 0.25 mg by mouth at bedtime as needed for sleep.      [name] amoxicillin (AMOXIL) 500 MG capsule Dentist only      . aspirin 81 MG tablet Take 81 mg by mouth daily.        . Cholecalciferol (VITAMIN D3) 5000 UNITS TABS Take 1 tablet by mouth daily.      . clonazePAM (KLONOPIN) 0.5 MG tablet Take 0.25  mg by mouth at bedtime as needed for anxiety.      . CO ENZYME Q-10 PO Take 1 capsule by mouth daily.      [name] CRANBERRY PO Take 84 mg by mouth.      . EPINEPHrine (EPI-PEN) 0.3 mg/0.3 mL SOAJ Inject 0.3 mg into the muscle once.      . fish oil-omega-3 fatty acids 1000 MG capsule Take 2 g by mouth daily. [employer] Omega Heartguard      . Fluticasone-Salmeterol (ADVAIR) 100-50 MCG/DOSE AEPB Inhale 1 puff into the lungs every 12 (twelve) hours as needed.       [name] ibuprofen (ADVIL,MOTRIN) 200 MG tablet Take 200 mg by mouth every 6 (six) hours as needed for pain.       . Multiple Vitamins-Minerals (CENTRUM SILVER ADULT 50+ PO) Take 1 tablet by mouth daily.      [name] omeprazole-sodium bicarbonate (ZEGERID) 40-1100 MG per capsule Take 1 capsule by mouth daily before breakfast.  30 capsule  11  . pantoprazole (PROTONIX) 40 MG tablet Take 1 tablet (40 mg total) by mouth daily before breakfast.  30 tablet  11  . tadalafil (CIALIS) 10 MG tablet Take 5 mg by mouth daily as needed for erectile dysfunction.       . Tamsulosin HCl (FLOMAX) 0.4 MG CAPS Take 0.4 mg by mouth daily.        . Triamcinolone Acetonide (NASACORT ALLERGY 24HR NA) Place 2 sprays into the  nose as needed.      . vitamin E 200 UNIT capsule Take 400 Units by mouth daily.       . Wheat Dextrin (BENEFIBER PO) Take 2 each by mouth daily.       [name] losartan (COZAAR) 100 MG tablet Take 1 tablet (100 mg total) by mouth daily.  90 tablet  3   No current facility-administered medications for this visit.    Allergies  Bee venom and Hydromorphone hcl  Electrocardiogram:  SR rate 65 PR 218  Otherwise normal   Assessment and Plan

## 2013-10-09 ENCOUNTER — Encounter: Payer: Medicare Other | Admitting: Internal Medicine

## 2013-11-18 ENCOUNTER — Ambulatory Visit (AMBULATORY_SURGERY_CENTER): Payer: Self-pay

## 2013-11-18 VITALS — Ht 70.0 in | Wt 226.0 lb

## 2013-11-18 DIAGNOSIS — Z860100 Personal history of colon polyps, unspecified: Secondary | ICD-10-CM

## 2013-11-18 MED ORDER — SUPREP BOWEL PREP KIT 17.5-3.13-1.6 GM/177ML PO SOLN: 1.0000 | Freq: Once | ORAL | Status: DC

## 2013-11-18 NOTE — Progress Notes (Signed)
No allergies to eggs or soy No home oxygen No diet/weight loss meds No past problems with anesthesia No relatives with problems with anesthesia  Has email  Emmi instructions given for colonoscopy

## 2013-11-23 DIAGNOSIS — J029 Acute pharyngitis, unspecified: Secondary | ICD-10-CM | POA: Diagnosis not present

## 2013-11-25 ENCOUNTER — Telehealth: Payer: Self-pay | Admitting: Internal Medicine

## 2013-11-25 NOTE — Telephone Encounter (Signed)
I spoke with the patient and he is advised he can proceed with colonoscopy on 12/02/13 as long as he is not running a fever.  He is on antibiotics and will call back for any additional questions or concerns

## 2013-11-26 DIAGNOSIS — T6391XA Toxic effect of contact with unspecified venomous animal, accidental (unintentional), initial encounter: Secondary | ICD-10-CM | POA: Diagnosis not present

## 2013-11-27 ENCOUNTER — Telehealth: Payer: Self-pay | Admitting: Internal Medicine

## 2013-11-27 DIAGNOSIS — Z86010 Personal history of colonic polyps: Secondary | ICD-10-CM

## 2013-11-27 MED ORDER — NA SULFATE-K SULFATE-MG SULF 17.5-3.13-1.6 GM/177ML PO SOLN: 1.0000 | Freq: Once | ORAL | Status: DC

## 2013-11-27 NOTE — Telephone Encounter (Signed)
Pt will come to [facility] 4th floor desk Monday 7/20 to pick up sample of Suprep

## 2013-11-27 NOTE — Telephone Encounter (Signed)
No answer.  Left message for pt to return call

## 2013-11-27 NOTE — Addendum Note (Signed)
Addended by: Lowry Ram on: 11/27/2013 03:51 PM   Modules accepted: Orders

## 2013-12-02 ENCOUNTER — Ambulatory Visit (AMBULATORY_SURGERY_CENTER): Payer: Medicare Other | Admitting: Internal Medicine

## 2013-12-02 ENCOUNTER — Encounter: Payer: Self-pay | Admitting: Internal Medicine

## 2013-12-02 VITALS — BP 165/84 | HR 58 | Temp 96.7°F | Resp 13 | Ht 70.0 in | Wt 226.0 lb

## 2013-12-02 DIAGNOSIS — Z1211 Encounter for screening for malignant neoplasm of colon: Secondary | ICD-10-CM | POA: Diagnosis not present

## 2013-12-02 DIAGNOSIS — I10 Essential (primary) hypertension: Secondary | ICD-10-CM | POA: Diagnosis not present

## 2013-12-02 DIAGNOSIS — I2510 Atherosclerotic heart disease of native coronary artery without angina pectoris: Secondary | ICD-10-CM | POA: Diagnosis not present

## 2013-12-02 DIAGNOSIS — K5730 Diverticulosis of large intestine without perforation or abscess without bleeding: Secondary | ICD-10-CM

## 2013-12-02 DIAGNOSIS — Z86010 Personal history of colonic polyps: Secondary | ICD-10-CM

## 2013-12-02 DIAGNOSIS — D126 Benign neoplasm of colon, unspecified: Secondary | ICD-10-CM

## 2013-12-02 MED ORDER — SODIUM CHLORIDE 0.9 % IV SOLN: 500.0000 mL | INTRAVENOUS | Status: DC

## 2013-12-02 NOTE — Patient Instructions (Addendum)
I found and removed 1 tiny polyp that looks benign. You also have a condition called diverticulosis - common and not usually a problem. Please read the handout provided.  I will let you know pathology results and when to have another routine colonoscopy by mail.  I appreciate the opportunity to care for you. Gatha Mayer, MD, FACG  YOU HAD AN ENDOSCOPIC PROCEDURE TODAY AT [facility]: Refer to the procedure report that was given to you for any specific questions about what was found during the examination.  If the procedure report does not answer your questions, please call your gastroenterologist to clarify.  If you requested that your care partner not be given the details of your procedure findings, then the procedure report has been included in a sealed envelope for you to review at your convenience later.  YOU SHOULD EXPECT: Some feelings of bloating in the abdomen. Passage of more gas than usual.  Walking can help get rid of the air that was put into your GI tract during the procedure and reduce the bloating. If you had a lower endoscopy (such as a colonoscopy or flexible sigmoidoscopy) you may notice spotting of blood in your stool or on the toilet paper. If you underwent a bowel prep for your procedure, then you may not have a normal bowel movement for a few days.  DIET: Your first meal following the procedure should be a light meal and then it is ok to progress to your normal diet.  A half-sandwich or bowl of soup is an example of a good first meal.  Heavy or fried foods are harder to digest and may make you feel nauseous or bloated.  Likewise meals heavy in dairy and vegetables can cause extra gas to form and this can also increase the bloating.  Drink plenty of fluids but you should avoid alcoholic beverages for 24 hours.  ACTIVITY: Your care partner should take you home directly after the procedure.  You should plan to take it easy, moving slowly for the rest of the day.   You can resume normal activity the day after the procedure however you should NOT DRIVE or use heavy machinery for 24 hours (because of the sedation medicines used during the test).    SYMPTOMS TO REPORT IMMEDIATELY: A gastroenterologist can be reached at any hour.  During normal business hours, 8:30 AM to 5:00 PM Monday through Friday, call [phone].  After hours and on weekends, please call the GI answering service at [phone] who will take a message and have the physician on call contact you.   Following lower endoscopy (colonoscopy or flexible sigmoidoscopy):  Excessive amounts of blood in the stool  Significant tenderness or worsening of abdominal pains  Swelling of the abdomen that is new, acute  Fever of 100F or higher    FOLLOW UP: If any biopsies were taken you will be contacted by phone or by letter within the next 1-3 weeks.  Call your gastroenterologist if you have not heard about the biopsies in 3 weeks.  Our staff will call the home number listed on your records the next business day following your procedure to check on you and address any questions or concerns that you may have at that time regarding the information given to you following your procedure. This is a courtesy call and so if there is no answer at the home number and we have not heard from you through the emergency physician on call, we  will assume that you have returned to your regular daily activities without incident.  SIGNATURES/CONFIDENTIALITY: You and/or your care partner have signed paperwork which will be entered into your electronic medical record.  These signatures attest to the fact that that the information above on your After Visit Summary has been reviewed and is understood.  Full responsibility of the confidentiality of this discharge information lies with you and/or your care-partner.  Diverticulosis and polyp information given.

## 2013-12-02 NOTE — Progress Notes (Signed)
Patient awakening,vss,report to rn 

## 2013-12-02 NOTE — Progress Notes (Signed)
Called to room to assist during endoscopic procedure.  Patient ID and intended procedure confirmed with present staff. Received instructions for my participation in the procedure from the performing physician.  

## 2013-12-02 NOTE — Op Note (Signed)
[facility]  [address] [place], [address]   COLONOSCOPY PROCEDURE REPORT  PATIENT: Alex Castillo, Alex Castillo  MR#: [id number] BIRTHDATE: May 17, 1945 , 44  yrs. old GENDER: Male ENDOSCOPIST: Gatha Mayer, MD, [facility] PROCEDURE DATE:  12/02/2013 PROCEDURE:   Colonoscopy with biopsy First Screening Colonoscopy - Avg.  risk and is 50 yrs.  old or older - No.  Prior Negative Screening - Now for repeat screening. N/A  History of Adenoma - Now for follow-up colonoscopy & has been > or = to 3 yrs.  Yes hx of adenoma.  Has been 3 or more years since last colonoscopy.  Polyps Removed Today? Yes. ASA CLASS:   Class III INDICATIONS:Patient's personal history of adenomatous colon polyps.  MEDICATIONS: propofol (Diprivan) 200mg  IV  DESCRIPTION OF PROCEDURE:   After the risks benefits and alternatives of the procedure were thoroughly explained, informed consent was obtained.  A digital rectal exam revealed no abnormalities of the rectum, A digital rectal exam revealed no prostatic nodules, and A digital rectal exam revealed the prostate was not enlarged.   The [id number]  endoscope was introduced through the anus and advanced to the cecum, which was identified by both the appendix and ileocecal valve. No adverse events experienced.   The quality of the prep was excellent using Suprep  The instrument was then slowly withdrawn as the colon was fully examined.  COLON FINDINGS: A sessile polyp measuring 2 mm in size was found in the ascending colon.  A polypectomy was performed with cold forceps.  The resection was complete and the polyp tissue was completely retrieved.   Moderate diverticulosis was noted in the sigmoid colon.   The colon mucosa was otherwise normal.   A right colon retroflexion was performed.  Retroflexed views revealed no abnormalities. The time to cecum=1 minutes 09 seconds.  Withdrawal time=9 minutes 11 seconds.  The scope was withdrawn and the procedure  completed. COMPLICATIONS: There were no complications.  ENDOSCOPIC IMPRESSION: 1.   Sessile polyp measuring 2 mm in size was found in the ascending colon; polypectomy was performed with cold forceps 2.   Moderate diverticulosis was noted in the sigmoid colon 3.   The colon mucosa was otherwise normal  - excellent prep - hx adenoma 2010  RECOMMENDATIONS: Timing of repeat colonoscopy will be determined by pathology findings.   eSigned:  Gatha Mayer, MD, [facility] 12/02/2013 9:05 AM   cc: The Patient  and Townsend Roger, MD

## 2013-12-03 ENCOUNTER — Telehealth: Payer: Self-pay | Admitting: *Deleted

## 2013-12-03 NOTE — Telephone Encounter (Signed)
  Follow up Call-  Call back number 12/02/2013 02/19/2013  Post procedure Call Back phone  # [phone]  Permission to leave phone message Yes Yes     Patient questions:  Left message to call if necessary.

## 2013-12-09 ENCOUNTER — Encounter: Payer: Self-pay | Admitting: Internal Medicine

## 2013-12-16 DIAGNOSIS — T6391XA Toxic effect of contact with unspecified venomous animal, accidental (unintentional), initial encounter: Secondary | ICD-10-CM | POA: Diagnosis not present

## 2014-01-07 DIAGNOSIS — T6391XA Toxic effect of contact with unspecified venomous animal, accidental (unintentional), initial encounter: Secondary | ICD-10-CM | POA: Diagnosis not present

## 2014-01-20 ENCOUNTER — Encounter: Payer: Self-pay | Admitting: Internal Medicine

## 2014-02-03 DIAGNOSIS — L821 Other seborrheic keratosis: Secondary | ICD-10-CM | POA: Diagnosis not present

## 2014-02-03 DIAGNOSIS — D1801 Hemangioma of skin and subcutaneous tissue: Secondary | ICD-10-CM | POA: Diagnosis not present

## 2014-02-03 DIAGNOSIS — L570 Actinic keratosis: Secondary | ICD-10-CM | POA: Diagnosis not present

## 2014-02-03 DIAGNOSIS — D235 Other benign neoplasm of skin of trunk: Secondary | ICD-10-CM | POA: Diagnosis not present

## 2014-02-23 DIAGNOSIS — J209 Acute bronchitis, unspecified: Secondary | ICD-10-CM | POA: Diagnosis not present

## 2014-02-26 IMAGING — CT CT ABD-PELV W/O CM
2 of 4 series · 15 of 46 positions shown, 17 images · non-contrast
Comparison: No priors.

CLINICAL DATA: Left-sided flank pain.  Evaluate for potential
nephrolithiasis.

CT ABDOMEN AND PELVIS WITHOUT CONTRAST
TECHNIQUE: Multidetector CT imaging of the abdomen and pelvis was
performed following the standard protocol without intravenous
contrast.

[Series 2: stone study 5.0 i30f 1 · axial · 0.77mm/px · z∈[-96,+344]mm · 12 of 96 slices shown, 14 images]
[im 4/96  soft-tissue]
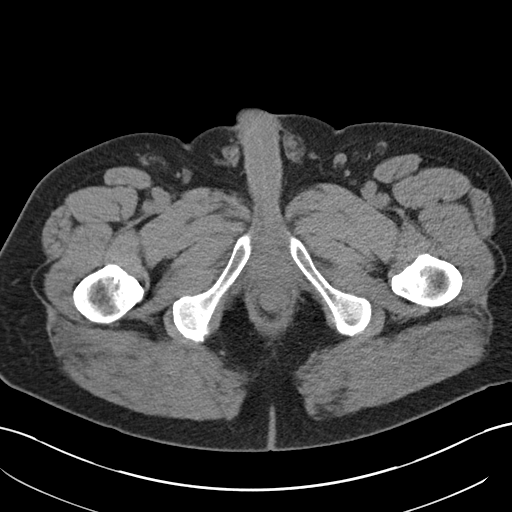
[im 4/96  bone]
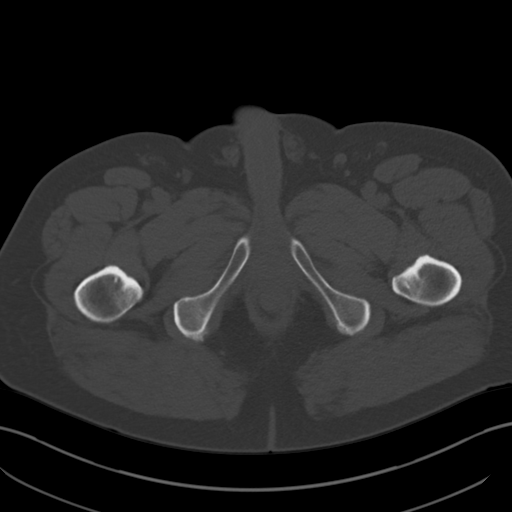
[im 12/96  soft-tissue]
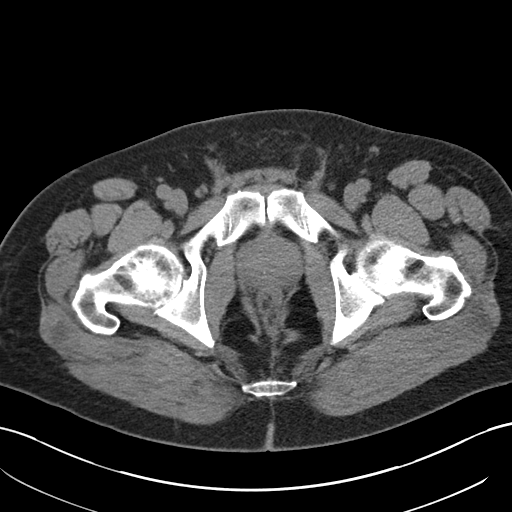
[im 20/96  soft-tissue]
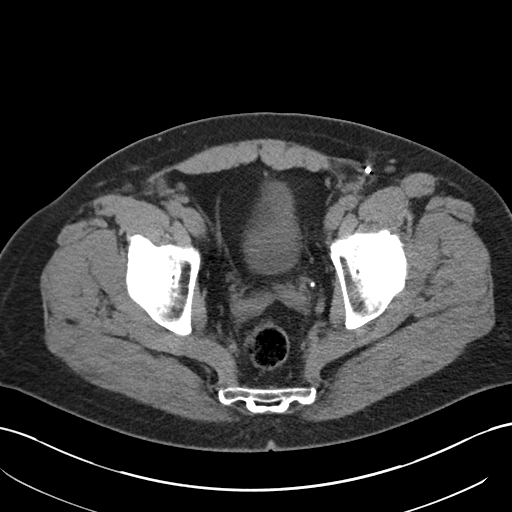
[im 28/96  soft-tissue]
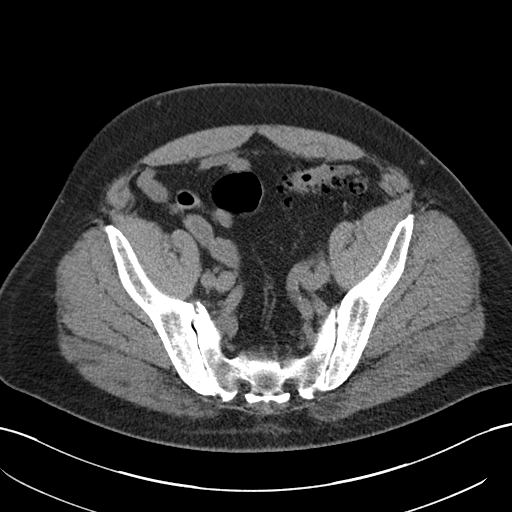
[im 36/96  soft-tissue]
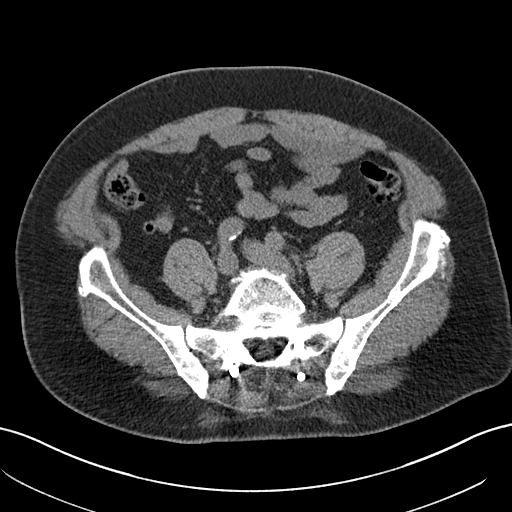
[im 44/96  soft-tissue]
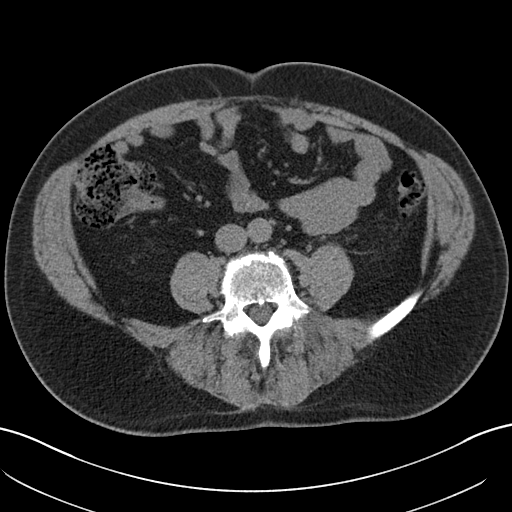
[im 52/96  soft-tissue]
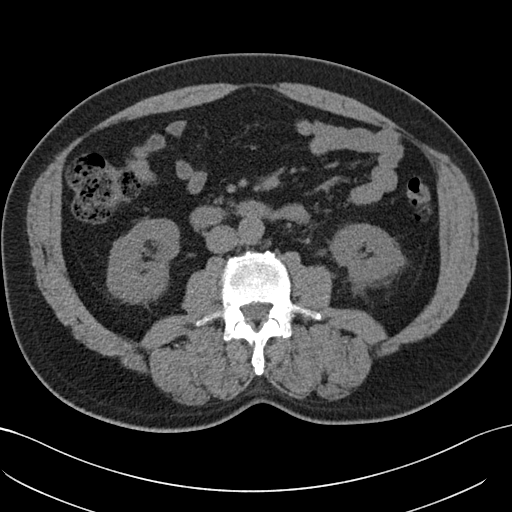
[im 60/96  soft-tissue]
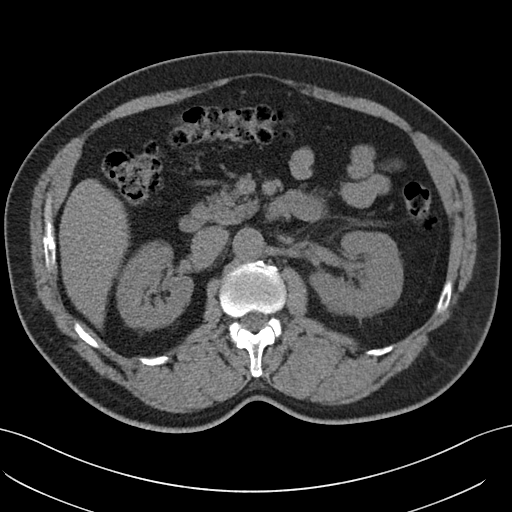
[im 68/96  soft-tissue]
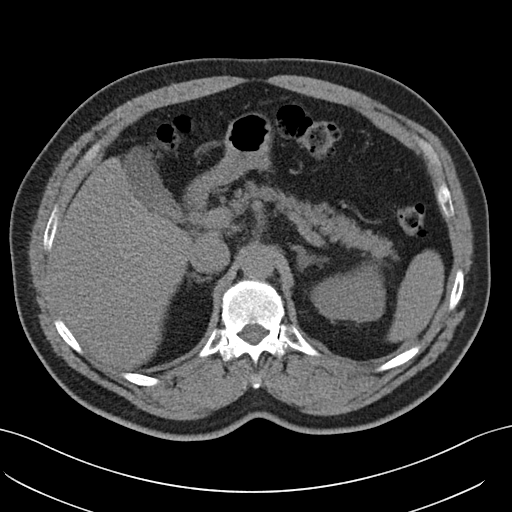
[im 68/96  bone]
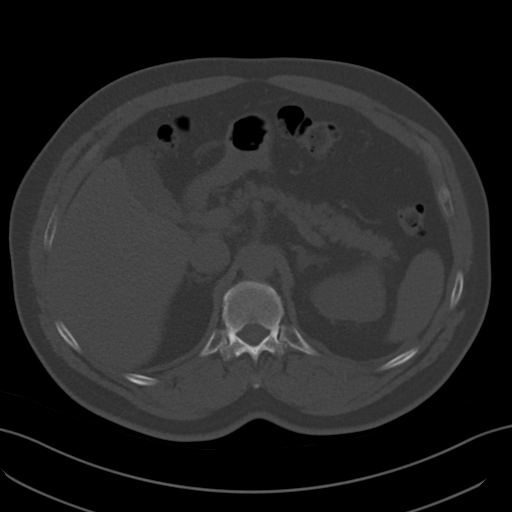
[im 76/96  soft-tissue]
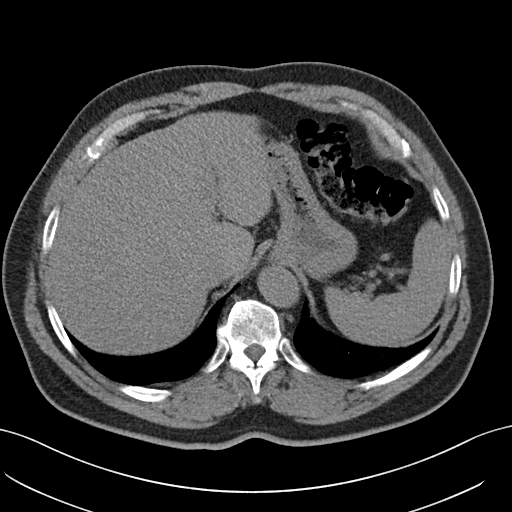
[im 84/96  soft-tissue]
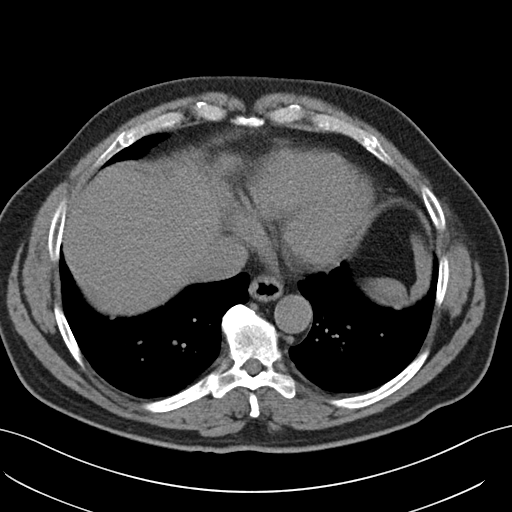
[im 92/96  soft-tissue]
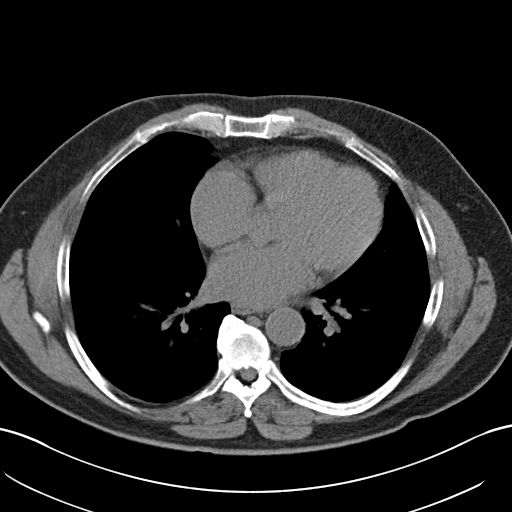

[Series 5: coronal soft tissue · coronal · 0.87mm/px · 3 of 101 slices shown]
[im 34/101  soft-tissue]
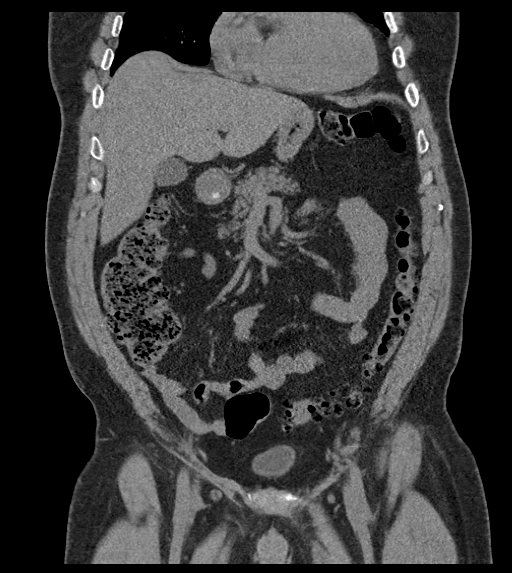
[im 45/101  soft-tissue]
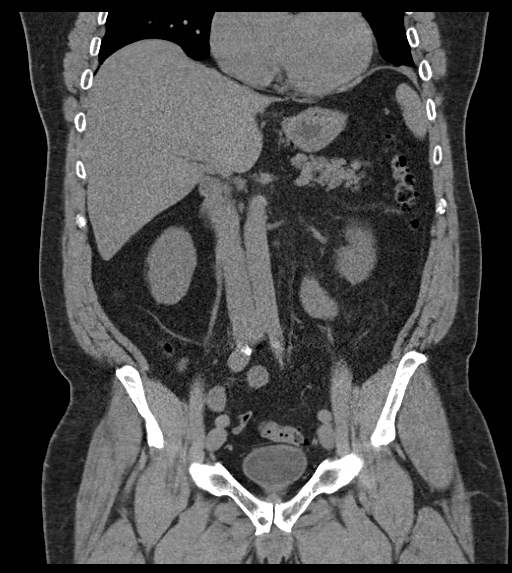
[im 56/101  soft-tissue]
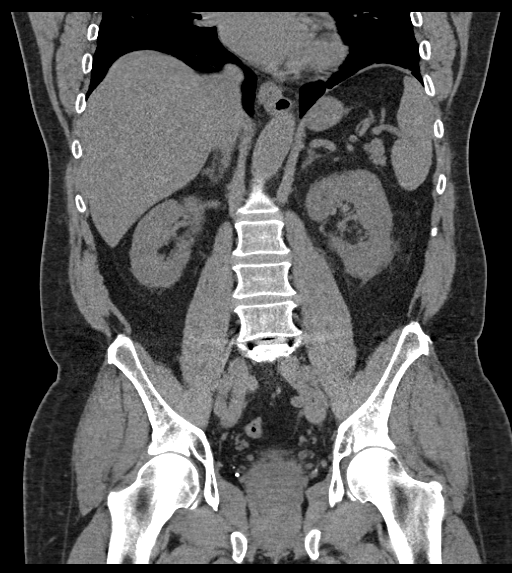

[15 of 46 positions shown; findings below may reference images not displayed]

FINDINGS: Lung Bases: 4 mm right middle lobe nodule (image two of series
three).  A 3 mm left lower lobe nodule (image 11 of series 3).
Small hiatal hernia.

Abdomen/Pelvis:  Image 77 of series 2 demonstrates a 3 mm calculus
in the distal third of the left ureter shortly before the left
ureterovesicular junction.  This is associated with only mild
fullness of the left ureter and collecting system, however, there
is extensive left perinephric stranding, which is presumably
related to recent obstruction (which may be relatively relieved at
this time).  No additional calculi are noted within the collecting
system of either kidney, along the course of either ureter, or
within the lumen of the urinary bladder.  No focal lesions are
identified within either kidney on today's noncontrast CT
examination.

The unenhanced appearance of the liver, gallbladder, pancreas,
spleen and bilateral adrenal glands is unremarkable.  There is mild
atherosclerosis throughout the abdominal and pelvic vasculature,
without definite aneurysm.  Retroaortic left renal vein (normal
anatomical variant) incidentally noted.  Numerous colonic
diverticula are noted, most severe in the region of the descending
and sigmoid colon, without surrounding inflammatory changes to
suggest acute diverticulitis at this time.  A small left inguinal
hernia containing only fat incidentally noted.  Prostate gland and
urinary bladder are unremarkable in appearance.  No definite
pathologic lymphadenopathy identified within the abdomen or pelvis
on today's noncontrast CT examination.

Musculoskeletal: Postoperative changes of PLIF at L5-S1.  6 mm of
anterolisthesis of L4 upon L5.  Alignment is otherwise anatomic.
There are no aggressive appearing lytic or blastic lesions noted in
the visualized portions of the skeleton.
IMPRESSION: 1.  3 mm partially obstructing calculous in the distal third of the
left ureter shortly before the left ureterovesicular junction.
Given the extensive left perinephric stranding, obstruction from
this calculus was likely more severe prior to performance of
today's CT examination.  No additional calculi are noted.

2.  Colonic diverticulosis without findings to suggest acute
diverticulitis at this time.
3.  Small hiatal hernia.
4.  Small pulmonary nodules in the lung bases bilaterally, largest
of which measures only 4 mm in the right middle lobe.  If the
patient is at high risk for bronchogenic carcinoma, follow-up chest
CT at 1 year is recommended.  If the patient is at low risk, no
follow-up is needed.  This recommendation follows the consensus
statement: Guidelines for Management of Small Pulmonary Nodules
Detected on CT Scans:  A Statement from the [HOSPITAL] as
5.  Additional incidental findings, as above.

## 2014-03-05 DIAGNOSIS — T63441A Toxic effect of venom of bees, accidental (unintentional), initial encounter: Secondary | ICD-10-CM | POA: Diagnosis not present

## 2014-03-25 ENCOUNTER — Other Ambulatory Visit: Payer: Self-pay | Admitting: Internal Medicine

## 2014-04-29 ENCOUNTER — Other Ambulatory Visit: Payer: Self-pay | Admitting: Allergy and Immunology

## 2014-04-29 DIAGNOSIS — J4540 Moderate persistent asthma, uncomplicated: Secondary | ICD-10-CM | POA: Diagnosis not present

## 2014-04-29 DIAGNOSIS — Z23 Encounter for immunization: Secondary | ICD-10-CM | POA: Diagnosis not present

## 2014-04-29 DIAGNOSIS — J309 Allergic rhinitis, unspecified: Secondary | ICD-10-CM | POA: Diagnosis not present

## 2014-04-29 DIAGNOSIS — R918 Other nonspecific abnormal finding of lung field: Secondary | ICD-10-CM

## 2014-04-29 DIAGNOSIS — T63441D Toxic effect of venom of bees, accidental (unintentional), subsequent encounter: Secondary | ICD-10-CM | POA: Diagnosis not present

## 2014-05-22 ENCOUNTER — Other Ambulatory Visit: Payer: Self-pay | Admitting: Internal Medicine

## 2014-05-25 ENCOUNTER — Ambulatory Visit (HOSPITAL_COMMUNITY)
Admission: RE | Admit: 2014-05-25 | Discharge: 2014-05-25 | Disposition: A | Discharge status: Home / Self Care | Payer: Medicare Other | Source: Ambulatory Visit | Attending: Allergy and Immunology | Admitting: Allergy and Immunology

## 2014-05-25 DIAGNOSIS — R918 Other nonspecific abnormal finding of lung field: Secondary | ICD-10-CM | POA: Diagnosis not present

## 2014-06-10 DIAGNOSIS — M25511 Pain in right shoulder: Secondary | ICD-10-CM | POA: Diagnosis not present

## 2014-06-10 DIAGNOSIS — M542 Cervicalgia: Secondary | ICD-10-CM | POA: Diagnosis not present

## 2014-06-24 DIAGNOSIS — T63441D Toxic effect of venom of bees, accidental (unintentional), subsequent encounter: Secondary | ICD-10-CM | POA: Diagnosis not present

## 2014-07-06 DIAGNOSIS — D485 Neoplasm of uncertain behavior of skin: Secondary | ICD-10-CM | POA: Diagnosis not present

## 2014-07-08 DIAGNOSIS — M25511 Pain in right shoulder: Secondary | ICD-10-CM | POA: Diagnosis not present

## 2014-07-08 DIAGNOSIS — M542 Cervicalgia: Secondary | ICD-10-CM | POA: Diagnosis not present

## 2014-07-12 DIAGNOSIS — M25511 Pain in right shoulder: Secondary | ICD-10-CM | POA: Diagnosis not present

## 2014-07-12 DIAGNOSIS — M542 Cervicalgia: Secondary | ICD-10-CM | POA: Diagnosis not present

## 2014-07-16 DIAGNOSIS — M542 Cervicalgia: Secondary | ICD-10-CM | POA: Diagnosis not present

## 2014-07-16 DIAGNOSIS — M25511 Pain in right shoulder: Secondary | ICD-10-CM | POA: Diagnosis not present

## 2014-07-20 DIAGNOSIS — M542 Cervicalgia: Secondary | ICD-10-CM | POA: Diagnosis not present

## 2014-07-20 DIAGNOSIS — M25511 Pain in right shoulder: Secondary | ICD-10-CM | POA: Diagnosis not present

## 2014-07-27 DIAGNOSIS — M542 Cervicalgia: Secondary | ICD-10-CM | POA: Diagnosis not present

## 2014-07-27 DIAGNOSIS — M25511 Pain in right shoulder: Secondary | ICD-10-CM | POA: Diagnosis not present

## 2014-07-30 DIAGNOSIS — M25511 Pain in right shoulder: Secondary | ICD-10-CM | POA: Diagnosis not present

## 2014-07-30 DIAGNOSIS — M542 Cervicalgia: Secondary | ICD-10-CM | POA: Diagnosis not present

## 2014-08-03 DIAGNOSIS — M25511 Pain in right shoulder: Secondary | ICD-10-CM | POA: Diagnosis not present

## 2014-08-03 DIAGNOSIS — M542 Cervicalgia: Secondary | ICD-10-CM | POA: Diagnosis not present

## 2014-08-04 DIAGNOSIS — C44311 Basal cell carcinoma of skin of nose: Secondary | ICD-10-CM | POA: Diagnosis not present

## 2014-08-14 DIAGNOSIS — I10 Essential (primary) hypertension: Secondary | ICD-10-CM | POA: Diagnosis not present

## 2014-08-14 DIAGNOSIS — R55 Syncope and collapse: Secondary | ICD-10-CM | POA: Diagnosis not present

## 2014-08-14 DIAGNOSIS — R42 Dizziness and giddiness: Secondary | ICD-10-CM | POA: Diagnosis not present

## 2014-08-16 ENCOUNTER — Telehealth: Payer: Self-pay | Admitting: Cardiovascular Disease

## 2014-08-16 NOTE — Telephone Encounter (Signed)
New Message  Pt wanted to  Speak w/ Rn. Playing tennis, pt felt light headed, and dizzy. Pt went to [facility] and was d/c same day. Pt was sched for next avail Nishan appt, but wanted to speak w/ Rn about what to do going forward. Please call back and discuss.

## 2014-08-16 NOTE — Telephone Encounter (Signed)
SPOKE WITH PT   FEELS BETTER TODAY   WILL MONITOR  B/P OVER THE NEXT COUPLE OF  WEEKS  AND  WILL BRING READINGS  TO [facility]  APPT  WILL CALL IF HAS ANY  FURTHER EPISODES WILL FORWARD TO DR Johnsie Cancel FOR REVIEW .Adonis Housekeeper

## 2014-08-18 DIAGNOSIS — I10 Essential (primary) hypertension: Secondary | ICD-10-CM | POA: Diagnosis not present

## 2014-08-18 DIAGNOSIS — M542 Cervicalgia: Secondary | ICD-10-CM | POA: Diagnosis not present

## 2014-08-18 DIAGNOSIS — R42 Dizziness and giddiness: Secondary | ICD-10-CM | POA: Diagnosis not present

## 2014-08-19 DIAGNOSIS — T63441D Toxic effect of venom of bees, accidental (unintentional), subsequent encounter: Secondary | ICD-10-CM | POA: Diagnosis not present

## 2014-09-08 NOTE — Progress Notes (Signed)
Patient ID: Alex Castillo, male   DOB: 12-14-1945, 69 y.o.   MRN: [id number] Alex Castillo returns for followup of his hypertension and palpitations and first degree AV block  (218) . He doing well. Palpitations resolved. He is active playing tennis and doing elliptical training. His blood pressure seems to be under reasonable control Since on Hyzaar 50/12.5 Last BMET ok with K3.5 . Diet is somewhat poor. He can do a better job with sodium and carbohydrates. He has been compliant with his medications. Is not having significant headache palpitations PND or orthopnea no chest pain no evidence of vascular disease claudication TIA or CVA.  Hates diuretic Makes him feel weak, poor sex drive and "arthritis" worse. This was stopped last year as a result. On ARB  Home BP readings excellent Some neck and lower back pains Working too much as Manufacturing systems engineer   Had basal cell on nose healing slowly  ROS: Denies fever, malais, weight loss, blurry vision, decreased visual acuity, cough, sputum, SOB, hemoptysis, pleuritic pain, palpitaitons, heartburn, abdominal pain, melena, lower extremity edema, claudication, or rash.  All other systems reviewed and negative  General: Affect appropriate Healthy:  appears stated age 69: S/P surgery basal cell right side of nose slow to heal Neck supple with no adenopathy JVP normal no bruits no thyromegaly Lungs clear with no wheezing and good diaphragmatic motion Heart:  S1/S2 no murmur, no rub, gallop or click PMI normal Abdomen: benighn, BS positve, no tenderness, no AAA no bruit.  No HSM or HJR Distal pulses intact with no bruits No edema Neuro non-focal Skin warm and dry No muscular weakness   Current Outpatient Prescriptions  Medication Sig Dispense Refill  . ALPRAZolam (XANAX) 0.25 MG tablet Take 0.25 mg by mouth at bedtime as needed for sleep.    [name] aspirin 81 MG tablet Take 81 mg by mouth daily.      . Cholecalciferol (VITAMIN D3) 5000 UNITS  TABS Take 1 tablet by mouth daily.    [name] CIALIS 5 MG tablet Take 5 mg by mouth daily.    . clonazePAM (KLONOPIN) 0.5 MG tablet Take 0.25 mg by mouth at bedtime as needed for anxiety.    . CO ENZYME Q-10 PO Take 1 capsule by mouth daily.    [name] CRANBERRY PO Take 84 mg by mouth.    . EPINEPHrine (EPI-PEN) 0.3 mg/0.3 mL SOAJ Inject 0.3 mg into the muscle once.    . fish oil-omega-3 fatty acids 1000 MG capsule Take 2 g by mouth daily. Shaklee Omega Heartguard    . Fluticasone Furoate 200 MCG/ACT AEPB Inhale 1 puff into the lungs as needed.    [name] ibuprofen (ADVIL,MOTRIN) 200 MG tablet Take 200 mg by mouth every 6 (six) hours as needed for pain.     [name] losartan (COZAAR) 100 MG tablet Take 1 tablet (100 mg total) by mouth daily. 90 tablet 3  . meloxicam (MOBIC) 15 MG tablet Take 15 mg by mouth as needed. For pain and inflammation    . Multiple Vitamins-Minerals (CENTRUM SILVER ADULT 50+ PO) Take 1 tablet by mouth daily.    [name] nystatin-triamcinolone ointment (MYCOLOG) APPLY TOPICALLY 2 TIMES DAILY FOR PERIANAL DERMATITIS 30 g 0  . pantoprazole (PROTONIX) 40 MG tablet TAKE ONE (1) TABLET EACH DAY BEFORE BREAKFAST 30 tablet 3  . ranitidine (ZANTAC) 150 MG capsule Take 150 mg by mouth 2 (two) times daily.    . tadalafil (CIALIS) 10 MG tablet Take 5 mg by mouth daily  as needed for erectile dysfunction.     . Tamsulosin HCl (FLOMAX) 0.4 MG CAPS Take 0.4 mg by mouth daily.      . Triamcinolone Acetonide (NASACORT ALLERGY 24HR NA) Place 2 sprays into the nose as needed.    . vitamin E 200 UNIT capsule Take 400 Units by mouth daily.     . Wheat Dextrin (BENEFIBER PO) Take 2 each by mouth daily.      No current facility-administered medications for this visit.    Allergies  Bee venom and Hydromorphone hcl  Electrocardiogram:   11/07/13  SR rate 65 PR 218  Otherwise normal  09/09/14 SR rate 72  PR 214  Otherwise normal  Assessment and Plan Palpitations:  Improved no structural heart disease HTN:  Well  controlled.  Continue current medications and low sodium Dash type diet.   Skin Cancer:  Gave him Dr Albertini's name for f/u and 2nd opinion First Degree:  Heart block ECG stable today PR 214  Yearly echo

## 2014-09-09 ENCOUNTER — Ambulatory Visit (INDEPENDENT_AMBULATORY_CARE_PROVIDER_SITE_OTHER): Payer: Medicare Other | Admitting: Cardiovascular Disease

## 2014-09-09 ENCOUNTER — Encounter: Payer: Self-pay | Admitting: Cardiovascular Disease

## 2014-09-09 VITALS — BP 146/76 | HR 69 | Ht 71.0 in | Wt 228.0 lb

## 2014-09-09 DIAGNOSIS — I10 Essential (primary) hypertension: Secondary | ICD-10-CM

## 2014-09-09 NOTE — Patient Instructions (Signed)
Medication Instructions:  NO CHANGES  Labwork: NONE  Testing/Procedures: NONE  Follow-Up: Your physician wants you to follow-up in: YEAR WITH  DR NISHAN You will receive a reminder letter in the mail two months in advance. If you don't receive a letter, please call our office to schedule the follow-up appointment.   Any Other Special Instructions Will Be Listed Below (If Applicable).   

## 2014-10-05 ENCOUNTER — Telehealth: Payer: Self-pay | Admitting: Cardiovascular Disease

## 2014-10-05 NOTE — Telephone Encounter (Signed)
Would try to continue cozzaar and diuretic Add norvasc 10 mg daily f/u nurse/ PA for BP check Next week

## 2014-10-05 NOTE — Telephone Encounter (Signed)
New Message  Pt calling c/o of BP spike this morning. Pt at [facility] MD (took recording of BP- 178/88) taking water pill. Pt also said he wanted to speak w/ RN about possible dizziness and trouble in legs. Pt requested to speak w/ Rn. Please call back and discuss.

## 2014-10-05 NOTE — Telephone Encounter (Signed)
Pt states his blood pressure was elevated at his PCP appointment and has been running higher at home. Pt was anxious because it was 174/108 at home and he took an extra losartan 100 mg. PCP added HCTZ 12.5 mg daily but pt does not like this med. He does not want to stay on it.  1) pt wants to know if the losartan has stopped working?  2) c/o dry cough and dizziness- feels it may be from losartan. Food intake was reviewed, he has been eating a moderate amount of sodium: chips and salsa, lunch meat, cashews, tacos. Pt will record BP/P 2-3 times a day for 3-4 days for Dr Kyla Balzarine review. Pt verbalized understanding and was accepting of plan.

## 2014-10-05 NOTE — Telephone Encounter (Signed)
Pt just started HCTZ today and is not willing at this time to add another new med. Pt will monitor BP/ reduce sodium and call back this week to speak with Cristine/ Dr Mariana Arn nurse to review readings. Pt will call with further need.

## 2014-10-12 ENCOUNTER — Other Ambulatory Visit: Payer: Self-pay | Admitting: Cardiovascular Disease

## 2014-10-12 ENCOUNTER — Telehealth: Payer: Self-pay | Admitting: Cardiovascular Disease

## 2014-10-12 NOTE — Telephone Encounter (Signed)
DISCUSSED WITH PT   B/P READINGS. B/P'S LOOK GOOD FOR  MOST PART  HAS NEW  COMPLAINT   IS  CONSTIPATED FEELS  MAY BE  FROM INCREASE  IN LOSARTAN  WILL  TRY  AND  TAKE  STOOL SOFTNER  IF NO IMPROVEMENT  WILL CALL BACK ./CY MESSAGE FORWARDED TO  DR Johnsie Cancel FOR REVIEW

## 2014-10-12 NOTE — Telephone Encounter (Signed)
New Message  Pt wanted to relay BP results and wants to speak w/ RN about problems w/ constipation due to medication. Please call back and discuss.   BP reading on 5/24- Tues (after taking 100 mg) 11am- 118/68 p. 78, 2:30pm- 122/62 p 79, 5pm 109/66 p. 78 5/25- Wed 5pm-109/63 p. 84, 5pm-103/54 p. 83, 6:30 pm-104/61 p. 78, 8:30 pm130/66 p. 68 5/26- Thur 630am-155/88 p. 74, 3pm- 96/63 p. 101, 430pm 95/59 p. 86, 830pm 116/74 p. 83 5/27- Fri 6am-142/82 p. 64, 830am-115/62 p. 70, 3pm- 110/50 p. 73, 9pm-116/63 p. 64 5/28- Saturdayt 1pm- 169/86, 10pm- 137/79 p. 65,  5/29- Sunday 9pm- 124/67 p. 65 5/30- Monday 8am-137/74 p. 65, 10am- 118/72 p. 76, 4pm- 108/59 p. 67 5/31- This AM: 6am 156/82 p. 75, 6am 136/75 p. 73,  10am151/89 p. 70, 1015am- 147/93 p. 70

## 2014-10-29 DIAGNOSIS — M542 Cervicalgia: Secondary | ICD-10-CM | POA: Diagnosis not present

## 2014-10-31 DIAGNOSIS — I10 Essential (primary) hypertension: Secondary | ICD-10-CM | POA: Diagnosis not present

## 2014-10-31 DIAGNOSIS — M5030 Other cervical disc degeneration, unspecified cervical region: Secondary | ICD-10-CM | POA: Diagnosis not present

## 2014-10-31 DIAGNOSIS — M5136 Other intervertebral disc degeneration, lumbar region: Secondary | ICD-10-CM | POA: Diagnosis not present

## 2014-10-31 DIAGNOSIS — R42 Dizziness and giddiness: Secondary | ICD-10-CM | POA: Diagnosis not present

## 2014-10-31 DIAGNOSIS — R51 Headache: Secondary | ICD-10-CM | POA: Diagnosis not present

## 2014-11-01 ENCOUNTER — Telehealth: Payer: Self-pay | Admitting: Cardiovascular Disease

## 2014-11-01 NOTE — Telephone Encounter (Signed)
New message      Pt want to be seen today.  He went to the ER yesterday because his bp was 200/100.  He states this is his second trip to the ER because of bp issues.  This am his bp was 162/93 at 6:20 am.  Please call

## 2014-11-01 NOTE — Telephone Encounter (Signed)
DISCUSSED MESSAGE  WITH LORI GERHARDT NP   PT TO   START    AMLODIPINE  5 MG  EVERY DAY   AND   COME  IN  AND  SEE PA IN  COUPLE OF  DAYS PER  LORI  PT AWARE  WILL FOR NOTE  TO SCHEDULERS  TO MAKE AN  APPT  .PER PT B/P  NORMAL  AT THIS  TIME . ALSO  ENCOURAGED  PT  TO TRY  AND  SPLIT  UP  MEDS  TAKE   2 MEDS IN AM   AND  THE  NEW  MED  TAKE   LATE  DAY    PT  VERBALIZED UNDERSTANDING.

## 2014-11-02 ENCOUNTER — Telehealth: Payer: Self-pay

## 2014-11-02 DIAGNOSIS — K649 Unspecified hemorrhoids: Secondary | ICD-10-CM | POA: Diagnosis not present

## 2014-11-02 DIAGNOSIS — N5201 Erectile dysfunction due to arterial insufficiency: Secondary | ICD-10-CM | POA: Diagnosis not present

## 2014-11-02 DIAGNOSIS — I10 Essential (primary) hypertension: Secondary | ICD-10-CM | POA: Diagnosis not present

## 2014-11-02 DIAGNOSIS — N401 Enlarged prostate with lower urinary tract symptoms: Secondary | ICD-10-CM | POA: Diagnosis not present

## 2014-11-02 DIAGNOSIS — N200 Calculus of kidney: Secondary | ICD-10-CM | POA: Diagnosis not present

## 2014-11-02 DIAGNOSIS — Z125 Encounter for screening for malignant neoplasm of prostate: Secondary | ICD-10-CM | POA: Diagnosis not present

## 2014-11-02 MED ORDER — AMLODIPINE BESYLATE 5 MG PO TABS: 5.0000 mg | ORAL_TABLET | Freq: Every day | ORAL | Status: DC

## 2014-11-03 DIAGNOSIS — M542 Cervicalgia: Secondary | ICD-10-CM | POA: Diagnosis not present

## 2014-11-03 NOTE — Telephone Encounter (Signed)
New Rx for Amlodipine 5mg  sent to [facility], per Cecille Rubin Gerhardt's order.

## 2014-11-04 ENCOUNTER — Ambulatory Visit (INDEPENDENT_AMBULATORY_CARE_PROVIDER_SITE_OTHER): Payer: Medicare Other | Admitting: Nurse Practitioner

## 2014-11-04 ENCOUNTER — Encounter: Payer: Self-pay | Admitting: Nurse Practitioner

## 2014-11-04 VITALS — BP 180/88 | HR 70 | Ht 70.0 in | Wt 220.2 lb

## 2014-11-04 DIAGNOSIS — I10 Essential (primary) hypertension: Secondary | ICD-10-CM

## 2014-11-04 LAB — BASIC METABOLIC PANEL
BUN: 13 mg/dL (ref 6–23)
CO2: 32 mEq/L (ref 19–32)
Calcium: 9.5 mg/dL (ref 8.4–10.5)
Chloride: 98 mEq/L (ref 96–112)
Creatinine, Ser: 0.99 mg/dL (ref 0.40–1.50)
GFR: 80.12 mL/min (ref >60.00)
Glucose, Bld: 130 mg/dL — ABNORMAL HIGH (ref 70–99)
Potassium: 3.9 mEq/L (ref 3.5–5.1)
Sodium: 135 mEq/L (ref 135–145)

## 2014-11-04 NOTE — Progress Notes (Signed)
CARDIOLOGY OFFICE NOTE  Date:  11/04/2014    [name] Date of Birth: [birth year] Medical Record #[id number]  PCP:  Townsend Roger, MD  Cardiologist:  Johnsie Cancel    Chief Complaint  Patient presents with  . Hypertension    Work in visit - seen for Dr. Johnsie Cancel    History of Present Illness: Alex Castillo is a 69 y.o. male who presents today for a work in visit. Seen for Dr. Johnsie Cancel.   Added to the FLEX after hours. Had called earlier in the week with elevated BP - had been to the ER at [facility] with elevated BP - I added Norvasc.    He has a history of HTN, palpitations and 1st degree AV block.   Last seen by Dr. Johnsie Cancel in April of 206 - felt to be doing well.  Comes in today. Here alone. Quite talkative. Very tangential. Has not started the Norvasc. He notes that he had probably got too much salt - had eaten pizza and sausage. Has since cut back on his salt. He brings in a list of BPs - takes multiple times just a minute or two apart. Now running low. Says he has had his BP cuff checked. BP is quite high today. Has not had all of his medicines today yet. He has chronic neck pain and having issues with internal hemorrhoids. Saw urology yesterday - said he got checked by a blood test for elevated BP. Wondering if he is depressed. Lots of stress with daughter's chronic illness. He has stopped working due to the stress associated. Has been out in the heat as well.  Past Medical History  Diagnosis Date  . HTN (hypertension)     ETT negative 10/08  . Palpitations   . First degree AV block   . IBS (irritable bowel syndrome)   . Anxiety   . GERD (gastroesophageal reflux disease)   . ED (erectile dysfunction)   . BPH (benign prostatic hyperplasia)   . Diverticulosis   . Adenomatous polyps   . Hiatal hernia   . Internal hemorrhoids   . Esophageal stricture   . History of kidney stones   . Unspecified gastritis and gastroduodenitis without mention of hemorrhage 02/19/2013     Past Surgical History  Procedure Laterality Date  . Right foot surgery    . Bilateral inguinal hernia repair    . Nasl septoplasty    . Lower back surgery with lumbar vertebral repair    . Colonoscopy w/ biopsies and polypectomy  09/15/2008    adonomatous polyps, diverticulosis, internal hemorrhoids  . Upper gastrointestinal endoscopy  02/07/2010    w/dil, tourtuous esophagus, hiatal hernia  . Cystoscopy/retrograde/ureteroscopy/stone extraction with basket Left 12/08/2012    Procedure: CYSTOSCOPY, LEFT RETROGRADE, LEFT URETEROSCOPY/STONE EXTRACTION WITH BASKET;  Surgeon: Alexis Frock, MD;  Location: [facility];  Service: Urology;  Laterality: Left;  . Esophagogastroduodenoscopy  02/19/2013     Medications: Current Outpatient Prescriptions  Medication Sig Dispense Refill  . ALPRAZolam (XANAX) 0.25 MG tablet Take 0.25 mg by mouth at bedtime as needed for sleep.    [name] aspirin 81 MG tablet Take 81 mg by mouth daily.      . Cholecalciferol (VITAMIN D3) 5000 UNITS TABS Take 1 tablet by mouth daily.    [name] CIALIS 5 MG tablet Take 5 mg by mouth daily.    . clonazePAM (KLONOPIN) 0.5 MG tablet Take 0.25 mg by mouth at bedtime as needed for anxiety.    [name]  CO ENZYME Q-10 PO Take 1 capsule by mouth daily.    [name] EPINEPHrine (EPI-PEN) 0.3 mg/0.3 mL SOAJ Inject 0.3 mg into the muscle once.    . fish oil-omega-3 fatty acids 1000 MG capsule Take 2 g by mouth daily. Shaklee Omega Heartguard    . Fluticasone Furoate 200 MCG/ACT AEPB Inhale 1 puff into the lungs as needed.    [name] ibuprofen (ADVIL,MOTRIN) 200 MG tablet Take 200 mg by mouth every 6 (six) hours as needed for pain.     [name] losartan (COZAAR) 100 MG tablet TAKE ONE (1) TABLET BY MOUTH EVERY DAY 90 tablet 3  . meloxicam (MOBIC) 15 MG tablet Take 15 mg by mouth as needed. For pain and inflammation    . Multiple Vitamins-Minerals (CENTRUM SILVER ADULT 50+ PO) Take 1 tablet by mouth daily.    [name] nystatin-triamcinolone ointment (MYCOLOG) APPLY TOPICALLY 2  TIMES DAILY FOR PERIANAL DERMATITIS 30 g 0  . pantoprazole (PROTONIX) 40 MG tablet TAKE ONE (1) TABLET EACH DAY BEFORE BREAKFAST 30 tablet 3  . ranitidine (ZANTAC) 150 MG capsule Take 150 mg by mouth as needed.     . tadalafil (CIALIS) 10 MG tablet Take 5 mg by mouth daily as needed for erectile dysfunction.     . Tamsulosin HCl (FLOMAX) 0.4 MG CAPS Take 0.4 mg by mouth daily.      . Triamcinolone Acetonide (NASACORT ALLERGY 24HR NA) Place 2 sprays into the nose as needed.    . vitamin E 200 UNIT capsule Take 400 Units by mouth daily.     . Wheat Dextrin (BENEFIBER PO) Take 2 each by mouth daily.     [name] amLODipine (NORVASC) 5 MG tablet Take 1 tablet (5 mg total) by mouth daily. (Patient not taking: Reported on 11/04/2014) 30 tablet 1  . CRANBERRY PO Take 84 mg by mouth.     No current facility-administered medications for this visit.    Allergies: Allergies  Allergen Reactions  . Bee Venom Anaphylaxis  . Hydromorphone Hcl Other (See Comments)    Reaction unknown    Social History: The patient  reports that he has never smoked. He has never used smokeless tobacco. He reports that he does not drink alcohol or use illicit drugs.   Family History: The patient's family history includes Arthritis in his father; Asthma in his maternal grandfather; Heart disease in his father; Hypertension in his mother; Liver disease in his daughter; Lung cancer in his father. There is no history of Colon cancer, Throat cancer, or Kidney disease.   Review of Systems: Please see the history of present illness.   Otherwise, the review of systems is positive for back pain, muscle pain, dizziness, constipation and anxiety.   All other systems are reviewed and negative.   Physical Exam: VS:  BP 180/88 mmHg  Pulse 70  Ht 5\' 10"  (1.778 m)  Wt 220 lb 3.2 oz (99.882 kg)  BMI 31.60 kg/m2  SpO2 96% .  BMI Body mass index is 31.6 kg/(m^2).  Wt Readings from Last 3 Encounters:  11/04/14 220 lb 3.2 oz (99.882 kg)    09/09/14 228 lb (103.42 kg)  12/02/13 226 lb (102.513 kg)   Repeat BP by me is 140/90 in both arms - this was after going to the bathroom to void.   General: Anxious!. Quite talkative. Well developed, well nourished and in no acute distress.  HEENT: Normal. Neck: Supple, no JVD, carotid bruits, or masses noted.  Cardiac: Regular rate and rhythm. No  murmurs, rubs, or gallops. No edema.  Respiratory:  Lungs are clear to auscultation bilaterally with normal work of breathing.  GI: Soft and nontender.  MS: No deformity or atrophy. Gait and ROM intact. Skin: Warm and dry. Color is normal.  Neuro:  Strength and sensation are intact and no gross focal deficits noted.  Psych: Alert, appropriate and with normal affect.   LABORATORY DATA:  EKG:  EKG is not ordered today.  Lab Results  Component Value Date   WBC 12.8* 12/07/2012   HGB 13.3 12/07/2012   HCT 38.2* 12/07/2012   PLT 180 12/07/2012   GLUCOSE 176* 12/07/2012   CHOL 167 09/28/2009   TRIG 93.0 09/28/2009   HDL 34.80* 09/28/2009   LDLCALC 114* 09/28/2009   ALT 25 09/28/2009   AST 22 09/28/2009   NA 130* 12/07/2012   K 3.8 12/07/2012   CL 94* 12/07/2012   CREATININE 1.47* 12/07/2012   BUN 19 12/07/2012   CO2 25 12/07/2012    BNP (last 3 results) No results for input(s): BNP in the last 8760 hours.  ProBNP (last 3 results) No results for input(s): PROBNP in the last 8760 hours.   Other Studies Reviewed Today:   Assessment/Plan: 1. HTN - labile control - needs to NOT take so frequently (i.e., every 2 minutes). Ok to hold on Norvasc - will use prn. Limit salt. Stop the energy drink. Check BMET. See Dr. Johnsie Cancel back in one month.    2. Palpitations - does not seem to be an issue.  3. History of AV block  4. Anxiety - I suspect this is what is driving most of his complaints. Encouraged to see PCP about possible antidepressant therapy.   Current medicines are reviewed with the patient today.  The patient does not  have concerns regarding medicines other than what has been noted above.  The following changes have been made:  See above.  Labs/ tests ordered today include:   No orders of the defined types were placed in this encounter.     Disposition:   FU with Dr. Johnsie Cancel 1 month or so.  Patient is agreeable to this plan and will call if any problems develop in the interim.   Signed: Burtis Junes, RN, ANP-C 11/04/2014 8:21 AM  [facility] [address] [place], [place]  [address] Phone: [phone] Fax: [phone]

## 2014-11-04 NOTE — Patient Instructions (Addendum)
We will be checking the following labs today - BMET   Medication Instructions:    Continue with your current medicines.  You can hold on taking the Norvasc for now but may use if your BP is over 288 systolic    Testing/Procedures To Be Arranged:  N/A  Follow-Up:   See Dr. Johnsie Cancel in one month with a BP diary - take your BP in the AM, afternoon, and evening    Other Special Instructions:   Stop your energy drink - it has way too much potassium  See your PCP about possible depression/stress  Limit your salt  Call the [place] office at [phone] if you have any questions, problems or concerns.

## 2014-11-05 DIAGNOSIS — F411 Generalized anxiety disorder: Secondary | ICD-10-CM | POA: Diagnosis not present

## 2014-11-05 DIAGNOSIS — M4722 Other spondylosis with radiculopathy, cervical region: Secondary | ICD-10-CM | POA: Diagnosis not present

## 2014-11-05 DIAGNOSIS — I10 Essential (primary) hypertension: Secondary | ICD-10-CM | POA: Diagnosis not present

## 2014-11-08 ENCOUNTER — Telehealth: Payer: Self-pay | Admitting: *Deleted

## 2014-11-08 DIAGNOSIS — M542 Cervicalgia: Secondary | ICD-10-CM | POA: Diagnosis not present

## 2014-11-08 NOTE — Telephone Encounter (Signed)
-----   Message from Tamsen Snider sent at 11/04/2014  8:37 AM EDT ----- Pt was seen in flex clinic, Cecille Rubin stated pt needs to be seen by Dr. Johnsie Cancel in one month and not seen back on the flex schedule.  Please call pt with appointment.  Thanks,  Juanda Crumble

## 2014-11-08 NOTE — Telephone Encounter (Signed)
APPT MADE FOR  01-12-15  AT   3:30 PM WITH  DR Johnsie Cancel  PT  AWARE . PT  WILL  CONTINUE  TO   MONITOR   B/P    AND  WILL  CALL IF  NO IMPROVEMENT .Adonis Housekeeper

## 2014-11-10 DIAGNOSIS — M542 Cervicalgia: Secondary | ICD-10-CM | POA: Diagnosis not present

## 2014-11-17 DIAGNOSIS — M47812 Spondylosis without myelopathy or radiculopathy, cervical region: Secondary | ICD-10-CM | POA: Diagnosis not present

## 2014-11-17 DIAGNOSIS — M542 Cervicalgia: Secondary | ICD-10-CM | POA: Diagnosis not present

## 2014-11-17 DIAGNOSIS — I10 Essential (primary) hypertension: Secondary | ICD-10-CM | POA: Diagnosis not present

## 2014-11-17 DIAGNOSIS — M47816 Spondylosis without myelopathy or radiculopathy, lumbar region: Secondary | ICD-10-CM | POA: Diagnosis not present

## 2014-11-17 DIAGNOSIS — Z6831 Body mass index (BMI) 31.0-31.9, adult: Secondary | ICD-10-CM | POA: Diagnosis not present

## 2014-11-17 DIAGNOSIS — M545 Low back pain: Secondary | ICD-10-CM | POA: Diagnosis not present

## 2014-11-19 DIAGNOSIS — M542 Cervicalgia: Secondary | ICD-10-CM | POA: Diagnosis not present

## 2014-11-23 DIAGNOSIS — L570 Actinic keratosis: Secondary | ICD-10-CM | POA: Diagnosis not present

## 2014-11-24 DIAGNOSIS — M542 Cervicalgia: Secondary | ICD-10-CM | POA: Diagnosis not present

## 2014-11-29 DIAGNOSIS — K640 First degree hemorrhoids: Secondary | ICD-10-CM | POA: Diagnosis not present

## 2014-12-01 DIAGNOSIS — M542 Cervicalgia: Secondary | ICD-10-CM | POA: Diagnosis not present

## 2014-12-09 DIAGNOSIS — T63441D Toxic effect of venom of bees, accidental (unintentional), subsequent encounter: Secondary | ICD-10-CM | POA: Diagnosis not present

## 2014-12-10 DIAGNOSIS — M542 Cervicalgia: Secondary | ICD-10-CM | POA: Diagnosis not present

## 2014-12-17 DIAGNOSIS — M545 Low back pain: Secondary | ICD-10-CM | POA: Diagnosis not present

## 2014-12-20 DIAGNOSIS — R42 Dizziness and giddiness: Secondary | ICD-10-CM | POA: Diagnosis not present

## 2014-12-23 DIAGNOSIS — M545 Low back pain: Secondary | ICD-10-CM | POA: Diagnosis not present

## 2015-01-07 DIAGNOSIS — F411 Generalized anxiety disorder: Secondary | ICD-10-CM | POA: Diagnosis not present

## 2015-01-12 ENCOUNTER — Ambulatory Visit: Payer: Medicare Other | Admitting: Cardiovascular Disease

## 2015-01-13 DIAGNOSIS — Z91038 Other insect allergy status: Secondary | ICD-10-CM

## 2015-01-13 DIAGNOSIS — J309 Allergic rhinitis, unspecified: Secondary | ICD-10-CM | POA: Insufficient documentation

## 2015-01-13 DIAGNOSIS — J45909 Unspecified asthma, uncomplicated: Secondary | ICD-10-CM | POA: Insufficient documentation

## 2015-01-13 DIAGNOSIS — Z91030 Bee allergy status: Secondary | ICD-10-CM

## 2015-01-19 DIAGNOSIS — R42 Dizziness and giddiness: Secondary | ICD-10-CM | POA: Diagnosis not present

## 2015-01-19 DIAGNOSIS — I10 Essential (primary) hypertension: Secondary | ICD-10-CM | POA: Diagnosis not present

## 2015-01-19 DIAGNOSIS — F411 Generalized anxiety disorder: Secondary | ICD-10-CM | POA: Diagnosis not present

## 2015-01-20 ENCOUNTER — Telehealth: Payer: Self-pay | Admitting: Cardiovascular Disease

## 2015-01-20 NOTE — Telephone Encounter (Signed)
SPOKE WITH PT   FOR  ABOUT  30 MIN  SEEMED VERY  ANXIOUS HAD  A LOT OF  COMPLAINTS   STARTING WITH B/P RUNNING  HIGH   AND THEN  COMPLAINED WITH  4 EPISODES  OF  DIZZINESS B/P  IN AM IS RUNNING  161-148/ 60-93   AFTER  HAVING HAD  TAKEN  LOSARTAN  2 HOURS  PRIOR . HAS  NOT  TAKEN ANY AMLODIPINE  WILL TRY AND  TAKE  THIS  MED  MID  DAY .  ALSO ENCOURAGE PT   TO TRY AND  HAVE SOMEONE  CHECK B/P WHEN  HAS   DIZZY SPELL NOT SURE   THIS  IS  HEART  RELATED  ALSO  COMPLAINING  WITH  NECK  STIFFNESS WAS RECENTLY GIVEN   MUSCLE RELAXER ALSO ZOLOFT  WAS  STARTED  AND  RECENTLY INCREASED   DUE  TO ANXIETY   CONTINUES TO  SOUND  ANXIOUS  WILL FORWARD  MESSAGE TO DR Johnsie Cancel  FOR  REVIEW .HAS  APPT  ON 02-10-15  AT  8:00 AM .Adonis Housekeeper

## 2015-01-20 NOTE — Telephone Encounter (Signed)
New message     Pt c/o BP issue: STAT if pt c/o blurred vision, one-sided weakness or slurred speech  1. What are your last 5 BP readings? 148/83 this morning; 159/89 yesterday 6am; 128/74 yesterday 7:30pm; 117/63 yesterday @ 9pm  2. Are you having any other symptoms (ex. Dizziness, headache, blurred vision, passed out)? Dizziness; some headache at times;   3. What is your BP issue? Running higher than normal   Pt states b/p goes down during the day; Pt was taking losartan 50mg  but now taking 100mg  losartan

## 2015-01-20 NOTE — Telephone Encounter (Signed)
PT  NOTIFIED ./CY 

## 2015-01-20 NOTE — Telephone Encounter (Signed)
F/u with primary these are not cardiac issues

## 2015-01-31 DIAGNOSIS — T63441D Toxic effect of venom of bees, accidental (unintentional), subsequent encounter: Secondary | ICD-10-CM | POA: Diagnosis not present

## 2015-02-03 DIAGNOSIS — T63441D Toxic effect of venom of bees, accidental (unintentional), subsequent encounter: Secondary | ICD-10-CM | POA: Diagnosis not present

## 2015-02-08 ENCOUNTER — Encounter: Payer: Self-pay | Admitting: Cardiovascular Disease

## 2015-02-08 NOTE — Progress Notes (Signed)
Patient ID: Alex Castillo, male   DOB: Jan 09, 1946, 69 y.o.   MRN: [id number] Alex Castillo returns for followup of his hypertension and palpitations and first degree AV block  (218) . He doing well. Palpitations resolved. He is active playing tennis and doing elliptical training. His blood pressure seems to be under reasonable control  Diet is somewhat poor. He can do a better job with sodium and carbohydrates. He has been compliant with his medications. Is not having significant headache palpitations PND or orthopnea no chest pain no evidence of vascular disease claudication TIA or CVA.  Hates diuretic Makes him feel weak, poor sex drive and "arthritis" worse. This was stopped last year as a result. On ARB  Home BP readings excellent Some neck and lower back pains Working too much as Manufacturing systems engineer   Had basal cell on nose healing slowly  Worsening anxiety issues  Lots of somatic complaints about dizzyness, ? Vertigo and cervical spine issues  ROS: Denies fever, malais, weight loss, blurry vision, decreased visual acuity, cough, sputum, SOB, hemoptysis, pleuritic pain, palpitaitons, heartburn, abdominal pain, melena, lower extremity edema, claudication, or rash.  All other systems reviewed and negative  General: Affect appropriate Healthy:  appears stated age 24: S/P surgery basal cell right side of nose slow to heal Neck supple with no adenopathy JVP normal no bruits no thyromegaly Lungs clear with no wheezing and good diaphragmatic motion Heart:  S1/S2 no murmur, no rub, gallop or click PMI normal Abdomen: benighn, BS positve, no tenderness, no AAA no bruit.  No HSM or HJR Distal pulses intact with no bruits No edema Neuro non-focal Skin warm and dry No muscular weakness   Current Outpatient Prescriptions  Medication Sig Dispense Refill  . Albuterol Sulfate (PROVENTIL HFA IN) Inhale 1 puff into the lungs daily as needed (for wheezing/shortness of breath).     .  ALPRAZolam (XANAX) 0.25 MG tablet Take 0.25 mg by mouth at bedtime as needed for sleep.    [name] amLODipine (NORVASC) 5 MG tablet Take 1 tablet (5 mg total) by mouth daily. 30 tablet 1  . aspirin 81 MG tablet Take 81 mg by mouth daily.      . Cholecalciferol (VITAMIN D3) 5000 UNITS TABS Take 1 tablet by mouth daily.    [name] EPINEPHrine (EPI-PEN) 0.3 mg/0.3 mL SOAJ Inject 0.3 mg into the muscle once.    . fish oil-omega-3 fatty acids 1000 MG capsule Take 2 g by mouth daily. Shaklee Omega Heartguard    . Fluticasone Furoate 200 MCG/ACT AEPB Inhale 1 puff into the lungs daily as needed (for wheezing/shortness of breath).     . hydrochlorothiazide (HYDRODIURIL) 12.5 MG tablet Take 12.5 mg by mouth daily.    [name] losartan (COZAAR) 100 MG tablet TAKE ONE (1) TABLET BY MOUTH EVERY DAY 90 tablet 3  . Multiple Vitamins-Minerals (CENTRUM SILVER ADULT 50+ PO) Take 1 tablet by mouth daily.    [name] nystatin-triamcinolone ointment (MYCOLOG) APPLY TOPICALLY 2 TIMES DAILY FOR PERIANAL DERMATITIS 30 g 0  . pantoprazole (PROTONIX) 40 MG tablet Take 40 mg by mouth daily.    . ranitidine (ZANTAC) 150 MG capsule Take 150 mg by mouth daily as needed for heartburn.     . sertraline (ZOLOFT) 50 MG tablet Take 50 mg by mouth daily.    . tadalafil (CIALIS) 10 MG tablet Take 5 mg by mouth daily as needed for erectile dysfunction.     . Tamsulosin HCl (FLOMAX) 0.4 MG CAPS Take 0.4  mg by mouth daily.      [name] tiZANidine (ZANAFLEX) 4 MG tablet Take 4 mg by mouth daily as needed. For muscle pain    . Triamcinolone Acetonide (NASACORT ALLERGY 24HR NA) Place 2 sprays into the nose daily as needed (allergies).     . vitamin E 200 UNIT capsule Take 400 Units by mouth daily.      No current facility-administered medications for this visit.    Allergies  Bee venom and Hydromorphone hcl  Electrocardiogram:   11/07/13  SR rate 65 PR 218  Otherwise normal  09/09/14 SR rate 72  PR 214  Otherwise normal  Assessment and Plan Palpitations:   Improved no structural heart disease HTN:  Well controlled.  Continue current medications and low sodium Dash type diet.   Dizzy:  Sounds more like vertigo f/u primary consider ENT referral  First Degree:  Heart block ECG stable today PR 214  Yearly ECG   Jenkins Rouge

## 2015-02-10 ENCOUNTER — Ambulatory Visit (INDEPENDENT_AMBULATORY_CARE_PROVIDER_SITE_OTHER): Payer: Medicare Other | Admitting: Cardiovascular Disease

## 2015-02-10 ENCOUNTER — Encounter: Payer: Self-pay | Admitting: Cardiovascular Disease

## 2015-02-10 VITALS — BP 126/62 | HR 74 | Ht 70.0 in | Wt 219.8 lb

## 2015-02-10 DIAGNOSIS — I10 Essential (primary) hypertension: Secondary | ICD-10-CM

## 2015-02-10 DIAGNOSIS — I440 Atrioventricular block, first degree: Secondary | ICD-10-CM | POA: Diagnosis not present

## 2015-02-10 NOTE — Patient Instructions (Addendum)
Medication Instructions:  Your physician recommends that you continue on your current medications as directed. Please refer to the Current Medication list given to you today.   Labwork: NONE Testing/Procedures: NONE  Follow-Up: Your physician wants you to follow-up in: YEAR WITH  DR NISHAN  You will receive a reminder letter in the mail two months in advance. If you don't receive a letter, please call our office to schedule the follow-up appointment.  Any Other Special Instructions Will Be Listed Below (If Applicable).   

## 2015-03-22 ENCOUNTER — Ambulatory Visit: Payer: Medicare Other | Admitting: Cardiovascular Disease

## 2015-03-31 ENCOUNTER — Ambulatory Visit (INDEPENDENT_AMBULATORY_CARE_PROVIDER_SITE_OTHER): Payer: Medicare Other

## 2015-03-31 DIAGNOSIS — L570 Actinic keratosis: Secondary | ICD-10-CM | POA: Diagnosis not present

## 2015-03-31 DIAGNOSIS — Z91038 Other insect allergy status: Secondary | ICD-10-CM | POA: Diagnosis not present

## 2015-03-31 DIAGNOSIS — Z91030 Bee allergy status: Secondary | ICD-10-CM

## 2015-04-19 DIAGNOSIS — F411 Generalized anxiety disorder: Secondary | ICD-10-CM | POA: Diagnosis not present

## 2015-04-19 DIAGNOSIS — I10 Essential (primary) hypertension: Secondary | ICD-10-CM | POA: Diagnosis not present

## 2015-04-19 DIAGNOSIS — M542 Cervicalgia: Secondary | ICD-10-CM | POA: Diagnosis not present

## 2015-04-19 DIAGNOSIS — Z23 Encounter for immunization: Secondary | ICD-10-CM | POA: Diagnosis not present

## 2015-05-26 ENCOUNTER — Ambulatory Visit (INDEPENDENT_AMBULATORY_CARE_PROVIDER_SITE_OTHER): Payer: Medicare Other | Admitting: *Deleted

## 2015-05-26 DIAGNOSIS — T63441D Toxic effect of venom of bees, accidental (unintentional), subsequent encounter: Secondary | ICD-10-CM

## 2015-05-26 DIAGNOSIS — IMO0001 Reserved for inherently not codable concepts without codable children: Secondary | ICD-10-CM

## 2015-07-14 NOTE — Progress Notes (Signed)
This encounter was created in error - please disregard.

## 2015-07-21 ENCOUNTER — Ambulatory Visit (INDEPENDENT_AMBULATORY_CARE_PROVIDER_SITE_OTHER): Payer: Medicare Other

## 2015-07-21 DIAGNOSIS — IMO0001 Reserved for inherently not codable concepts without codable children: Secondary | ICD-10-CM

## 2015-07-21 DIAGNOSIS — T63441D Toxic effect of venom of bees, accidental (unintentional), subsequent encounter: Secondary | ICD-10-CM

## 2015-09-06 ENCOUNTER — Other Ambulatory Visit: Payer: Self-pay | Admitting: Nurse Practitioner

## 2015-09-15 ENCOUNTER — Ambulatory Visit (INDEPENDENT_AMBULATORY_CARE_PROVIDER_SITE_OTHER): Payer: Medicare Other | Admitting: *Deleted

## 2015-09-15 DIAGNOSIS — IMO0001 Reserved for inherently not codable concepts without codable children: Secondary | ICD-10-CM

## 2015-09-15 DIAGNOSIS — T63441D Toxic effect of venom of bees, accidental (unintentional), subsequent encounter: Secondary | ICD-10-CM

## 2015-09-29 DIAGNOSIS — L821 Other seborrheic keratosis: Secondary | ICD-10-CM | POA: Diagnosis not present

## 2015-09-29 DIAGNOSIS — L570 Actinic keratosis: Secondary | ICD-10-CM | POA: Diagnosis not present

## 2015-10-14 ENCOUNTER — Ambulatory Visit: Payer: Medicare Other | Admitting: Internal Medicine

## 2015-11-03 DIAGNOSIS — Z125 Encounter for screening for malignant neoplasm of prostate: Secondary | ICD-10-CM | POA: Diagnosis not present

## 2015-11-03 DIAGNOSIS — R350 Frequency of micturition: Secondary | ICD-10-CM | POA: Diagnosis not present

## 2015-11-03 DIAGNOSIS — N200 Calculus of kidney: Secondary | ICD-10-CM | POA: Diagnosis not present

## 2015-11-03 DIAGNOSIS — N401 Enlarged prostate with lower urinary tract symptoms: Secondary | ICD-10-CM | POA: Diagnosis not present

## 2015-11-03 DIAGNOSIS — N5201 Erectile dysfunction due to arterial insufficiency: Secondary | ICD-10-CM | POA: Diagnosis not present

## 2015-11-09 DIAGNOSIS — I10 Essential (primary) hypertension: Secondary | ICD-10-CM | POA: Diagnosis not present

## 2015-11-09 DIAGNOSIS — F411 Generalized anxiety disorder: Secondary | ICD-10-CM | POA: Diagnosis not present

## 2015-11-09 DIAGNOSIS — J4520 Mild intermittent asthma, uncomplicated: Secondary | ICD-10-CM | POA: Diagnosis not present

## 2015-11-09 DIAGNOSIS — K219 Gastro-esophageal reflux disease without esophagitis: Secondary | ICD-10-CM | POA: Diagnosis not present

## 2015-11-09 DIAGNOSIS — N400 Enlarged prostate without lower urinary tract symptoms: Secondary | ICD-10-CM | POA: Diagnosis not present

## 2015-11-09 DIAGNOSIS — Z1389 Encounter for screening for other disorder: Secondary | ICD-10-CM | POA: Diagnosis not present

## 2015-11-09 DIAGNOSIS — J302 Other seasonal allergic rhinitis: Secondary | ICD-10-CM | POA: Diagnosis not present

## 2015-11-09 DIAGNOSIS — M5030 Other cervical disc degeneration, unspecified cervical region: Secondary | ICD-10-CM | POA: Diagnosis not present

## 2015-11-11 ENCOUNTER — Ambulatory Visit (INDEPENDENT_AMBULATORY_CARE_PROVIDER_SITE_OTHER): Payer: Medicare Other | Admitting: *Deleted

## 2015-11-11 DIAGNOSIS — T63441D Toxic effect of venom of bees, accidental (unintentional), subsequent encounter: Secondary | ICD-10-CM | POA: Diagnosis not present

## 2015-11-11 DIAGNOSIS — IMO0001 Reserved for inherently not codable concepts without codable children: Secondary | ICD-10-CM

## 2015-12-27 ENCOUNTER — Ambulatory Visit: Payer: Medicare Other | Admitting: Internal Medicine

## 2015-12-28 DIAGNOSIS — R7309 Other abnormal glucose: Secondary | ICD-10-CM | POA: Diagnosis not present

## 2016-01-05 ENCOUNTER — Ambulatory Visit (INDEPENDENT_AMBULATORY_CARE_PROVIDER_SITE_OTHER): Payer: Medicare Other | Admitting: *Deleted

## 2016-01-05 DIAGNOSIS — T63441D Toxic effect of venom of bees, accidental (unintentional), subsequent encounter: Secondary | ICD-10-CM

## 2016-01-05 DIAGNOSIS — IMO0001 Reserved for inherently not codable concepts without codable children: Secondary | ICD-10-CM

## 2016-01-13 ENCOUNTER — Other Ambulatory Visit: Payer: Self-pay

## 2016-02-17 ENCOUNTER — Ambulatory Visit: Payer: Medicare Other | Admitting: Internal Medicine

## 2016-02-23 DIAGNOSIS — Z23 Encounter for immunization: Secondary | ICD-10-CM | POA: Diagnosis not present

## 2016-02-29 DIAGNOSIS — T63441D Toxic effect of venom of bees, accidental (unintentional), subsequent encounter: Secondary | ICD-10-CM | POA: Diagnosis not present

## 2016-03-01 ENCOUNTER — Ambulatory Visit (INDEPENDENT_AMBULATORY_CARE_PROVIDER_SITE_OTHER): Payer: Medicare Other | Admitting: *Deleted

## 2016-03-01 DIAGNOSIS — T63441D Toxic effect of venom of bees, accidental (unintentional), subsequent encounter: Secondary | ICD-10-CM | POA: Diagnosis not present

## 2016-03-01 DIAGNOSIS — IMO0001 Reserved for inherently not codable concepts without codable children: Secondary | ICD-10-CM

## 2016-03-02 ENCOUNTER — Other Ambulatory Visit: Payer: Self-pay | Admitting: Internal Medicine

## 2016-03-12 DIAGNOSIS — S300XXA Contusion of lower back and pelvis, initial encounter: Secondary | ICD-10-CM | POA: Diagnosis not present

## 2016-03-12 DIAGNOSIS — S51011A Laceration without foreign body of right elbow, initial encounter: Secondary | ICD-10-CM | POA: Diagnosis not present

## 2016-03-12 DIAGNOSIS — S40011A Contusion of right shoulder, initial encounter: Secondary | ICD-10-CM | POA: Diagnosis not present

## 2016-03-12 DIAGNOSIS — Z23 Encounter for immunization: Secondary | ICD-10-CM | POA: Diagnosis not present

## 2016-03-12 DIAGNOSIS — I10 Essential (primary) hypertension: Secondary | ICD-10-CM | POA: Diagnosis not present

## 2016-03-12 DIAGNOSIS — Z79899 Other long term (current) drug therapy: Secondary | ICD-10-CM | POA: Diagnosis not present

## 2016-03-13 DIAGNOSIS — S40011A Contusion of right shoulder, initial encounter: Secondary | ICD-10-CM | POA: Diagnosis not present

## 2016-03-13 DIAGNOSIS — S51011A Laceration without foreign body of right elbow, initial encounter: Secondary | ICD-10-CM | POA: Diagnosis not present

## 2016-03-13 DIAGNOSIS — S300XXA Contusion of lower back and pelvis, initial encounter: Secondary | ICD-10-CM | POA: Diagnosis not present

## 2016-03-15 DIAGNOSIS — M25511 Pain in right shoulder: Secondary | ICD-10-CM | POA: Diagnosis not present

## 2016-03-15 DIAGNOSIS — M5010 Cervical disc disorder with radiculopathy, unspecified cervical region: Secondary | ICD-10-CM | POA: Diagnosis not present

## 2016-03-15 DIAGNOSIS — S7001XA Contusion of right hip, initial encounter: Secondary | ICD-10-CM | POA: Diagnosis not present

## 2016-03-18 DIAGNOSIS — J209 Acute bronchitis, unspecified: Secondary | ICD-10-CM | POA: Diagnosis not present

## 2016-03-18 DIAGNOSIS — J069 Acute upper respiratory infection, unspecified: Secondary | ICD-10-CM | POA: Diagnosis not present

## 2016-03-27 DIAGNOSIS — M25511 Pain in right shoulder: Secondary | ICD-10-CM | POA: Diagnosis not present

## 2016-03-27 DIAGNOSIS — R42 Dizziness and giddiness: Secondary | ICD-10-CM | POA: Diagnosis not present

## 2016-03-27 DIAGNOSIS — H6123 Impacted cerumen, bilateral: Secondary | ICD-10-CM | POA: Diagnosis not present

## 2016-03-29 DIAGNOSIS — M25521 Pain in right elbow: Secondary | ICD-10-CM | POA: Diagnosis not present

## 2016-03-29 DIAGNOSIS — M7021 Olecranon bursitis, right elbow: Secondary | ICD-10-CM | POA: Diagnosis not present

## 2016-03-31 DIAGNOSIS — M7021 Olecranon bursitis, right elbow: Secondary | ICD-10-CM | POA: Diagnosis not present

## 2016-04-03 DIAGNOSIS — M7021 Olecranon bursitis, right elbow: Secondary | ICD-10-CM | POA: Diagnosis not present

## 2016-04-10 DIAGNOSIS — M7021 Olecranon bursitis, right elbow: Secondary | ICD-10-CM | POA: Diagnosis not present

## 2016-04-19 DIAGNOSIS — M7021 Olecranon bursitis, right elbow: Secondary | ICD-10-CM | POA: Diagnosis not present

## 2016-04-23 ENCOUNTER — Encounter (INDEPENDENT_AMBULATORY_CARE_PROVIDER_SITE_OTHER): Payer: Self-pay

## 2016-04-23 ENCOUNTER — Encounter: Payer: Self-pay | Admitting: Internal Medicine

## 2016-04-23 ENCOUNTER — Ambulatory Visit (INDEPENDENT_AMBULATORY_CARE_PROVIDER_SITE_OTHER): Payer: Medicare Other | Admitting: Internal Medicine

## 2016-04-23 VITALS — BP 140/84 | HR 76 | Ht 70.0 in | Wt 233.0 lb

## 2016-04-23 DIAGNOSIS — F419 Anxiety disorder, unspecified: Secondary | ICD-10-CM | POA: Diagnosis not present

## 2016-04-23 DIAGNOSIS — K219 Gastro-esophageal reflux disease without esophagitis: Secondary | ICD-10-CM

## 2016-04-23 DIAGNOSIS — R29898 Other symptoms and signs involving the musculoskeletal system: Secondary | ICD-10-CM | POA: Diagnosis not present

## 2016-04-23 MED ORDER — ALPRAZOLAM 0.25 MG PO TABS: 0.2500 mg | ORAL_TABLET | Freq: Every evening | ORAL | 5 refills | Status: DC | PRN

## 2016-04-23 NOTE — Progress Notes (Signed)
Alto Mesmer 70 y.o. Mar 01, 1946 [id number]  Assessment & Plan:   Encounter Diagnoses  Name Primary?  . Gastroesophageal reflux disease, esophagitis presence not specified Yes  . Anxiety   . Neck tightness     Continue PPI - currently taking Aciphex - reassured I did not think that would affect BP/BP meds  Refill low-dose alprazolm for anxiety - uses prn to prevent neck tightness and to allow sleep. Has been on this for decades started by earlier GI MD and has always been responsible - can understand that would prefer different tx if possible but chronicity of use makes me think best course is to continue so I refilled  I appreciate the opportunity to care for this patient. Cc:VAN EYK, Corene Cornea, MD     Subjective:   Chief Complaint: heartburn  HPI Deshun is here for follow-up today, last seen in 2015. He has long-standing GERD symptoms and functional esophageal and GI symptoms as well. He is currently using AcipHex 20 mg daily with fairly good control except for occasional heartburn. He has some concern that that could be interfering with his blood pressure medication, and he avoids Zegerid because of the sodium bicarbonate and his sodium restriction though Zegerid is the best PPI for him as far his symptoms.  He still uses alprazolam 0.25 mg or half of that when necessary usually at bedtime to prevent or treat neck tightness or stiffness. It was started many years ago, I think by Dr. Velora Heckler, and I have refilled through the years and there've been no issues with it. He says his primary care provider doesn't like that medication. Takoma says that over the years it's always worked well and he hasn't had particular side effects or problems.  Allergies  Allergen Reactions  . Bee Venom Anaphylaxis  . Hydromorphone Hcl Other (See Comments)    Reaction unknown   Outpatient Medications Prior to Visit  Medication Sig Dispense Refill  . Albuterol Sulfate (PROVENTIL HFA IN) Inhale 1  puff into the lungs daily as needed (for wheezing/shortness of breath).     [name] amLODipine (NORVASC) 5 MG tablet TAKE ONE (1) TABLET ONCE DAILY (Patient taking differently: TAKE ONE (1) TABLET ONCE DAILY as needed) 30 tablet 6  . aspirin 81 MG tablet Take 81 mg by mouth daily.      . Cholecalciferol (VITAMIN D3) 5000 UNITS TABS Take 1 tablet by mouth daily.    [name] EPINEPHrine (EPI-PEN) 0.3 mg/0.3 mL SOAJ Inject 0.3 mg into the muscle once.    . fish oil-omega-3 fatty acids 1000 MG capsule Take 2 g by mouth daily. Shaklee Omega Heartguard    . hydrochlorothiazide (HYDRODIURIL) 12.5 MG tablet Take 12.5 mg by mouth daily.    [name] losartan (COZAAR) 100 MG tablet TAKE ONE (1) TABLET BY MOUTH EVERY DAY 90 tablet 3  . Multiple Vitamins-Minerals (CENTRUM SILVER ADULT 50+ PO) Take 1 tablet by mouth daily.    [name] nystatin-triamcinolone ointment (MYCOLOG) APPLY 2 TIMES DAILY FOR PERIANAL DERMATITIS 30 g 0  . ranitidine (ZANTAC) 150 MG capsule Take 150 mg by mouth daily as needed for heartburn.     . sertraline (ZOLOFT) 50 MG tablet Take 50 mg by mouth daily.    . tadalafil (CIALIS) 10 MG tablet Take 5 mg by mouth daily as needed for erectile dysfunction.     . Tamsulosin HCl (FLOMAX) 0.4 MG CAPS Take 0.4 mg by mouth daily.      [name] tiZANidine (ZANAFLEX) 4 MG tablet Take 4  mg by mouth daily as needed. For muscle pain    . Triamcinolone Acetonide (NASACORT ALLERGY 24HR NA) Place 2 sprays into the nose daily as needed (allergies).     . vitamin E 200 UNIT capsule Take 400 Units by mouth daily.     [name] ALPRAZolam (XANAX) 0.25 MG tablet Take 0.25 mg by mouth at bedtime as needed for sleep.    . Rabeprazole  Take 20 mg by mouth daily.    . Fluticasone Furoate 200 MCG/ACT AEPB Inhale 1 puff into the lungs daily as needed (for wheezing/shortness of breath).       Past Medical History:  Diagnosis Date  . Adenomatous polyps   . Anxiety   . BPH (benign prostatic hyperplasia)   . Diverticulosis   . ED (erectile dysfunction)    . Esophageal stricture   . First degree AV block   . GERD (gastroesophageal reflux disease)   . Hiatal hernia   . History of kidney stones   . HTN (hypertension)    ETT negative 10/08  . IBS (irritable bowel syndrome)   . Internal hemorrhoids   . Palpitations   . Unspecified gastritis and gastroduodenitis without mention of hemorrhage 02/19/2013   Past Surgical History:  Procedure Laterality Date  . bilateral inguinal hernia repair    . COLONOSCOPY W/ BIOPSIES AND POLYPECTOMY  09/15/2008   adonomatous polyps, diverticulosis, internal hemorrhoids  . CYSTOSCOPY/RETROGRADE/URETEROSCOPY/STONE EXTRACTION WITH BASKET Left 12/08/2012   Procedure: CYSTOSCOPY, LEFT RETROGRADE, LEFT URETEROSCOPY/STONE EXTRACTION WITH BASKET;  Surgeon: Alexis Frock, MD;  Location: [facility];  Service: Urology;  Laterality: Left;  . ESOPHAGOGASTRODUODENOSCOPY  02/19/2013  . lower back surgery with lumbar vertebral repair    . nasl septoplasty    . right foot surgery    . UPPER GASTROINTESTINAL ENDOSCOPY  02/07/2010   w/dil, tourtuous esophagus, hiatal hernia   Social History   Social History  . Marital status: Married    Spouse name: N/A  . Number of children: 2  . Years of education: N/A   Occupational History  . Retired    Social History Main Topics  . Smoking status: Never Smoker  . Smokeless tobacco: Never Used  . Alcohol use No  . Drug use: No  . Sexual activity: Not Asked   Other Topics Concern  . None   Social History Narrative   Daily Caffeine use sodas and tea   Family History  Problem Relation Age of Onset  . Lung cancer Father   . Heart disease Father   . Arthritis Father   . Hypertension Mother   . Asthma Maternal Grandfather   . Liver disease Daughter     Primary Sclerosisng cholangitis  . Colon cancer Neg Hx   . Throat cancer Neg Hx   . Kidney disease Neg Hx        Review of Systems He has had intermittent short-lived spells of vertigo over the past  year.  Objective:   Physical Exam @BP  140/84   Pulse 76   Ht 5\' 10"  (1.778 m)   Wt 233 lb (105.7 kg)   SpO2 95%   BMI 33.43 kg/m @  General:  NAD Eyes:   anicteric Lungs:  clear Heart::  S1S2 no rubs, murmurs or gallops Abdomen:  soft and nontender, BS+ Ext:   no edema, cyanosis or clubbing    Data Reviewed:  Prior GI notes and endoscopy procedures last EGD 2014 with minimal esophageal inflammation on biopsies, colonoscopy 2015 with a benign mucosal  polyp nothing precancerous   15 minutes time spent with patient > half in counseling coordination of care

## 2016-04-23 NOTE — Patient Instructions (Signed)
   Today we are giving you a printed rx for xanax to fill.    Follow up with Dr Carlean Purl in a year.     I appreciate the opportunity to care for you. Silvano Rusk, MD, [facility]

## 2016-04-26 ENCOUNTER — Ambulatory Visit (INDEPENDENT_AMBULATORY_CARE_PROVIDER_SITE_OTHER): Payer: Medicare Other | Admitting: *Deleted

## 2016-04-26 DIAGNOSIS — IMO0001 Reserved for inherently not codable concepts without codable children: Secondary | ICD-10-CM

## 2016-04-26 DIAGNOSIS — T63441D Toxic effect of venom of bees, accidental (unintentional), subsequent encounter: Secondary | ICD-10-CM

## 2016-04-27 ENCOUNTER — Encounter: Payer: Self-pay | Admitting: Allergy and Immunology

## 2016-04-27 ENCOUNTER — Ambulatory Visit (INDEPENDENT_AMBULATORY_CARE_PROVIDER_SITE_OTHER): Payer: Medicare Other | Admitting: Allergy and Immunology

## 2016-04-27 VITALS — BP 156/94 | HR 68 | Resp 16 | Ht 67.87 in | Wt 232.8 lb

## 2016-04-27 DIAGNOSIS — Z91038 Other insect allergy status: Secondary | ICD-10-CM | POA: Diagnosis not present

## 2016-04-27 DIAGNOSIS — J3089 Other allergic rhinitis: Secondary | ICD-10-CM | POA: Diagnosis not present

## 2016-04-27 DIAGNOSIS — M6089 Other myositis, multiple sites: Secondary | ICD-10-CM | POA: Diagnosis not present

## 2016-04-27 DIAGNOSIS — J4520 Mild intermittent asthma, uncomplicated: Secondary | ICD-10-CM | POA: Diagnosis not present

## 2016-04-27 DIAGNOSIS — Z91030 Bee allergy status: Secondary | ICD-10-CM

## 2016-04-27 MED ORDER — ALBUTEROL SULFATE 108 (90 BASE) MCG/ACT IN AEPB: 2.0000 | INHALATION_SPRAY | RESPIRATORY_TRACT | 0 refills | Status: DC | PRN

## 2016-04-27 MED ORDER — MOMETASONE FURO-FORMOTEROL FUM 200-5 MCG/ACT IN AERO: INHALATION_SPRAY | RESPIRATORY_TRACT | 0 refills | Status: DC

## 2016-04-27 MED ORDER — ALBUTEROL SULFATE HFA 108 (90 BASE) MCG/ACT IN AERS: INHALATION_SPRAY | RESPIRATORY_TRACT | 1 refills | Status: DC

## 2016-04-27 MED ORDER — FLUTICASONE-SALMETEROL 100-50 MCG/DOSE IN AEPB: INHALATION_SPRAY | RESPIRATORY_TRACT | 11 refills | Status: DC

## 2016-04-27 MED ORDER — EPINEPHRINE 0.3 MG/0.3ML IJ SOAJ: INTRAMUSCULAR | 3 refills | Status: DC

## 2016-04-27 NOTE — Progress Notes (Signed)
Follow-up Note  Referring Provider: Townsend Roger, MD Primary Provider: Townsend Roger, MD Date of Office Visit: 04/27/2016  Subjective:   Alex Castillo (DOB: Sep 18, 1945) is a 70 y.o. 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 I've not seen him in his clinic since December 2015.  He is done quite well with his airway. He has not had an exacerbation of asthma requiring him to get a steroid and he rarely uses Proventil HFA and for the most part can exercise without any difficulty. He does not use his Advair except occasionally during the spring and fall and especially whenever he develops a head cold which usually migrates down into his chest.  His nose has been doing quite well while using some over-the-counter Nasacort. He is not had an episode of sinusitis requiring him to get an antibiotic.  He continues with immunotherapy directed against Hymenoptera venom. Currently he is receiving immunotherapy every 6 weeks. He has not had an adverse response secondary to that immunotherapy. He has not had a field sting. He does have an EpiPen.  Alex Castillo has been having an issue with his muscles. He states that over the course of the past several years he's had this progressive issue with shoulder and hip and thigh and arm pain which is not in his joints but in his muscles. If he sits down for any length of time upon arising he develops very significant aching of his muscles. He has not really had any other associated systemic or constitutional symptoms with this issue. He does not think that he is particularly weak but he is just painful. He is not on any new medications that may have  made this issue, but he did start hydrochlorothiazide about 2 years ago and may be the issue with his muscles is a little bit worse as a result of that medication. He's also noticed that he's had some fasciculations of his muscles. This is very intermittent and does not appear to be a progressive issue.  In addition, he's had some problems with intermittent vertigo. He will develop intermittent episodes of vertigo that last about a minute or so. These are relatively debilitating when they do occur as he can't really function that well. He's been given some meclizine in the past which he rarely uses because his reactions are usually over a minute or so. He has not had any these reactions over the course the past 3 months. There is no associated systemic or constitutional symptoms with this issue. He does not have tinnitus and he does not have hearing loss.    Medication List      ALPRAZolam 0.25 MG tablet Commonly known as:  XANAX Take 1 tablet (0.25 mg total) by mouth at bedtime as needed for sleep.   amLODipine 5 MG tablet Commonly known as:  NORVASC TAKE ONE (1) TABLET ONCE DAILY   aspirin 81 MG tablet Take 81 mg by mouth daily.   CELEBREX 200 MG capsule Generic drug:  celecoxib Take 200 mg by mouth daily.   EPINEPHrine 0.3 mg/0.3 mL Soaj injection Commonly known as:  EPI-PEN Inject 0.3 mg into the muscle once.   fish oil-omega-3 fatty acids 1000 MG  capsule Take 2 g by mouth daily. Shaklee Omega Heartguard   hydrochlorothiazide 12.5 MG tablet Commonly known as:  HYDRODIURIL Take 12.5 mg by mouth daily.   losartan 100 MG tablet Commonly known as:  COZAAR TAKE ONE (1) TABLET BY MOUTH EVERY DAY   NASACORT ALLERGY 24HR NA Place 2 sprays into the nose daily as needed (allergies).   nystatin-triamcinolone ointment Commonly known as:  MYCOLOG APPLY 2 TIMES DAILY FOR PERIANAL DERMATITIS   OCUVITE PO Take by mouth.   RABEprazole 20 MG tablet Commonly known as:  ACIPHEX Take  1 tablet (20 mg total) by mouth daily.   sertraline 25 MG tablet Commonly known as:  ZOLOFT Take 25 mg by mouth daily.   tadalafil 10 MG tablet Commonly known as:  CIALIS Take 5 mg by mouth daily as needed for erectile dysfunction.   tamsulosin 0.4 MG Caps capsule Commonly known as:  FLOMAX Take 0.4 mg by mouth daily.   tiZANidine 4 MG tablet Commonly known as:  ZANAFLEX Take 4 mg by mouth daily as needed. For muscle pain   vitamin E 200 UNIT capsule Take 400 Units by mouth daily.       Past Medical History:  Diagnosis Date  . Adenomatous polyps   . Anxiety   . Asthma   . BPH (benign prostatic hyperplasia)   . Diverticulosis   . ED (erectile dysfunction)   . Esophageal stricture   . First degree AV block   . GERD (gastroesophageal reflux disease)   . Hiatal hernia   . History of kidney stones   . HTN (hypertension)    ETT negative 10/08  . Hymenoptera allergy   . IBS (irritable bowel syndrome)   . Internal hemorrhoids   . Palpitations   . Unspecified gastritis and gastroduodenitis without mention of hemorrhage 02/19/2013    Past Surgical History:  Procedure Laterality Date  . bilateral inguinal hernia repair    . COLONOSCOPY W/ BIOPSIES AND POLYPECTOMY  09/15/2008   adonomatous polyps, diverticulosis, internal hemorrhoids  . CYSTOSCOPY/RETROGRADE/URETEROSCOPY/STONE EXTRACTION WITH BASKET Left 12/08/2012   Procedure: CYSTOSCOPY, LEFT RETROGRADE, LEFT URETEROSCOPY/STONE EXTRACTION WITH BASKET;  Surgeon: Alexis Frock, MD;  Location: [facility];  Service: Urology;  Laterality: Left;  . ESOPHAGOGASTRODUODENOSCOPY  02/19/2013  . lower back surgery with lumbar vertebral repair    . nasl septoplasty    . right foot surgery    . UPPER GASTROINTESTINAL ENDOSCOPY  02/07/2010   w/dil, tourtuous esophagus, hiatal hernia    Allergies  Allergen Reactions  . Bee Venom Anaphylaxis  . Hydromorphone Hcl Other (See Comments)    Reaction unknown    Review of systems negative  except as noted in HPI / PMHx or noted below:  Review of Systems  Constitutional: Negative.   HENT: Negative.   Eyes: Negative.   Respiratory: Negative.   Cardiovascular: Negative.   Gastrointestinal: Negative.   Genitourinary: Negative.   Musculoskeletal: Negative.   Skin: Negative.   Neurological: Negative.   Endo/Heme/Allergies: Negative.   Psychiatric/Behavioral: Negative.      Objective:   Vitals:   04/27/16 1041  BP: (!) 156/94  Pulse: 68  Resp: 16   Height: 5' 7.87" (172.4 cm)  Weight: 232 lb 12.8 oz (105.6 kg)   Physical Exam  Constitutional: He is well-developed, well-nourished, and in no distress.  HENT:  Head: Normocephalic.  Right Ear: Tympanic membrane, external ear and ear canal normal.  Left Ear: Tympanic membrane, external ear and ear canal normal.  Nose: Nose normal.  No mucosal edema or rhinorrhea.  Mouth/Throat: Uvula is midline, oropharynx is clear and moist and mucous membranes are normal. No oropharyngeal exudate.  Eyes: Conjunctivae are normal.  Neck: Trachea normal. No tracheal tenderness present. No tracheal deviation present. No thyromegaly present.  Cardiovascular: Normal rate, regular rhythm, S1 normal, S2 normal and normal heart sounds.   No murmur heard. Pulmonary/Chest: Breath sounds normal. No stridor. No respiratory distress. He has no wheezes. He has no rales.  Musculoskeletal: He exhibits no edema.  Lymphadenopathy:       Head (right side): No tonsillar adenopathy present.       Head (left side): No tonsillar adenopathy present.    He has no cervical adenopathy.  Neurological: He is alert. Gait normal.  Skin: No rash noted. He is not diaphoretic. No erythema. Nails show no clubbing.  Psychiatric: Mood and affect normal.    Diagnostics:    Spirometry was performed and demonstrated an FEV1 of 2.12 at 69 % of predicted.  Assessment and Plan:   1. Other myositis of multiple sites   2. Asthma, mild intermittent, well-controlled     3. Other allergic rhinitis   4. Hymenoptera allergy     1. Advair 100 one inhalation twice a day during periods of asthma  2. Nasacort OTC 1-2 sprays each nostril one time per day  3. If needed:   A. Proventil HFA 2 puffs every 4-6 hours  B. OTC antihistamine  C. OTC nasal saline  4. Continue immunotherapy and EpiPen  5. Blood - CBC w/diff, ANA w/reflex, SED, CRP, CPK, Aldolase.  6. Further evaluation?  7. Return to clinic in 1 year or earlier if problem  Toney's airway issue appears to be under pretty good control at this point in time. He has had long-standing asthma and understand how to use medications in his appropriate manner and he knows when to activate a plan for treating inflammation. He has done very well on his plan of intermittent use of Advair and we'll continue to have him use this plan. He can remain on Nasacort for his upper airway. He can continue on immunotherapy for his hymenoptera venom hypersensitivity state. He very well could have myositis and we will investigate this issue with the blood tests noted above. His fasciculations are always worrisome for a possible motor neuron disease but in most cases motor neuron diseases do not present with muscle pain and that is really his overwhelming complaint. He could have a form of polymyalgia rheumatica. We'll make a determination about how to proceed regarding this issue once they have the results of all his blood tests returned.  Allena Katz, MD [facility]

## 2016-04-27 NOTE — Patient Instructions (Addendum)
  1. Advair 100 one inhalation twice a day during periods of asthma  2. Nasacort OTC 1-2 sprays each nostril one time per day  3. If needed:   A. Proventil HFA 2 puffs every 4-6 hours  B. OTC antihistamine  C. OTC nasal saline  4. Continue immunotherapy and EpiPen  5. Blood - CBC w/diff, ANA w/reflex, SED, CRP, CPK, Aldolase.  6. Further evaluation?  7. Return to clinic in 1 year or earlier if problem

## 2016-05-01 LAB — CK: Total CK: 118 U/L (ref 24–204)

## 2016-05-01 LAB — CBC WITH DIFFERENTIAL/PLATELET
Basophils Absolute: 0 10*3/uL (ref 0.0–0.2)
Basos: 1 %
EOS (ABSOLUTE): 0.1 10*3/uL (ref 0.0–0.4)
Eos: 2 %
Hematocrit: 42.7 % (ref 37.5–51.0)
Hemoglobin: 14.7 g/dL (ref 13.0–17.7)
Immature Grans (Abs): 0 10*3/uL (ref 0.0–0.1)
Immature Granulocytes: 0 %
Lymphocytes Absolute: 2.2 10*3/uL (ref 0.7–3.1)
Lymphs: 30 %
MCH: 30.8 pg (ref 26.6–33.0)
MCHC: 34.4 g/dL (ref 31.5–35.7)
MCV: 89 fL (ref 79–97)
Monocytes Absolute: 0.6 10*3/uL (ref 0.1–0.9)
Monocytes: 9 %
Neutrophils Absolute: 4.2 10*3/uL (ref 1.4–7.0)
Neutrophils: 58 %
Platelets: 205 10*3/uL (ref 150–379)
RBC: 4.78 x10E6/uL (ref 4.14–5.80)
RDW: 13.1 % (ref 12.3–15.4)
WBC: 7.2 10*3/uL (ref 3.4–10.8)

## 2016-05-01 LAB — SEDIMENTATION RATE: Sed Rate: 5 mm/hr (ref 0–30)

## 2016-05-01 LAB — C-REACTIVE PROTEIN: CRP: 1.5 mg/L (ref 0.0–4.9)

## 2016-05-01 LAB — ALDOLASE: Aldolase: 6.4 U/L (ref 3.3–10.3)

## 2016-05-01 LAB — ANA W/REFLEX IF POSITIVE: Anti Nuclear Antibody(ANA): NEGATIVE

## 2016-06-01 ENCOUNTER — Telehealth: Payer: Self-pay | Admitting: Cardiovascular Disease

## 2016-06-01 NOTE — Telephone Encounter (Signed)
Mr. Giannino is calling because he scheduled for dental work 06-04-16.Dr. Jeralene Huff at the dentist advised the patient to find out if he is still supposed to continue the amoxicillin, please call. Thanks

## 2016-06-01 NOTE — Telephone Encounter (Signed)
Left  Message for patient to call back

## 2016-06-01 NOTE — Telephone Encounter (Signed)
Patient wanted to know if he needed antibiotics for his dental cleanings and procedures. Informed patient that usually only antibiotics are ordered for patients who have had valve replacements. Patient stated he use to get antibiotics before his dental procedures a long time ago. Informed patient that antibiotics are not given as often as they use to be, and that since the over use of antibiotics a lot of bacteria has become resistant against antibiotics. Patient wanted Dr. Kyla Balzarine recommendation, will forward to him for further advisement.

## 2016-06-01 NOTE — Telephone Encounter (Signed)
Patient is returning  call,thanks. °

## 2016-06-03 NOTE — Telephone Encounter (Signed)
Does not need antibiotics as you indicated

## 2016-06-04 NOTE — Progress Notes (Deleted)
Patient ID: Alex Castillo, male   DOB: Jun 21, 1945, 71 y.o.   MRN: [id number] Ranferi returns for followup of his hypertension and palpitations and first degree AV block  (218) . He doing well. Palpitations resolved. He is active playing tennis and doing elliptical training. His blood pressure seems to be under reasonable control  Diet is somewhat poor. He can do a better job with sodium and carbohydrates. He has been compliant with his medications. Is not having significant headache palpitations PND or orthopnea no chest pain no evidence of vascular disease claudication TIA or CVA.   Hates diuretic Makes him feel weak, poor sex drive and "arthritis" worse. This was stopped last year as a result. On ARB  Working too much as Energy manager company   Worsening anxiety issues  Lots of somatic complaints about dizzyness, ? Vertigo and cervical spine issues Se[facility] by [facility] for asthma/allergies and also complained of muscle pains and vertigo  ROS: Denies fever, malais, weight loss, blurry vision, decreased visual acuity, cough, sputum, SOB, hemoptysis, pleuritic pain, palpitaitons, heartburn, abdominal pain, melena, lower extremity edema, claudication, or rash.  All other systems reviewed and negative  General: Affect appropriate Healthy:  appears stated age 55: S/P surgery basal cell right side of nose slow to heal Neck supple with no adenopathy JVP normal no bruits no thyromegaly Lungs clear with no wheezing and good diaphragmatic motion Heart:  S1/S2 no murmur, no rub, gallop or click PMI normal Abdomen: benighn, BS positve, no tenderness, no AAA no bruit.  No HSM or HJR Distal pulses intact with no bruits No edema Neuro non-focal Skin warm and dry No muscular weakness   Current Outpatient Prescriptions  Medication Sig Dispense Refill  . albuterol (PROVENTIL HFA) 108 (90 Base) MCG/ACT inhaler Inhale two puffs every four to six hours as needed for cough or wheeze. 1 Inhaler  1  . ALPRAZolam (XANAX) 0.25 MG tablet Take 1 tablet (0.25 mg total) by mouth at bedtime as needed for sleep. 30 tablet 5  . amLODipine (NORVASC) 5 MG tablet TAKE ONE (1) TABLET ONCE DAILY (Patient taking differently: TAKE ONE (1) TABLET ONCE DAILY as needed) 30 tablet 6  . aspirin 81 MG tablet Take 81 mg by mouth daily.      . celecoxib (CELEBREX) 200 MG capsule Take 200 mg by mouth daily.    [name] EPINEPHrine (EPI-PEN) 0.3 mg/0.3 mL SOAJ Inject 0.3 mg into the muscle once.    [name] EPINEPHrine 0.3 mg/0.3 mL IJ SOAJ injection Use as directed for life-threatening allergic reaction. 2 Device 3  . fish oil-omega-3 fatty acids 1000 MG capsule Take 2 g by mouth daily. Shaklee Omega Heartguard    . Fluticasone-Salmeterol (ADVAIR DISKUS) 100-50 MCG/DOSE AEPB Inhale one dose twice a day as directed.  Rinse, gargle, and spit after use. 1 each 11  . hydrochlorothiazide (HYDRODIURIL) 12.5 MG tablet Take 12.5 mg by mouth daily.    [name] losartan (COZAAR) 100 MG tablet TAKE ONE (1) TABLET BY MOUTH EVERY DAY 90 tablet 3  . Multiple Vitamins-Minerals (OCUVITE PO) Take by mouth.    . nystatin-triamcinolone ointment (MYCOLOG) APPLY 2 TIMES DAILY FOR PERIANAL DERMATITIS 30 g 0  . RABEprazole (ACIPHEX) 20 MG tablet Take 1 tablet (20 mg total) by mouth daily. 90 tablet 3  . sertraline (ZOLOFT) 25 MG tablet Take 25 mg by mouth daily.    . tadalafil (CIALIS) 10 MG tablet Take 5 mg by mouth daily as needed for erectile dysfunction.     [name]  Tamsulosin HCl (FLOMAX) 0.4 MG CAPS Take 0.4 mg by mouth daily.      [name] tiZANidine (ZANAFLEX) 4 MG tablet Take 4 mg by mouth daily as needed. For muscle pain    . Triamcinolone Acetonide (NASACORT ALLERGY 24HR NA) Place 2 sprays into the nose daily as needed (allergies).     . vitamin E 200 UNIT capsule Take 400 Units by mouth daily.      No current facility-administered medications for this visit.     Allergies  Bee venom and Hydromorphone hcl  Electrocardiogram:   11/07/13  SR rate 65 PR  218  Otherwise normal  09/09/14 SR rate 72  PR 214  Otherwise normal  Assessment and Plan Palpitations:  Improved no structural heart disease HTN:  Well controlled.  Continue current medications and low sodium Dash type diet.   Dizzy:  Sounds more like vertigo f/u primary consider ENT referral  First Degree:  Heart block ECG stable today PR 214  Yearly ECG  Myalgias:    Jenkins Rouge

## 2016-06-04 NOTE — Telephone Encounter (Signed)
Left message for patient to call back  

## 2016-06-06 NOTE — Telephone Encounter (Signed)
Left message for patient to call back if he had any other questions.

## 2016-06-08 DIAGNOSIS — R1032 Left lower quadrant pain: Secondary | ICD-10-CM | POA: Diagnosis not present

## 2016-06-11 ENCOUNTER — Ambulatory Visit: Payer: Medicare Other | Admitting: Cardiovascular Disease

## 2016-06-14 DIAGNOSIS — L821 Other seborrheic keratosis: Secondary | ICD-10-CM | POA: Diagnosis not present

## 2016-06-14 DIAGNOSIS — L570 Actinic keratosis: Secondary | ICD-10-CM | POA: Diagnosis not present

## 2016-06-14 DIAGNOSIS — D1801 Hemangioma of skin and subcutaneous tissue: Secondary | ICD-10-CM | POA: Diagnosis not present

## 2016-06-21 ENCOUNTER — Ambulatory Visit (INDEPENDENT_AMBULATORY_CARE_PROVIDER_SITE_OTHER): Payer: Medicare Other | Admitting: *Deleted

## 2016-06-21 DIAGNOSIS — T63441D Toxic effect of venom of bees, accidental (unintentional), subsequent encounter: Secondary | ICD-10-CM | POA: Diagnosis not present

## 2016-06-21 DIAGNOSIS — IMO0001 Reserved for inherently not codable concepts without codable children: Secondary | ICD-10-CM

## 2016-06-25 DIAGNOSIS — J40 Bronchitis, not specified as acute or chronic: Secondary | ICD-10-CM | POA: Diagnosis not present

## 2016-06-27 DIAGNOSIS — R490 Dysphonia: Secondary | ICD-10-CM | POA: Diagnosis not present

## 2016-06-27 DIAGNOSIS — R05 Cough: Secondary | ICD-10-CM | POA: Diagnosis not present

## 2016-06-27 DIAGNOSIS — J40 Bronchitis, not specified as acute or chronic: Secondary | ICD-10-CM | POA: Diagnosis not present

## 2016-07-11 ENCOUNTER — Other Ambulatory Visit: Payer: Self-pay | Admitting: General Surgery

## 2016-07-11 DIAGNOSIS — K4091 Unilateral inguinal hernia, without obstruction or gangrene, recurrent: Secondary | ICD-10-CM | POA: Diagnosis not present

## 2016-07-11 DIAGNOSIS — I10 Essential (primary) hypertension: Secondary | ICD-10-CM | POA: Diagnosis not present

## 2016-07-11 DIAGNOSIS — K219 Gastro-esophageal reflux disease without esophagitis: Secondary | ICD-10-CM | POA: Diagnosis not present

## 2016-07-11 DIAGNOSIS — F419 Anxiety disorder, unspecified: Secondary | ICD-10-CM | POA: Diagnosis not present

## 2016-07-11 DIAGNOSIS — Z6833 Body mass index (BMI) 33.0-33.9, adult: Secondary | ICD-10-CM | POA: Diagnosis not present

## 2016-07-18 NOTE — Patient Instructions (Signed)
Alex Castillo  07/18/2016   Your procedure is scheduled on: 07/25/16  Report to [facility] Main  Entrance take [place]  elevators to 3rd floor to  [place] at   [phone].  Call this number if you have problems the morning of surgery [phone]   Remember: ONLY 1 PERSON MAY GO WITH YOU TO SHORT STAY TO GET  READY MORNING OF [place].  Do not eat food or drink liquids :After Midnight.     Take these medicines the morning of surgery with A SIP OF WATER: Rabeprazole  (aciphex), Advair and albuterol if needed and bring, clarinex, norvasc .[name]                                You may not have any metal on your body including hair pins and              piercings  Do not wear jewelry,lotions, powders or perfumes, deodorant                         Men may shave face and neck.   Do not bring valuables to the hospital. [place].  Contacts, dentures or bridgework may not be worn into surgery.       Patients discharged the day of surgery will not be allowed to drive home.  Name and phone number of your driver:  Special Instructions: N/A              Please read over the following fact sheets you were given: _____________________________________________________________________             [facility] - Preparing for Surgery Before surgery, you can play an important role.  Because skin is not sterile, your skin needs to be as free of germs as possible.  You can reduce the number of germs on your skin by washing with CHG (chlorahexidine gluconate) soap before surgery.  CHG is an antiseptic cleaner which kills germs and bonds with the skin to continue killing germs even after washing. Please DO NOT use if you have an allergy to CHG or antibacterial soaps.  If your skin becomes reddened/irritated stop using the CHG and inform your nurse when you arrive at Short Stay. Do not shave (including legs and underarms) for  at least 48 hours prior to the first CHG shower.  You may shave your face/neck. Please follow these instructions carefully:  1.  Shower with CHG Soap the night before surgery and the  morning of Surgery.  2.  If you choose to wash your hair, wash your hair first as usual with your  normal  shampoo.  3.  After you shampoo, rinse your hair and body thoroughly to remove the  shampoo.                           4.  Use CHG as you would any other liquid soap.  You can apply chg directly  to the skin and wash                       Gently with a scrungie or clean washcloth.  5.  Apply the CHG Soap to your body ONLY FROM THE NECK DOWN.   Do not use on face/ open                           Wound or open sores. Avoid contact with eyes, ears mouth and genitals (private parts).                       Wash face,  Genitals (private parts) with your normal soap.             6.  Wash thoroughly, paying special attention to the area where your surgery  will be performed.  7.  Thoroughly rinse your body with warm water from the neck down.  8.  DO NOT shower/wash with your normal soap after using and rinsing off  the CHG Soap.                9.  Pat yourself dry with a clean towel.            10.  Wear clean pajamas.            11.  Place clean sheets on your bed the night of your first shower and do not  sleep with pets. Day of Surgery : Do not apply any lotions/deodorants the morning of surgery.  Please wear clean clothes to the hospital/surgery center.  FAILURE TO FOLLOW THESE INSTRUCTIONS MAY RESULT IN THE CANCELLATION OF YOUR SURGERY PATIENT SIGNATURE_________________________________  NURSE SIGNATURE__________________________________  ________________________________________________________________________

## 2016-07-23 ENCOUNTER — Encounter (HOSPITAL_COMMUNITY)
Admission: RE | Admit: 2016-07-23 | Discharge: 2016-07-23 | Disposition: A | Discharge status: Home / Self Care | Payer: Medicare Other | Source: Ambulatory Visit | Attending: General Surgery | Admitting: General Surgery

## 2016-07-23 ENCOUNTER — Encounter (HOSPITAL_COMMUNITY): Payer: Self-pay

## 2016-07-23 DIAGNOSIS — I10 Essential (primary) hypertension: Secondary | ICD-10-CM | POA: Diagnosis not present

## 2016-07-23 DIAGNOSIS — Z6832 Body mass index (BMI) 32.0-32.9, adult: Secondary | ICD-10-CM | POA: Diagnosis not present

## 2016-07-23 DIAGNOSIS — E669 Obesity, unspecified: Secondary | ICD-10-CM | POA: Diagnosis not present

## 2016-07-23 DIAGNOSIS — K4090 Unilateral inguinal hernia, without obstruction or gangrene, not specified as recurrent: Secondary | ICD-10-CM

## 2016-07-23 DIAGNOSIS — Z823 Family history of stroke: Secondary | ICD-10-CM | POA: Diagnosis not present

## 2016-07-23 DIAGNOSIS — Z885 Allergy status to narcotic agent status: Secondary | ICD-10-CM | POA: Diagnosis not present

## 2016-07-23 DIAGNOSIS — Z801 Family history of malignant neoplasm of trachea, bronchus and lung: Secondary | ICD-10-CM | POA: Diagnosis not present

## 2016-07-23 DIAGNOSIS — K4091 Unilateral inguinal hernia, without obstruction or gangrene, recurrent: Secondary | ICD-10-CM | POA: Diagnosis not present

## 2016-07-23 DIAGNOSIS — F419 Anxiety disorder, unspecified: Secondary | ICD-10-CM | POA: Diagnosis not present

## 2016-07-23 DIAGNOSIS — Z79899 Other long term (current) drug therapy: Secondary | ICD-10-CM | POA: Diagnosis not present

## 2016-07-23 DIAGNOSIS — Z01810 Encounter for preprocedural cardiovascular examination: Secondary | ICD-10-CM

## 2016-07-23 DIAGNOSIS — Z7982 Long term (current) use of aspirin: Secondary | ICD-10-CM | POA: Diagnosis not present

## 2016-07-23 DIAGNOSIS — Z01812 Encounter for preprocedural laboratory examination: Secondary | ICD-10-CM | POA: Insufficient documentation

## 2016-07-23 DIAGNOSIS — K219 Gastro-esophageal reflux disease without esophagitis: Secondary | ICD-10-CM | POA: Diagnosis not present

## 2016-07-23 DIAGNOSIS — K449 Diaphragmatic hernia without obstruction or gangrene: Secondary | ICD-10-CM | POA: Diagnosis not present

## 2016-07-23 HISTORY — DX: Other complications of anesthesia, initial encounter: T88.59XA

## 2016-07-23 HISTORY — DX: Malignant (primary) neoplasm, unspecified: C80.1

## 2016-07-23 HISTORY — DX: Adverse effect of unspecified anesthetic, initial encounter: T41.45XA

## 2016-07-23 HISTORY — DX: Unspecified staphylococcus as the cause of diseases classified elsewhere: B95.8

## 2016-07-23 HISTORY — DX: Unspecified osteoarthritis, unspecified site: M19.90

## 2016-07-23 LAB — CBC WITH DIFFERENTIAL/PLATELET
Basophils Absolute: 0.1 10*3/uL (ref 0.0–0.1)
Basophils Relative: 1 %
Eosinophils Absolute: 0.2 10*3/uL (ref 0.0–0.7)
Eosinophils Relative: 3 %
HCT: 42.6 % (ref 39.0–52.0)
Hemoglobin: 14.9 g/dL (ref 13.0–17.0)
Lymphocytes Relative: 31 %
Lymphs Abs: 2.1 10*3/uL (ref 0.7–4.0)
MCH: 30.5 pg (ref 26.0–34.0)
MCHC: 35 g/dL (ref 30.0–36.0)
MCV: 87.3 fL (ref 78.0–100.0)
Monocytes Absolute: 0.5 10*3/uL (ref 0.1–1.0)
Monocytes Relative: 7 %
Neutro Abs: 4 10*3/uL (ref 1.7–7.7)
Neutrophils Relative %: 58 %
Platelets: 186 10*3/uL (ref 150–400)
RBC: 4.88 MIL/uL (ref 4.22–5.81)
RDW: 12.8 % (ref 11.5–15.5)
WBC: 7 10*3/uL (ref 4.0–10.5)

## 2016-07-23 LAB — COMPREHENSIVE METABOLIC PANEL
ALT: 36 U/L (ref 17–63)
AST: 30 U/L (ref 15–41)
Albumin: 4.4 g/dL (ref 3.5–5.0)
Alkaline Phosphatase: 69 U/L (ref 38–126)
Anion gap: 8 (ref 5–15)
BUN: 14 mg/dL (ref 6–20)
CO2: 27 mmol/L (ref 22–32)
Calcium: 9.5 mg/dL (ref 8.9–10.3)
Chloride: 100 mmol/L — ABNORMAL LOW (ref 101–111)
Creatinine, Ser: 0.84 mg/dL (ref 0.61–1.24)
GFR calc Af Amer: >60 mL/min (ref >60)
GFR calc non Af Amer: >60 mL/min (ref >60)
Glucose, Bld: 223 mg/dL — ABNORMAL HIGH (ref 65–99)
Potassium: 3.8 mmol/L (ref 3.5–5.1)
Sodium: 135 mmol/L (ref 135–145)
Total Bilirubin: 0.8 mg/dL (ref 0.3–1.2)
Total Protein: 7.6 g/dL (ref 6.5–8.1)

## 2016-07-23 NOTE — Progress Notes (Signed)
CMP done 3/12 routed to Dr. Renelda Loma via epic

## 2016-07-24 DIAGNOSIS — R351 Nocturia: Secondary | ICD-10-CM | POA: Diagnosis not present

## 2016-07-24 DIAGNOSIS — N401 Enlarged prostate with lower urinary tract symptoms: Secondary | ICD-10-CM | POA: Diagnosis not present

## 2016-07-24 NOTE — H&P (Signed)
Alex Castillo Location: [facility] Patient #: [id number] DOB: 1945/06/04 Married / Language: English / Race: White Male       History of Present Illness  The patient is a 71 year old male who presents for an evaluation of a hernia. This is a pleasant 71 year old gentleman from [place] Referred by Dr. Townsend Castillo at [place] [facility], [place]. He is referred for a recurrent left inguinal hernia.      He states that a Psychologist, sport and exercise in [place] repaired a left inguinal hernia 40 years ago. He doesn't recall any mention of mesh but states that they used nylon wire to repair it. 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 He developed chronic neuropathic pain in the left groin and he states that him Alex Castillo in the left groin and says that he found that a nerve had been entrapped by a suture. This was either repaired or neural lysis was done and the pain has gone. He also had a right inguinal hernia repair 40 years ago but has no symptoms on that side.      In November 2017 he fell off a truck and has noticed a lump in little bit of discomfort in the left groin. The lump is slightly larger. Not much pain. No history of incarceration.        Comorbidities include anxiety on Xanax. Hypertension on 2 drugs. Takes aspirin. GERD on Protonix. Has had back surgery. Colonoscopy 3 years ago. Prior left inguinal hernia and right anal hernia repair 40 years ago. Left groin exploration by Dr. Lennie Castillo for likely neuro lysis with good resolution of neuropathic pain. 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 Place tennis regularly. Denies tobacco.       I went over patient information booklets about open repair and laparoscopic repair. He seems to be a good candidate for attempted laparoscopic  preperitoneal repair since he's had 2 left groin surgeries. I told him the surgery was elective because of his minimal symptoms but that most likely the hernia would progress. He would like to have this done sometime in the early spring so he can get back to playing tennis this summer.      He will be scheduled for laparoscopic repair of left inguinal hernia with mesh, possible open repair in the near future. I discussed the indications, details, techniques, and numerous risk of the surgery with him. He is aware the risks of bleeding, infection, recurrence of the hernia, nerve damage with chronic pain, injury to adjacent organs of intestine or bladder with major reconstructive surgery, urinary retention, umbilical hernia, and other unforeseen problems. He understands these issues. All of his questions are answered. He agrees with this plan.   Allergies  HYDROmorphone HCl *ANALGESICS - OPIOID*  Anaphylaxis.  Medication History  Analpram-HC (1-1% Cream, 1 (one) Cream Rectal two times daily, as needed, Taken starting 11/29/2014) Active. (Apply to affected area QID PRN) Xanax (0.25MG  Tablet, Oral as needed) Active. Norvasc (5MG  Tablet, Oral) Active. Aspirin (81MG  Tablet, Oral) Active. Vitamin D3 (5000UNIT Tablet, Oral) Active. Cialis (10MG  Tablet, Oral) Active. KlonoPIN (0.5MG  Tablet, Oral) Active. Co Q 10 (10MG  Capsule, Oral) Active. Cranberry (1000MG  Capsule, Oral) Active. EPINEPHrine (0.3MG /0.3ML Device, Injection) Active. Fish Oil Concentrate (1000MG  Capsule, Oral) Active. Fluticasone Furoate (200MCG/ACT Aero Pow Br Act, Inhalation)  Active. Cozaar (100MG  Tablet, Oral) Active. Robaxin (500MG  Tablet, Oral) Active. Centrum Silver Ultra Mens (Oral) Active. Nystatin-Triamcinolone (100000-0.1UNIT/GM-% Ointment, External) Active. Protonix (40MG  Tablet DR, Oral) Active. Zantac (150MG  Tablet, Oral) Active. Flomax (0.4MG  Capsule, Oral) Active. Nasacort (55MCG/ACT Inhaler,  Nasal) Active. Vitamin E (200UNIT Capsule, Oral) Active. Benefiber Drink Mix (Oral) Active. Medications Reconciled  Vitals  Weight: 233 lb Height: 70in Body Surface Area: 2.23 m Body Mass Index: 33.43 kg/m  Temp.: 98.8F  Pulse: 79 (Regular)  BP: 142/92 (Sitting, Left Arm, Standard)       Physical Exam General Mental Status-Alert. General Appearance-Consistent with stated age. Hydration-Well hydrated. Voice-Normal.  Head and Neck Head-normocephalic, atraumatic with no lesions or palpable masses. Trachea-midline. Thyroid Gland Characteristics - normal size and consistency.  Eye Eyeball - Bilateral-Extraocular movements intact. Sclera/Conjunctiva - Bilateral-No scleral icterus.  Chest and Lung Exam Chest and lung exam reveals -quiet, even and easy respiratory effort with no use of accessory muscles and on auscultation, normal breath sounds, no adventitious sounds and normal vocal resonance. Inspection Chest Wall - Normal. Back - normal.  Breast Breast - Left-Symmetric, Non Tender, No Biopsy scars, no Dimpling, No Inflammation, No Lumpectomy scars, No Mastectomy scars, No Peau d' Orange. Breast - Right-Symmetric, Non Tender, No Biopsy scars, no Dimpling, No Inflammation, No Lumpectomy scars, No Mastectomy scars, No Peau d' Orange. Breast Lump-No Palpable Breast Mass.  Cardiovascular Cardiovascular examination reveals -normal heart sounds, regular rate and rhythm with no murmurs and normal pedal pulses bilaterally.  Abdomen Inspection Inspection of the abdomen reveals - No Hernias. Skin - Scar - no surgical scars. Palpation/Percussion Palpation and Percussion of the abdomen reveal - Soft, Non Tender, No Rebound tenderness, No Rigidity (guarding) and No hepatosplenomegaly. Auscultation Auscultation of the abdomen reveals - Bowel sounds normal.  Male Genitourinary Note: Bilateral inguinal incisions. They are healed. Skin is  healthy. No adenopathy. Examined supine and standing. He has a small reducible ,but obvious left inguinal hernia that protrudes out but not into the scrotum. No evidence of hernia on the right.   Neurologic Neurologic evaluation reveals -alert and oriented x 3 with no impairment of recent or remote memory. Mental Status-Normal.  Musculoskeletal Normal Exam - Left-Upper Extremity Strength Normal and Lower Extremity Strength Normal. Normal Exam - Right-Upper Extremity Strength Normal and Lower Extremity Strength Normal.  Lymphatic Head & Neck  General Head & Neck Lymphatics: Bilateral - Description - Normal. Axillary  General Axillary Region: Bilateral - Description - Normal. Tenderness - Non Tender. Femoral & Inguinal  Generalized Femoral & Inguinal Lymphatics: Bilateral - Description - Normal. Tenderness - Non Tender.    Assessment & Plan  RECURRENT LEFT INGUINAL HERNIA (K40.91)   You have a reducible, recurrent left inguinal hernia It is not clear whether you had mesh placed 40 years ago, but possibly not There is no emergency, but over time the hernia will likely progress in size and in discomfort Elective repair is advised that your convenience  you stated that you would like to go ahead and have this repaired before it gets much bigger, and that is reasonable  Because there had been 2 operations in the left groin, we will attempt to repair your left inguinal hernia with a laparoscopic approach, as we discussed Please read the printed information that we went over We discussed the indications, techniques, and risks of this surgery in detail  HYPERTENSION, BENIGN (I10) CHRONIC GERD (K21.9) ANXIETY, MILD (F41.9) Impression: Takes Xanax. Controlled BMI 33.0-33.9,ADULT (W09.81)    Edsel Petrin. Dalbert Batman, M.D.,  [facility] General and Minimally invasive Surgery Breast and Colorectal Surgery Office:   [phone] Pager:   [phone]

## 2016-07-24 NOTE — Anesthesia Preprocedure Evaluation (Addendum)
Anesthesia Evaluation  Patient identified by MRN, date of birth, ID band Patient awake    Reviewed: Allergy & Precautions, H&P , NPO status , Patient's Chart, lab work & pertinent test results  Airway Mallampati: IV  TM Distance: >3 FB Neck ROM: Full  Mouth opening: Limited Mouth Opening  Dental no notable dental hx. (+) Dental Advisory Given, Teeth Intact   Pulmonary neg pulmonary ROS,    Pulmonary exam normal breath sounds clear to auscultation       Cardiovascular Exercise Tolerance: Good hypertension, Pt. on medications Normal cardiovascular exam(-) dysrhythmias  Rhythm:Regular Rate:Normal     Neuro/Psych PSYCHIATRIC DISORDERS Anxiety negative neurological ROS     GI/Hepatic Neg liver ROS, hiatal hernia, GERD  Medicated,  Endo/Other  negative endocrine ROS  Renal/GU negative Renal ROS     Musculoskeletal negative musculoskeletal ROS (+)   Abdominal (+) + obese,   Peds  Hematology negative hematology ROS (+)   Anesthesia Other Findings   Reproductive/Obstetrics                            Anesthesia Physical  Anesthesia Plan  ASA: II  Anesthesia Plan: General   Post-op Pain Management:    Induction: Intravenous  Airway Management Planned: Oral ETT  Additional Equipment:   Intra-op Plan:   Post-operative Plan: Extubation in OR  Informed Consent: I have reviewed the patients History and Physical, chart, labs and discussed the procedure including the risks, benefits and alternatives for the proposed anesthesia with the patient or authorized representative who has indicated his/her understanding and acceptance.   Dental advisory given  Plan Discussed with: CRNA  Anesthesia Plan Comments:         Anesthesia Quick Evaluation

## 2016-07-25 ENCOUNTER — Ambulatory Visit (HOSPITAL_COMMUNITY): Payer: Medicare Other | Admitting: Anesthesiology

## 2016-07-25 ENCOUNTER — Encounter (HOSPITAL_COMMUNITY)
Admission: RE | Disposition: A | Discharge status: Home / Self Care | Payer: Self-pay | Source: Ambulatory Visit | Attending: General Surgery

## 2016-07-25 ENCOUNTER — Ambulatory Visit (HOSPITAL_COMMUNITY)
Admission: RE | Admit: 2016-07-25 | Discharge: 2016-07-25 | Disposition: A | Discharge status: Home / Self Care | Payer: Medicare Other | Source: Ambulatory Visit | Attending: General Surgery | Admitting: General Surgery

## 2016-07-25 ENCOUNTER — Encounter (HOSPITAL_COMMUNITY): Payer: Self-pay | Admitting: *Deleted

## 2016-07-25 DIAGNOSIS — Z6832 Body mass index (BMI) 32.0-32.9, adult: Secondary | ICD-10-CM | POA: Diagnosis not present

## 2016-07-25 DIAGNOSIS — F419 Anxiety disorder, unspecified: Secondary | ICD-10-CM | POA: Insufficient documentation

## 2016-07-25 DIAGNOSIS — K449 Diaphragmatic hernia without obstruction or gangrene: Secondary | ICD-10-CM | POA: Diagnosis not present

## 2016-07-25 DIAGNOSIS — Z885 Allergy status to narcotic agent status: Secondary | ICD-10-CM | POA: Insufficient documentation

## 2016-07-25 DIAGNOSIS — K219 Gastro-esophageal reflux disease without esophagitis: Secondary | ICD-10-CM | POA: Diagnosis not present

## 2016-07-25 DIAGNOSIS — Z79899 Other long term (current) drug therapy: Secondary | ICD-10-CM | POA: Diagnosis not present

## 2016-07-25 DIAGNOSIS — K4091 Unilateral inguinal hernia, without obstruction or gangrene, recurrent: Secondary | ICD-10-CM | POA: Diagnosis not present

## 2016-07-25 DIAGNOSIS — K4090 Unilateral inguinal hernia, without obstruction or gangrene, not specified as recurrent: Secondary | ICD-10-CM | POA: Diagnosis not present

## 2016-07-25 DIAGNOSIS — I10 Essential (primary) hypertension: Secondary | ICD-10-CM | POA: Diagnosis not present

## 2016-07-25 DIAGNOSIS — Z801 Family history of malignant neoplasm of trachea, bronchus and lung: Secondary | ICD-10-CM | POA: Insufficient documentation

## 2016-07-25 DIAGNOSIS — Z7982 Long term (current) use of aspirin: Secondary | ICD-10-CM | POA: Insufficient documentation

## 2016-07-25 DIAGNOSIS — J45909 Unspecified asthma, uncomplicated: Secondary | ICD-10-CM | POA: Diagnosis not present

## 2016-07-25 DIAGNOSIS — Z823 Family history of stroke: Secondary | ICD-10-CM | POA: Diagnosis not present

## 2016-07-25 DIAGNOSIS — E669 Obesity, unspecified: Secondary | ICD-10-CM | POA: Insufficient documentation

## 2016-07-25 HISTORY — PX: INGUINAL HERNIA REPAIR: SHX194

## 2016-07-25 HISTORY — PX: INSERTION OF MESH: SHX5868

## 2016-07-25 LAB — GLUCOSE, CAPILLARY: Glucose-Capillary: 224 mg/dL — ABNORMAL HIGH (ref 65–99)

## 2016-07-25 SURGERY — REPAIR, HERNIA, INGUINAL, LAPAROSCOPIC
Anesthesia: General | Site: Abdomen | Laterality: Left

## 2016-07-25 MED ORDER — CEFAZOLIN SODIUM-DEXTROSE 2-4 GM/100ML-% IV SOLN
INTRAVENOUS | Status: AC
  Filled 2016-07-25: qty 100

## 2016-07-25 MED ORDER — BUPIVACAINE-EPINEPHRINE (PF) 0.5% -1:200000 IJ SOLN
INTRAMUSCULAR | Status: AC
  Filled 2016-07-25: qty 30

## 2016-07-25 MED ORDER — SUGAMMADEX SODIUM 500 MG/5ML IV SOLN
INTRAVENOUS | Status: AC
  Filled 2016-07-25: qty 5

## 2016-07-25 MED ORDER — PROPOFOL 10 MG/ML IV BOLUS
INTRAVENOUS | Status: DC | PRN
  Administered 2016-07-25: 200 mg via INTRAVENOUS

## 2016-07-25 MED ORDER — FENTANYL CITRATE (PF) 100 MCG/2ML IJ SOLN
INTRAMUSCULAR | Status: AC
  Filled 2016-07-25: qty 2

## 2016-07-25 MED ORDER — LIDOCAINE 2% (20 MG/ML) 5 ML SYRINGE
INTRAMUSCULAR | Status: DC | PRN
  Administered 2016-07-25: 100 mg via INTRAVENOUS

## 2016-07-25 MED ORDER — SODIUM CHLORIDE 0.9% FLUSH
3.0000 mL | INTRAVENOUS | Status: DC | PRN
Start: 2016-07-25 — End: 2016-07-25

## 2016-07-25 MED ORDER — ACETAMINOPHEN 650 MG RE SUPP
650.0000 mg | RECTAL | Status: DC | PRN
  Filled 2016-07-25: qty 1

## 2016-07-25 MED ORDER — LACTATED RINGERS IV SOLN
INTRAVENOUS | Status: DC
  Administered 2016-07-25: 12:00:00 via INTRAVENOUS

## 2016-07-25 MED ORDER — SUCCINYLCHOLINE CHLORIDE 200 MG/10ML IV SOSY
PREFILLED_SYRINGE | INTRAVENOUS | Status: AC
  Filled 2016-07-25: qty 10

## 2016-07-25 MED ORDER — 0.9 % SODIUM CHLORIDE (POUR BTL) OPTIME
TOPICAL | Status: DC | PRN
  Administered 2016-07-25: 1000 mL

## 2016-07-25 MED ORDER — DEXAMETHASONE SODIUM PHOSPHATE 10 MG/ML IJ SOLN
INTRAMUSCULAR | Status: AC
  Filled 2016-07-25: qty 1

## 2016-07-25 MED ORDER — PROPOFOL 10 MG/ML IV BOLUS
INTRAVENOUS | Status: AC
  Filled 2016-07-25: qty 20

## 2016-07-25 MED ORDER — MIDAZOLAM HCL 2 MG/2ML IJ SOLN
INTRAMUSCULAR | Status: AC
  Filled 2016-07-25: qty 2

## 2016-07-25 MED ORDER — CELECOXIB 200 MG PO CAPS
400.0000 mg | ORAL_CAPSULE | ORAL | Status: AC
  Administered 2016-07-25: 400 mg via ORAL
  Filled 2016-07-25: qty 2

## 2016-07-25 MED ORDER — LIDOCAINE 2% (20 MG/ML) 5 ML SYRINGE
INTRAMUSCULAR | Status: AC
  Filled 2016-07-25: qty 5

## 2016-07-25 MED ORDER — GABAPENTIN 300 MG PO CAPS
300.0000 mg | ORAL_CAPSULE | ORAL | Status: AC
  Administered 2016-07-25: 300 mg via ORAL
  Filled 2016-07-25: qty 1

## 2016-07-25 MED ORDER — LACTATED RINGERS IR SOLN
Status: DC | PRN
  Administered 2016-07-25: 1000 mL

## 2016-07-25 MED ORDER — SODIUM CHLORIDE 0.9% FLUSH: 3.0000 mL | Freq: Two times a day (BID) | INTRAVENOUS | Status: DC

## 2016-07-25 MED ORDER — MIDAZOLAM HCL 5 MG/5ML IJ SOLN
INTRAMUSCULAR | Status: DC | PRN
  Administered 2016-07-25: 2 mg via INTRAVENOUS

## 2016-07-25 MED ORDER — ACETAMINOPHEN 500 MG PO TABS
1000.0000 mg | ORAL_TABLET | ORAL | Status: AC
  Administered 2016-07-25: 1000 mg via ORAL
  Filled 2016-07-25: qty 2

## 2016-07-25 MED ORDER — SUGAMMADEX SODIUM 500 MG/5ML IV SOLN
INTRAVENOUS | Status: DC | PRN
  Administered 2016-07-25: 400 mg via INTRAVENOUS

## 2016-07-25 MED ORDER — SODIUM CHLORIDE 0.9 % IV SOLN: 250.0000 mL | INTRAVENOUS | Status: DC | PRN

## 2016-07-25 MED ORDER — OXYCODONE HCL 5 MG PO TABS: 5.0000 mg | ORAL_TABLET | ORAL | Status: DC | PRN

## 2016-07-25 MED ORDER — CEFAZOLIN SODIUM-DEXTROSE 2-4 GM/100ML-% IV SOLN
2.0000 g | INTRAVENOUS | Status: AC
  Administered 2016-07-25: 2 g via INTRAVENOUS

## 2016-07-25 MED ORDER — ONDANSETRON HCL 4 MG/2ML IJ SOLN
INTRAMUSCULAR | Status: DC | PRN
  Administered 2016-07-25: 4 mg via INTRAVENOUS

## 2016-07-25 MED ORDER — DEXAMETHASONE SODIUM PHOSPHATE 10 MG/ML IJ SOLN
INTRAMUSCULAR | Status: DC | PRN
  Administered 2016-07-25: 10 mg via INTRAVENOUS

## 2016-07-25 MED ORDER — SUCCINYLCHOLINE CHLORIDE 200 MG/10ML IV SOSY
PREFILLED_SYRINGE | INTRAVENOUS | Status: DC | PRN
  Administered 2016-07-25: 140 mg via INTRAVENOUS

## 2016-07-25 MED ORDER — BUPIVACAINE-EPINEPHRINE 0.5% -1:200000 IJ SOLN
INTRAMUSCULAR | Status: DC | PRN
  Administered 2016-07-25: 10 mL

## 2016-07-25 MED ORDER — MEPERIDINE HCL 50 MG/ML IJ SOLN: 6.2500 mg | INTRAMUSCULAR | Status: DC | PRN

## 2016-07-25 MED ORDER — FENTANYL CITRATE (PF) 100 MCG/2ML IJ SOLN: 25.0000 ug | INTRAMUSCULAR | Status: DC | PRN

## 2016-07-25 MED ORDER — LACTATED RINGERS IV SOLN: INTRAVENOUS | Status: DC

## 2016-07-25 MED ORDER — LACTATED RINGERS IV SOLN
INTRAVENOUS | Status: DC | PRN
  Administered 2016-07-25: 08:00:00 via INTRAVENOUS

## 2016-07-25 MED ORDER — HYDROCODONE-ACETAMINOPHEN 5-325 MG PO TABS: 1.0000 | ORAL_TABLET | Freq: Four times a day (QID) | ORAL | 0 refills | Status: DC | PRN

## 2016-07-25 MED ORDER — FENTANYL CITRATE (PF) 100 MCG/2ML IJ SOLN
INTRAMUSCULAR | Status: DC | PRN
  Administered 2016-07-25 (×2): 100 ug via INTRAVENOUS

## 2016-07-25 MED ORDER — ROCURONIUM BROMIDE 10 MG/ML (PF) SYRINGE
PREFILLED_SYRINGE | INTRAVENOUS | Status: DC | PRN
  Administered 2016-07-25: 10 mg via INTRAVENOUS
  Administered 2016-07-25: 40 mg via INTRAVENOUS
  Administered 2016-07-25: 10 mg via INTRAVENOUS

## 2016-07-25 MED ORDER — ONDANSETRON HCL 4 MG/2ML IJ SOLN
INTRAMUSCULAR | Status: AC
  Filled 2016-07-25: qty 2

## 2016-07-25 MED ORDER — ROCURONIUM BROMIDE 50 MG/5ML IV SOSY
PREFILLED_SYRINGE | INTRAVENOUS | Status: AC
  Filled 2016-07-25: qty 5

## 2016-07-25 MED ORDER — PROMETHAZINE HCL 25 MG/ML IJ SOLN: 6.2500 mg | INTRAMUSCULAR | Status: DC | PRN

## 2016-07-25 MED ORDER — ACETAMINOPHEN 325 MG PO TABS: 650.0000 mg | ORAL_TABLET | ORAL | Status: DC | PRN

## 2016-07-25 SURGICAL SUPPLY — 40 items
ADH SKN CLS APL DERMABOND .7 (GAUZE/BANDAGES/DRESSINGS) ×2
APL SKNCLS STERI-STRIP NONHPOA (GAUZE/BANDAGES/DRESSINGS)
APPLIER CLIP LOGIC TI 5 (MISCELLANEOUS) ×1 IMPLANT
APR CLP MED LRG 33X5 (MISCELLANEOUS) ×2
BENZOIN TINCTURE PRP APPL 2/3 (GAUZE/BANDAGES/DRESSINGS) IMPLANT
CABLE HIGH FREQUENCY MONO STRZ (ELECTRODE) ×1 IMPLANT
COVER SURGICAL LIGHT HANDLE (MISCELLANEOUS) ×3 IMPLANT
DECANTER SPIKE VIAL GLASS SM (MISCELLANEOUS) ×3 IMPLANT
DERMABOND ADVANCED (GAUZE/BANDAGES/DRESSINGS) ×1
DERMABOND ADVANCED .7 DNX12 (GAUZE/BANDAGES/DRESSINGS) IMPLANT
DEVICE SECURE STRAP 25 ABSORB (INSTRUMENTS) ×1 IMPLANT
DISSECT BALLN SPACEMKR + OVL (BALLOONS) ×3
DISSECTOR BALLN SPACEMKR + OVL (BALLOONS) ×2 IMPLANT
DISSECTOR BLUNT TIP ENDO 5MM (MISCELLANEOUS) IMPLANT
ELECT REM PT RETURN 9FT ADLT (ELECTROSURGICAL) ×3
ELECTRODE REM PT RTRN 9FT ADLT (ELECTROSURGICAL) ×2 IMPLANT
GLOVE EUDERMIC 7 POWDERFREE (GLOVE) ×3 IMPLANT
GOWN STRL REUS W/TWL XL LVL3 (GOWN DISPOSABLE) ×9 IMPLANT
IRRIG SUCT STRYKERFLOW 2 WTIP (MISCELLANEOUS)
IRRIGATION SUCT STRKRFLW 2 WTP (MISCELLANEOUS) IMPLANT
KIT BASIN OR (CUSTOM PROCEDURE TRAY) ×3 IMPLANT
MESH 3DMAX LIGHT 4.1X6.2 LT LR (Mesh General) ×1 IMPLANT
NDL INSUFFLATION 14GA 120MM (NEEDLE) IMPLANT
NEEDLE INSUFFLATION 14GA 120MM (NEEDLE) ×3 IMPLANT
PAD POSITIONING PINK XL (MISCELLANEOUS) IMPLANT
POSITIONER SURGICAL ARM (MISCELLANEOUS) IMPLANT
SCISSORS LAP 5X35 DISP (ENDOMECHANICALS) ×1 IMPLANT
SET IRRIG TUBING LAPAROSCOPIC (IRRIGATION / IRRIGATOR) ×1 IMPLANT
STOPCOCK 4 WAY LG BORE MALE ST (IV SETS) ×3 IMPLANT
STRIP CLOSURE SKIN 1/2X4 (GAUZE/BANDAGES/DRESSINGS) IMPLANT
SUT MNCRL AB 4-0 PS2 18 (SUTURE) ×3 IMPLANT
SUT VIC AB 3-0 SH 27 (SUTURE) ×3
SUT VIC AB 3-0 SH 27XBRD (SUTURE) IMPLANT
TACKER 5MM HERNIA 3.5CML NAB (ENDOMECHANICALS) IMPLANT
TAPE CLOTH 4X10 WHT NS (GAUZE/BANDAGES/DRESSINGS) IMPLANT
TOWEL OR 17X26 10 PK STRL BLUE (TOWEL DISPOSABLE) ×3 IMPLANT
TOWEL OR NON WOVEN STRL DISP B (DISPOSABLE) ×3 IMPLANT
TRAY FOLEY W/METER SILVER 16FR (SET/KITS/TRAYS/PACK) IMPLANT
TRAY LAPAROSCOPIC (CUSTOM PROCEDURE TRAY) ×3 IMPLANT
TROCAR CANNULA W/PORT DUAL 5MM (MISCELLANEOUS) ×3 IMPLANT

## 2016-07-25 NOTE — Anesthesia Postprocedure Evaluation (Signed)
Anesthesia Post Note  Patient: Alex Castillo  Procedure(s) Performed: Procedure(s) (LRB): LAPAROSCOPIC REPAIR RECURRENT LEFT INGUINAL HERNIA WITH MESH POSSIBLE OPEN (Left) INSERTION OF MESH (Left)  Patient location during evaluation: PACU Anesthesia Type: General Level of consciousness: sedated and patient cooperative Pain management: pain level controlled Vital Signs Assessment: post-procedure vital signs reviewed and stable Respiratory status: spontaneous breathing Cardiovascular status: stable Anesthetic complications: no       Last Vitals:  Vitals:   07/25/16 1115 07/25/16 1120  BP: (!) 146/98 (!) 146/98  Pulse: 80 82  Resp: 15 (!) 21  Temp: 36.6 C     Last Pain:  Vitals:   07/25/16 1115  TempSrc:   PainSc: 0-No pain                 Nolon Nations

## 2016-07-25 NOTE — Op Note (Signed)
Patient Name:           Alex Castillo   Date of Surgery:        07/25/2016  Pre op Diagnosis:      Recurrent left inguinal hernia  Post op Diagnosis:    Recurrent left inguinal hernia  Procedure:                 Laparoscopic, preperitoneal repair of recurrent left inguinal hernia with 3-D max mesh, light.  Surgeon:                     Edsel Petrin. Dalbert Batman, M.D., FACS  Assistant:                      OR staff   Indication for Assistant: n/a  Operative Indications: This is a pleasant 71 year old gentleman from [place] Referred by Dr. Townsend Roger at [facility], [place]. He is referred for a recurrent left inguinal hernia.      He states that a Psychologist, sport and exercise in [place] repaired a left inguinal hernia 40 years ago. He doesn't recall any mention of mesh but states that they used nylon wire to repair it. 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 He developed chronic neuropathic pain in the left groin and he states that Dr. Chales Salmon explored s  the left groin and says that he found that a nerve had been entrapped by a suture. This was either repaired or neural lysis was done and the pain has gone. He also had a right inguinal hernia repair 40 years ago but has no symptoms on that side.      In November 2017 he fell off a truck and has noticed a lump in little bit of discomfort in the left groin. The lump is slightly larger. Not much pain. No history of incarceration.        Comorbidities include anxiety on Xanax. Hypertension on 2 drugs. Takes aspirin. GERD on Protonix. Has had back surgery. Colonoscopy 3 years ago. Prior left inguinal hernia and right anal hernia repair 40 years ago. Left groin exploration by Dr. Lennie Hummer for likely neuro lysis with good resolution of neuropathic pain.       I went over patient information booklets about open repair and laparoscopic repair. He seems to be a good candidate for attempted laparoscopic preperitoneal repair since he's had 2  left groin surgeries. I told him the surgery was elective because of his minimal symptoms but that most likely the hernia would progress. He would like to have this done sometime in the early spring so he can get back to playing tennis this summer.      He will be scheduled for laparoscopic repair of left inguinal hernia with mesh, possible open repair in the near future. I discussed the indications, details, techniques, and numerous risk of the surgery with him. He is aware the risks of bleeding, infection, recurrence of the hernia, nerve damage with chronic pain, injury to adjacent organs of intestine or bladder with major reconstructive surgery, urinary retention, umbilical hernia, and other unforeseen problems. He understands these issues. All of his questions are answered. He agrees with this plan.  Operative Findings:       There was a lot of scar tissue, presumably from previous surgery.  The preperitoneal balloon mostly deployed on the left side.  It was very difficult to dissect the peritoneum to the right of the midline and so  the trochars were all in the midline or to the left.  I found an indirect hernia which was larger than I thought it would be but I was able to pull this completely back and away.  Identified the cord structures and vas deferens.  I pulled a little bit of fat out of the direct space.  The mesh was secured with a secure strap device.  Procedure in Detail:          Following the induction of general endotracheal anesthesia the patient's abdomen and genitalia were prepped and draped in a sterile fashion.  Surgical timeout was performed.  Intravenous antibiotics were given.  0.5% Marcaine with epinephrine was used as local infiltration anesthetic.      A transverse incision was made at the lower rim of the umbilicus.  The fascia was incised transversely exposing the medial border of the left rectus muscle.  The dissector balloon was easily inserted into the left rectus sheath  in the midline down to just above the symphysis pubis.  Video camera was inserted and the balloon was inflated slowly under direct vision.  I could not get the balloon to deploy to the right of the midline.  After holding this in place for a few minutes the balloon was deflated and removed.  The trocar balloon was inflated and secured and the operating trocar connected to the insufflator and 15 mmHg.  The preperitoneal space insufflated.  We did have a pneumoperitoneum and we had to decompress that through a Veress needle in the right upper quadrant.  That was uneventful.      A 5 mm trocar was placed in the midline below the umbilicus and a 5 mm trocar placed in the left midabdomen.  We dissected fatty tissue off of the symphysis pubis and then some off of the Cooper's ligament to identify the structures.  We had to control one arterial vessel near the inferior epigastric with metal clips and divided that but hemostasis was good.  We carried the dissection laterally.  There is a lot of scar tissue around the cord structures and we slowly dissected that one layer at a time until we were able to separate the cord structures from what looked like a large lipoma or sliding hernia which was slowly pulled back and well away from the inguinal floor.  A little bit of fatty tissue was dissected out of the direct space.  The dissection appeared to be completely performed.  3-D max light mesh, large size was brought to the operative field and inserted into the preperitoneal space.  The mesh was positioned so as to overlap the midline slightly, overlapped Cooper's ligament slightly, and deployed laterally nicely.  The mesh was secured with a secure strap device.  I placed a few fixation firings of the midline and along the superior rim of Cooper's ligament.  I placed about 3 firings of the secure strap laterally but made sure that I could palpate the device through the abdominal wall to be sure that I stayed above the  iliopubic tract.  The mesh was smoothed out and looked like it covered the operative area and the inguinal floor nicely.  There was no bleeding.  The pneumoperitoneum was released and the trochars were removed.  The fascia at the umbilicus was closed with 2 figure-of-eight sutures of 0 Vicryl.  Skin incisions were closed with subcuticular sutures of 4-0 Monocryl and Dermabond.  Patient tolerated the procedure well was taken to PACU in stable condition.  EBL 20 mL or less.  Counts correct.  Complications none.     The patient was given a prescription for Norco for pain.  I logged on to the [employer] and reviewed his medication history.     Edsel Petrin. Dalbert Batman, M.D., FACS General and Minimally Invasive Surgery Breast and Colorectal Surgery  07/25/2016 10:11 AM

## 2016-07-25 NOTE — Interval H&P Note (Signed)
History and Physical Interval Note:  07/25/2016 7:48 AM  Alex Castillo  has presented today for surgery, with the diagnosis of Recurrent left inguinal hernia  The various methods of treatment have been discussed with the patient and family. After consideration of risks, benefits and other options for treatment, the patient has consented to  Procedure(s): LAPAROSCOPIC REPAIR RECURRENT LEFT INGUINAL HERNIA WITH MESH POSSIBLE OPEN (Left) INSERTION OF MESH (Left) as a surgical intervention .  The patient's history has been reviewed, patient examined, no change in status, stable for surgery.  I have reviewed the patient's chart and labs.  Questions were answered to the patient's satisfaction.     Adin Hector

## 2016-07-25 NOTE — Progress Notes (Signed)
Dr. Lissa Hoard notified of patient's CBG results in PACU-224-also made aware of patient having an episode of apnea with SA02S dropping-patient stimulated- SA02S THEN 98

## 2016-07-25 NOTE — Discharge Instructions (Signed)
General Anesthesia, Adult, Care After These instructions provide you with information about caring for yourself after your procedure. Your health care provider may also give you more specific instructions. Your treatment has been planned according to current medical practices, but problems sometimes occur. Call your health care provider if you have any problems or questions after your procedure. What can I expect after the procedure? After the procedure, it is common to have:  Vomiting.  A sore throat.  Mental slowness. It is common to feel:  Nauseous.  Cold or shivery.  Sleepy.  Tired.  Sore or achy, even in parts of your body where you did not have surgery. Follow these instructions at home: For at least 24 hours after the procedure:   Do not:  Participate in activities where you could fall or become injured.  Drive.  Use heavy machinery.  Drink alcohol.  Take sleeping pills or medicines that cause drowsiness.  Make important decisions or sign legal documents.  Take care of children on your own.  Rest. Eating and drinking   If you vomit, drink water, juice, or soup when you can drink without vomiting.  Drink enough fluid to keep your urine clear or pale yellow.  Make sure you have little or no nausea before eating solid foods.  Follow the diet recommended by your health care provider. General instructions   Have a responsible adult stay with you until you are awake and alert.  Return to your normal activities as told by your health care provider. Ask your health care provider what activities are safe for you.  Take over-the-counter and prescription medicines only as told by your health care provider.  If you smoke, do not smoke without supervision.  Keep all follow-up visits as told by your health care provider. This is important. Contact a health care provider if:  You continue to have nausea or vomiting at home, and medicines are not helpful.  You  cannot drink fluids or start eating again.  You cannot urinate after 8-12 hours.  You develop a skin rash.  You have fever.  You have increasing redness at the site of your procedure. Get help right away if:  You have difficulty breathing.  You have chest pain.  You have unexpected bleeding.  You feel that you are having a life-threatening or urgent problem. This information is not intended to replace advice given to you by your health care provider. Make sure you discuss any questions you have with your health care provider. Document Released: 08/06/2000 Document Revised: 10/03/2015 Document Reviewed: 04/14/2015 Elsevier Interactive Patient Education  2017 [place] _______[facility]  UMBILICAL OR INGUINAL HERNIA REPAIR: POST OP INSTRUCTIONS  Always review your discharge instruction sheet given to you by the facility where your surgery was performed. IF YOU HAVE DISABILITY OR FAMILY LEAVE FORMS, YOU MUST BRING THEM TO THE OFFICE FOR PROCESSING.   DO NOT GIVE THEM TO YOUR DOCTOR.  1. A  prescription for pain medication may be given to you upon discharge.  Take your pain medication as prescribed, if needed.  If narcotic pain medicine is not needed, then you may take acetaminophen (Tylenol) or ibuprofen (Advil) as needed. 2. Take your usually prescribed medications unless otherwise directed. If you need a refill on your pain medication, please contact your pharmacy.  They will contact our office to request authorization. Prescriptions will not be filled after 5 pm or on week-ends. 3. You should follow a light diet the first 24  hours after arrival home, such as soup and crackers, etc.  Be sure to include lots of fluids daily.  Resume your normal diet the day after surgery. 4.Most patients will experience some swelling and bruising around the umbilicus or in the groin and scrotum.  Ice packs and reclining will help.  Swelling and bruising can take several days  to resolve.  6. It is common to experience some constipation if taking pain medication after surgery.  Increasing fluid intake and taking a stool softener (such as Colace) will usually help or prevent this problem from occurring.  A mild laxative (Milk of Magnesia or Miralax) should be taken according to package directions if there are no bowel movements after 48 hours. 7. Unless discharge instructions indicate otherwise, you may remove your bandages 24-48 hours after surgery, and you may shower at that time.  You may have steri-strips (small skin tapes) in place directly over the incision.  These strips should be left on the skin for 7-10 days.  If your surgeon used skin glue on the incision, you may shower in 24 hours.  The glue will flake off over the next 2-3 weeks.  Any sutures or staples will be removed at the office during your follow-up visit. 8. ACTIVITIES:  You may resume regular (light) daily activities beginning the next day--such as daily self-care, walking, climbing stairs--gradually increasing activities as tolerated.  You may have sexual intercourse when it is comfortable.  Refrain from any heavy lifting or straining until approved by your doctor.  a.You may drive when you are no longer taking prescription pain medication, you can comfortably wear a seatbelt, and you can safely maneuver your car and apply brakes. b.RETURN TO WORK:   _____________________________________________  9.You should see your doctor in the office for a follow-up appointment approximately 2-3 weeks after your surgery.  Make sure that you call for this appointment within a day or two after you arrive home to insure a convenient appointment time. 10.OTHER INSTRUCTIONS: _________________________    _____________________________________  WHEN TO CALL YOUR DOCTOR: 1. Fever over 101.0 2. Inability to urinate 3. Nausea and/or vomiting 4. Extreme swelling or bruising 5. Continued bleeding from incision. 6. Increased  pain, redness, or drainage from the incision  The clinic staff is available to answer your questions during regular business hours.  Please dont hesitate to call and ask to speak to one of the nurses for clinical concerns.  If you have a medical emergency, go to the nearest emergency room or call 911.  A surgeon from [facility] is always on call at the hospital   [address], [place], [place], [place]  [address] ?  [address], [place], [place]   [address] [phone] ? [phone] ? FAX [phone] Web site: [email or web address]

## 2016-07-25 NOTE — Transfer of Care (Signed)
Immediate Anesthesia Transfer of Care Note  Patient: Alex Castillo  Procedure(s) Performed: Procedure(s): LAPAROSCOPIC REPAIR RECURRENT LEFT INGUINAL HERNIA WITH MESH POSSIBLE OPEN (Left) INSERTION OF MESH (Left)  Patient Location: PACU  Anesthesia Type:General  Level of Consciousness: sedated  Airway & Oxygen Therapy: Patient Spontanous Breathing and Patient connected to face mask oxygen  Post-op Assessment: Report given to RN and Post -op Vital signs reviewed and stable  Post vital signs: Reviewed and stable  Last Vitals:  Vitals:   07/25/16 0640 07/25/16 0701  BP: (!) 183/79 (!) 155/76  Pulse: 84   Resp: 18   Temp: 36.5 C     Last Pain:  Vitals:   07/25/16 0640  TempSrc: Oral      Patients Stated Pain Goal: 3 (55/97/41 6384)  Complications: No apparent anesthesia complications

## 2016-07-25 NOTE — Anesthesia Procedure Notes (Signed)
Procedure Name: Intubation Date/Time: 07/25/2016 8:32 AM Performed by: Lind Covert Pre-anesthesia Checklist: Patient identified, Emergency Drugs available, Suction available, Patient being monitored and Timeout performed Patient Re-evaluated:Patient Re-evaluated prior to inductionOxygen Delivery Method: Circle system utilized Preoxygenation: Pre-oxygenation with 100% oxygen Intubation Type: IV induction Laryngoscope Size: Mac, 4 and Glidescope Grade View: Grade I Tube type: Oral Number of attempts: 1 Airway Equipment and Method: Stylet Placement Confirmation: ETT inserted through vocal cords under direct vision,  positive ETCO2 and breath sounds checked- equal and bilateral Secured at: 22 cm Tube secured with: Tape Dental Injury: Teeth and Oropharynx as per pre-operative assessment  Difficulty Due To: Difficulty was anticipated, Difficult Airway- due to large tongue, Difficult Airway- due to anterior larynx and Difficult Airway- due to limited oral opening Future Recommendations: Recommend- induction with short-acting agent, and alternative techniques readily available

## 2016-08-13 ENCOUNTER — Ambulatory Visit: Payer: Medicare Other | Admitting: Cardiovascular Disease

## 2016-08-16 ENCOUNTER — Ambulatory Visit (INDEPENDENT_AMBULATORY_CARE_PROVIDER_SITE_OTHER): Payer: Medicare Other | Admitting: *Deleted

## 2016-08-16 DIAGNOSIS — T63441D Toxic effect of venom of bees, accidental (unintentional), subsequent encounter: Secondary | ICD-10-CM

## 2016-08-16 DIAGNOSIS — IMO0001 Reserved for inherently not codable concepts without codable children: Secondary | ICD-10-CM

## 2016-08-17 ENCOUNTER — Encounter: Payer: Self-pay | Admitting: *Deleted

## 2016-09-02 NOTE — Progress Notes (Signed)
Patient ID: Alex Castillo, male   DOB: [birth year], 71 y.o.   MRN: [id number] Alex Castillo returns for followup of his hypertension and palpitations and first degree AV block  (218 max ) . He doing well. Palpitations resolved. He is active playing tennis and doing elliptical training. His blood pressure seems to be under reasonable control  Diet is somewhat poor. He can do a better job with sodium and carbohydrates. He has been compliant with his medications. Is not having significant headache palpitations PND or orthopnea no chest pain no evidence of vascular disease claudication TIA or CVA.  Hates diuretic Makes him feel weak, poor sex drive and "arthritis" worse. This was stopped last year as a result. On ARB  Has had cold recently no fever productive cough self medicated with amoxacillin  6 weeks post hernia surgery with Dr Dalbert Batman  Not taking norvasc   ROS: Denies fever, malais, weight loss, blurry vision, decreased visual acuity, cough, sputum, SOB, hemoptysis, pleuritic pain, palpitaitons, heartburn, abdominal pain, melena, lower extremity edema, claudication, or rash.  All other systems reviewed and negative  General: BP (!) 150/100   Pulse 85   Ht 5\' 10"  (1.778 m)   Wt 229 lb (103.9 kg)   SpO2 96%   BMI 32.86 kg/m   Affect appropriate Healthy:  appears stated age 71: normal  Neck supple with no adenopathy JVP normal no bruits no thyromegaly Lungs clear with no wheezing and good diaphragmatic motion Heart:  S1/S2 no murmur, no rub, gallop or click PMI normal Abdomen: benighn, BS positve, no tenderness, no AAA post lap hernia surgery repair  no bruit.  No HSM or HJR Distal pulses intact with no bruits No edema Neuro non-focal Skin warm and dry No muscular weakness   Current Outpatient Prescriptions  Medication Sig Dispense Refill  . ALPRAZolam (XANAX) 0.25 MG tablet Take 1 tablet (0.25 mg total) by mouth at bedtime as needed for sleep. 30 tablet 5  . aspirin 81 MG tablet  Take 81 mg by mouth daily.      [name] b complex vitamins tablet Take 1 tablet by mouth daily.    . celecoxib (CELEBREX) 200 MG capsule Take 200 mg by mouth daily as needed for mild pain or moderate pain.    . Cholecalciferol (VITAMIN D3) 5000 units CAPS Take 5,000 Units by mouth daily.    . Coenzyme Q10 (COQ-10 PO) Take 1 capsule by mouth daily.    [name] desloratadine (CLARINEX) 5 MG tablet Take 5 mg by mouth daily as needed.    [name] EPINEPHrine 0.3 mg/0.3 mL IJ SOAJ injection Use as directed for life-threatening allergic reaction. 2 Device 3  . Glucosamine HCl (GLUCOSAMINE PO) Take 1 tablet by mouth daily.    . hydrochlorothiazide (MICROZIDE) 12.5 MG capsule Take 12.5 mg by mouth daily.    [name] losartan (COZAAR) 100 MG tablet TAKE ONE (1) TABLET BY MOUTH EVERY DAY 90 tablet 3  . methocarbamol (ROBAXIN) 500 MG tablet Take 500 mg by mouth daily as needed for muscle spasms.    . Omega-3 Fatty Acids (FISH OIL) 1200 MG CAPS Take 1,200 mg by mouth daily.    . RABEprazole (ACIPHEX) 20 MG tablet Take 1 tablet (20 mg total) by mouth daily. 90 tablet 3  . sildenafil (REVATIO) 20 MG tablet Take 20 mg by mouth daily as needed (ED).    . Triamcinolone Acetonide (NASACORT ALLERGY 24HR NA) Place 2 sprays into the nose daily as needed (allergies).     [name]  vitamin E 400 UNIT capsule Take 400 Units by mouth daily.     No current facility-administered medications for this visit.     Allergies  Bee venom and Hydromorphone hcl  Electrocardiogram:   11/07/13  SR rate 65 PR 218  Otherwise normal  09/09/14 SR rate 72  PR 214  Otherwise normal  07/23/16  SR rate 70 PR 210 otherwise normal  09/05/16  SR rate 80 normal   Assessment and Plan Palpitations:  Improved no structural heart disease HTN:  Elevated due to cold and lack of compliance with norvasc    Dizzy:  Sounds more like vertigo f/u primary consider ENT referral  First Degree:  Heart block ECG stable 07/23/16 PR 210  Yearly ECG  Hernia:  Post 6 weeks wounds healed well  f/u Dalbert Batman ok to play tennis Bronchitis: slowly improving self medicated with amoxacillin normal lung exam  And afebrile   F/U 6 months   Jenkins Rouge

## 2016-09-05 ENCOUNTER — Encounter: Payer: Self-pay | Admitting: Cardiovascular Disease

## 2016-09-05 ENCOUNTER — Encounter (INDEPENDENT_AMBULATORY_CARE_PROVIDER_SITE_OTHER): Payer: Self-pay

## 2016-09-05 ENCOUNTER — Ambulatory Visit (INDEPENDENT_AMBULATORY_CARE_PROVIDER_SITE_OTHER): Payer: Medicare Other | Admitting: Cardiovascular Disease

## 2016-09-05 VITALS — BP 150/100 | HR 85 | Ht 70.0 in | Wt 229.0 lb

## 2016-09-05 DIAGNOSIS — I440 Atrioventricular block, first degree: Secondary | ICD-10-CM

## 2016-09-05 DIAGNOSIS — I10 Essential (primary) hypertension: Secondary | ICD-10-CM | POA: Diagnosis not present

## 2016-09-05 NOTE — Patient Instructions (Signed)

## 2016-09-19 ENCOUNTER — Telehealth: Payer: Self-pay | Admitting: *Deleted

## 2016-09-19 MED ORDER — AMLODIPINE BESYLATE 5 MG PO TABS: 5.0000 mg | ORAL_TABLET | Freq: Every day | ORAL | 3 refills | Status: DC

## 2016-09-19 NOTE — Telephone Encounter (Signed)
Patient was noncompliant with taking amlodipine before and it was removed from his list at last office visit. Patient stated he wants to start taking amlodipine like he is suppose to, especially after his BP was elevated at his office visit. Sent in refill to patient's pharmacy. Encouraged patient to call with any questions or concerns. Patient verbalized understanding.

## 2016-09-19 NOTE — Telephone Encounter (Signed)
Patient called and requested a refill on amlodipine be sent to [facility] drug. This medication is not listed on his current med list, but he states that he is taking it. Please advise. Thanks, MI

## 2016-10-02 DIAGNOSIS — L570 Actinic keratosis: Secondary | ICD-10-CM | POA: Diagnosis not present

## 2016-10-11 ENCOUNTER — Ambulatory Visit (INDEPENDENT_AMBULATORY_CARE_PROVIDER_SITE_OTHER): Payer: Medicare Other

## 2016-10-11 DIAGNOSIS — IMO0001 Reserved for inherently not codable concepts without codable children: Secondary | ICD-10-CM

## 2016-10-11 DIAGNOSIS — T63441D Toxic effect of venom of bees, accidental (unintentional), subsequent encounter: Secondary | ICD-10-CM

## 2016-11-22 ENCOUNTER — Other Ambulatory Visit: Payer: Self-pay | Admitting: Internal Medicine

## 2016-11-23 NOTE — Telephone Encounter (Signed)
Please advise Sir, thank you. 

## 2016-11-23 NOTE — Telephone Encounter (Signed)
Rx faxed to pharmacy  

## 2016-11-23 NOTE — Telephone Encounter (Signed)
OK with 2 refills

## 2016-12-06 ENCOUNTER — Ambulatory Visit (INDEPENDENT_AMBULATORY_CARE_PROVIDER_SITE_OTHER): Payer: Medicare Other | Admitting: *Deleted

## 2016-12-06 DIAGNOSIS — IMO0001 Reserved for inherently not codable concepts without codable children: Secondary | ICD-10-CM

## 2016-12-06 DIAGNOSIS — T63441D Toxic effect of venom of bees, accidental (unintentional), subsequent encounter: Secondary | ICD-10-CM

## 2017-01-01 DIAGNOSIS — Z125 Encounter for screening for malignant neoplasm of prostate: Secondary | ICD-10-CM | POA: Diagnosis not present

## 2017-01-02 DIAGNOSIS — C44311 Basal cell carcinoma of skin of nose: Secondary | ICD-10-CM | POA: Diagnosis not present

## 2017-01-08 DIAGNOSIS — N401 Enlarged prostate with lower urinary tract symptoms: Secondary | ICD-10-CM | POA: Diagnosis not present

## 2017-01-08 DIAGNOSIS — R3911 Hesitancy of micturition: Secondary | ICD-10-CM | POA: Diagnosis not present

## 2017-01-08 DIAGNOSIS — N200 Calculus of kidney: Secondary | ICD-10-CM | POA: Diagnosis not present

## 2017-01-08 DIAGNOSIS — N5201 Erectile dysfunction due to arterial insufficiency: Secondary | ICD-10-CM | POA: Diagnosis not present

## 2017-01-18 ENCOUNTER — Other Ambulatory Visit: Payer: Self-pay

## 2017-01-18 MED ORDER — RABEPRAZOLE SODIUM 20 MG PO TBEC: 20.0000 mg | DELAYED_RELEASE_TABLET | Freq: Every day | ORAL | 1 refills | Status: DC

## 2017-01-29 ENCOUNTER — Other Ambulatory Visit: Payer: Self-pay | Admitting: Cardiovascular Disease

## 2017-01-29 MED ORDER — LOSARTAN POTASSIUM 100 MG PO TABS: ORAL_TABLET | ORAL | 6 refills | Status: DC

## 2017-01-31 ENCOUNTER — Ambulatory Visit (INDEPENDENT_AMBULATORY_CARE_PROVIDER_SITE_OTHER): Payer: Medicare Other | Admitting: *Deleted

## 2017-01-31 DIAGNOSIS — T63441D Toxic effect of venom of bees, accidental (unintentional), subsequent encounter: Secondary | ICD-10-CM

## 2017-01-31 DIAGNOSIS — IMO0001 Reserved for inherently not codable concepts without codable children: Secondary | ICD-10-CM

## 2017-03-12 ENCOUNTER — Ambulatory Visit: Payer: Medicare Other | Admitting: Cardiovascular Disease

## 2017-03-19 ENCOUNTER — Other Ambulatory Visit: Payer: Self-pay

## 2017-03-19 MED ORDER — HYDROCHLOROTHIAZIDE 12.5 MG PO CAPS: 12.5000 mg | ORAL_CAPSULE | Freq: Every day | ORAL | 6 refills | Status: DC

## 2017-03-28 ENCOUNTER — Ambulatory Visit (INDEPENDENT_AMBULATORY_CARE_PROVIDER_SITE_OTHER): Payer: Medicare Other | Admitting: *Deleted

## 2017-03-28 DIAGNOSIS — IMO0001 Reserved for inherently not codable concepts without codable children: Secondary | ICD-10-CM

## 2017-03-28 DIAGNOSIS — T63441D Toxic effect of venom of bees, accidental (unintentional), subsequent encounter: Secondary | ICD-10-CM | POA: Diagnosis not present

## 2017-04-02 DIAGNOSIS — C44311 Basal cell carcinoma of skin of nose: Secondary | ICD-10-CM | POA: Diagnosis not present

## 2017-04-08 DIAGNOSIS — M545 Low back pain: Secondary | ICD-10-CM | POA: Diagnosis not present

## 2017-04-08 DIAGNOSIS — M25551 Pain in right hip: Secondary | ICD-10-CM | POA: Diagnosis not present

## 2017-04-09 ENCOUNTER — Ambulatory Visit (INDEPENDENT_AMBULATORY_CARE_PROVIDER_SITE_OTHER): Payer: Medicare Other | Admitting: Nurse Practitioner

## 2017-04-09 ENCOUNTER — Encounter: Payer: Self-pay | Admitting: Nurse Practitioner

## 2017-04-09 VITALS — BP 155/74 | HR 92 | Ht 70.0 in | Wt 213.4 lb

## 2017-04-09 DIAGNOSIS — I10 Essential (primary) hypertension: Secondary | ICD-10-CM | POA: Diagnosis not present

## 2017-04-09 DIAGNOSIS — E7849 Other hyperlipidemia: Secondary | ICD-10-CM | POA: Diagnosis not present

## 2017-04-09 LAB — LIPID PANEL
Chol/HDL Ratio: 3.4 ratio (ref 0.0–5.0)
Cholesterol, Total: 161 mg/dL (ref 100–199)
HDL: 47 mg/dL (ref >39)
LDL Calculated: 103 mg/dL — ABNORMAL HIGH (ref 0–99)
Triglycerides: 57 mg/dL (ref 0–149)
VLDL Cholesterol Cal: 11 mg/dL (ref 5–40)

## 2017-04-09 LAB — HEPATIC FUNCTION PANEL
ALT: 23 IU/L (ref 0–44)
AST: 14 IU/L (ref 0–40)
Albumin: 4.8 g/dL (ref 3.6–4.8)
Alkaline Phosphatase: 96 IU/L (ref 39–117)
Bilirubin Total: 0.5 mg/dL (ref 0.0–1.2)
Bilirubin, Direct: 0.15 mg/dL (ref 0.00–0.40)
Total Protein: 7.9 g/dL (ref 6.0–8.5)

## 2017-04-09 LAB — CBC
Hematocrit: 44.6 % (ref 37.5–51.0)
Hemoglobin: 15.6 g/dL (ref 13.0–17.7)
MCH: 31.5 pg (ref 26.6–33.0)
MCHC: 35 g/dL (ref 31.5–35.7)
MCV: 90 fL (ref 79–97)
Platelets: 277 10*3/uL (ref 150–379)
RBC: 4.95 x10E6/uL (ref 4.14–5.80)
RDW: 12.8 % (ref 12.3–15.4)
WBC: 12.5 10*3/uL — ABNORMAL HIGH (ref 3.4–10.8)

## 2017-04-09 LAB — BASIC METABOLIC PANEL
BUN/Creatinine Ratio: 14 (ref 10–24)
BUN: 14 mg/dL (ref 8–27)
CO2: 25 mmol/L (ref 20–29)
Calcium: 9.7 mg/dL (ref 8.6–10.2)
Chloride: 92 mmol/L — ABNORMAL LOW (ref 96–106)
Creatinine, Ser: 0.98 mg/dL (ref 0.76–1.27)
GFR calc Af Amer: 91 mL/min/{1.73_m2} (ref >59)
GFR calc non Af Amer: 78 mL/min/{1.73_m2} (ref >59)
Glucose: 285 mg/dL — ABNORMAL HIGH (ref 65–99)
Potassium: 4 mmol/L (ref 3.5–5.2)
Sodium: 134 mmol/L (ref 134–144)

## 2017-04-09 NOTE — Patient Instructions (Addendum)
We will be checking the following labs today - BMET, CBC, HPF and lipids   Medication Instructions:    Continue with your current medicines.   Ok to move the Norvasc to night time  Take the Losartan and HCTZ in the morning    Testing/Procedures To Be Arranged:  N/A  Follow-Up:   See Dr. Johnsie Cancel in 6 months.    Other Special Instructions:   Keep up the good work!!    If you need a refill on your cardiac medications before your next appointment, please call your pharmacy.   Call the [place] office at [phone] if you have any questions, problems or concerns.

## 2017-04-09 NOTE — Progress Notes (Signed)
CARDIOLOGY OFFICE NOTE  Date:  04/09/2017    [name] Date of Birth: 08-17-1945 Medical Record #[id number]  PCP:  Townsend Roger, MD  Cardiologist:  Gillian Shields   Chief Complaint  Patient presents with  . Hypertension  . Palpitations    Seen for Dr. Johnsie Cancel    History of Present Illness: Alex Castillo is a 71 y.o. male who presents today for a 6 month check. Seen for Dr. Johnsie Cancel.   He has a history of HTN, palpitations and 1st degree AV block.   Last seen back in April - felt to be doing ok. Had had hernia surgery that had gone well.   Comes in today. Here alone. He is doing well. He is back playing tennis which he just loves. BP readings from home are pretty good - little high in the early am but otherwise at goal. He is actively losing weight. He has had some chronic back issues - prior surgery with Dr. Vertell Limber - now on some prednisone - just started yesterday for sciatica pain - going to get therapy as well. No chest pain. Breathing is good. Asking about how to take his HTN medicines.   Past Medical History:  Diagnosis Date  . Adenomatous polyps   . Anxiety   . Arthritis   . Asthma   . BPH (benign prostatic hyperplasia)   . Cancer (Echo)    basal cell on nose  . Complication of anesthesia    slow to wake up trouble voiding after a back surgery   . Diverticulosis   . ED (erectile dysfunction)   . Esophageal stricture   . First degree AV block   . GERD (gastroesophageal reflux disease)   . History of kidney stones   . HTN (hypertension)    ETT negative 10/08  . Hymenoptera allergy   . Internal hemorrhoids   . Palpitations   . Staph infection    in right elbow  . Unspecified gastritis and gastroduodenitis without mention of hemorrhage 02/19/2013    Past Surgical History:  Procedure Laterality Date  . bilateral inguinal hernia repair    . COLONOSCOPY W/ BIOPSIES AND POLYPECTOMY  09/15/2008   adonomatous polyps, diverticulosis, internal  hemorrhoids  . CYSTOSCOPY/RETROGRADE/URETEROSCOPY/STONE EXTRACTION WITH BASKET Left 12/08/2012   Procedure: CYSTOSCOPY, LEFT RETROGRADE, LEFT URETEROSCOPY/STONE EXTRACTION WITH BASKET;  Surgeon: Alexis Frock, MD;  Location: [facility];  Service: Urology;  Laterality: Left;  . ESOPHAGOGASTRODUODENOSCOPY  02/19/2013  . INGUINAL HERNIA REPAIR Left 07/25/2016   Procedure: LAPAROSCOPIC REPAIR RECURRENT LEFT INGUINAL HERNIA WITH MESH POSSIBLE OPEN;  Surgeon: Fanny Skates, MD;  Location: [facility];  Service: General;  Laterality: Left;  . INSERTION OF MESH Left 07/25/2016   Procedure: INSERTION OF MESH;  Surgeon: Fanny Skates, MD;  Location: [facility];  Service: General;  Laterality: Left;  . lower back surgery with lumbar vertebral repair    . nasl septoplasty    . right foot surgery    . UPPER GASTROINTESTINAL ENDOSCOPY  02/07/2010   w/dil, tourtuous esophagus, hiatal hernia     Medications: Current Meds  Medication Sig  . ALPRAZolam (XANAX) 0.25 MG tablet TAKE 1 TABLET AT BEDTIME AS NEEDED FOR SLEEP  . amLODipine (NORVASC) 5 MG tablet Take 1 tablet (5 mg total) by mouth daily.  [name] aspirin 81 MG tablet Take 81 mg by mouth daily.    [name] b complex vitamins tablet Take 1 tablet by mouth daily.  . celecoxib (CELEBREX) 200 MG  capsule Take 200 mg by mouth daily as needed for mild pain or moderate pain.  . Cholecalciferol (VITAMIN D3) 5000 units CAPS Take 5,000 Units by mouth daily.  . Coenzyme Q10 (COQ-10 PO) Take 1 capsule by mouth daily.  [name] desloratadine (CLARINEX) 5 MG tablet Take 5 mg by mouth daily as needed (ALLERGIES).   [name] EPINEPHrine 0.3 mg/0.3 mL IJ SOAJ injection Use as directed for life-threatening allergic reaction.  . hydrochlorothiazide (MICROZIDE) 12.5 MG capsule Take 1 capsule (12.5 mg total) daily by mouth.  . losartan (COZAAR) 100 MG tablet TAKE ONE (1) TABLET BY MOUTH EVERY DAY  . Omega-3 Fatty Acids (FISH OIL) 1200 MG CAPS Take 1,200 mg by mouth daily.  . predniSONE (DELTASONE) 10 MG  tablet Take 10 mg by mouth as directed.   . RABEprazole (ACIPHEX) 20 MG tablet Take 1 tablet (20 mg total) by mouth daily.  . sildenafil (REVATIO) 20 MG tablet Take 20 mg by mouth daily as needed (ED).  . Triamcinolone Acetonide (NASACORT ALLERGY 24HR NA) Place 2 sprays into the nose daily as needed (allergies).   . vitamin E 400 UNIT capsule Take 400 Units by mouth daily.     Allergies: Allergies  Allergen Reactions  . Bee Venom Anaphylaxis  . Hydromorphone Hcl Nausea And Vomiting    Social History: The patient  reports that  has never smoked. he has never used smokeless tobacco. He reports that he does not drink alcohol or use drugs.   Family History: The patient's family history includes Arthritis in his father; Asthma in his maternal grandfather; Heart disease in his father; Hypertension in his mother; Liver disease in his daughter; Lung cancer in his father.   Review of Systems: Please see the history of present illness.   Otherwise, the review of systems is positive for none.   All other systems are reviewed and negative.   Physical Exam: VS:  BP (!) 155/74   Pulse 92   Ht 5\' 10"  (1.778 m)   Wt 213 lb 6.4 oz (96.8 kg)   SpO2 93%   BMI 30.62 kg/m  .  BMI Body mass index is 30.62 kg/m.  Wt Readings from Last 3 Encounters:  04/09/17 213 lb 6.4 oz (96.8 kg)  09/05/16 229 lb (103.9 kg)  07/25/16 229 lb (103.9 kg)   BP is 130/70 by me today.  General: Pleasant. Quite talkative. Alert and in no acute distress. He has lost 16 pounds.   HEENT: Normal.  Neck: Supple, no JVD, carotid bruits, or masses noted.  Cardiac: Regular rate and rhythm. No murmurs, rubs, or gallops. No edema.  Respiratory:  Lungs are clear to auscultation bilaterally with normal work of breathing.  GI: Soft and nontender.  MS: No deformity or atrophy. Gait and ROM intact.  Skin: Warm and dry. Color is normal.  Neuro:  Strength and sensation are intact and no gross focal deficits noted.  Psych:  Alert, appropriate and with normal affect.   LABORATORY DATA:  EKG:  EKG is not ordered today.  Lab Results  Component Value Date   WBC 7.0 07/23/2016   HGB 14.9 07/23/2016   HCT 42.6 07/23/2016   PLT 186 07/23/2016   GLUCOSE 223 (H) 07/23/2016   CHOL 167 09/28/2009   TRIG 93.0 09/28/2009   HDL 34.80 (L) 09/28/2009   LDLCALC 114 (H) 09/28/2009   ALT 36 07/23/2016   AST 30 07/23/2016   NA 135 07/23/2016   K 3.8 07/23/2016   CL 100 (L)  07/23/2016   CREATININE 0.84 07/23/2016   BUN 14 07/23/2016   CO2 27 07/23/2016       BNP (last 3 results) No results for input(s): BNP in the last 8760 hours.  ProBNP (last 3 results) No results for input(s): PROBNP in the last 8760 hours.   Other Studies Reviewed Today:   Assessment/Plan:  1. Palpitations - not an issue  2. HTN - BP recheck by me is fine. His readings from home for the most part are excellent  3. Obesity - actively losing weight and exercising  4. Sciatica  5. History of 1st degree AV block - EKG's annually - not noted on last EKG from April of 2018. Would follow - no symptoms noted.   Current medicines are reviewed with the patient today.  The patient does not have concerns regarding medicines other than what has been noted above.  The following changes have been made:  See above.  Labs/ tests ordered today include:   No orders of the defined types were placed in this encounter.    Disposition:   FU with Dr. Johnsie Cancel in 6 months with EKG  Patient is agreeable to this plan and will call if any problems develop in the interim.   SignedTruitt Merle, NP  04/09/2017 10:52 AM  [facility] [address] [place], [place]  [address] Phone: [phone] Fax: [phone]

## 2017-04-11 DIAGNOSIS — M25551 Pain in right hip: Secondary | ICD-10-CM | POA: Diagnosis not present

## 2017-04-11 DIAGNOSIS — R2689 Other abnormalities of gait and mobility: Secondary | ICD-10-CM | POA: Diagnosis not present

## 2017-04-11 DIAGNOSIS — M6281 Muscle weakness (generalized): Secondary | ICD-10-CM | POA: Diagnosis not present

## 2017-04-15 DIAGNOSIS — R2689 Other abnormalities of gait and mobility: Secondary | ICD-10-CM | POA: Diagnosis not present

## 2017-04-15 DIAGNOSIS — M6281 Muscle weakness (generalized): Secondary | ICD-10-CM | POA: Diagnosis not present

## 2017-04-15 DIAGNOSIS — M25552 Pain in left hip: Secondary | ICD-10-CM | POA: Diagnosis not present

## 2017-04-17 DIAGNOSIS — M6281 Muscle weakness (generalized): Secondary | ICD-10-CM | POA: Diagnosis not present

## 2017-04-17 DIAGNOSIS — R2689 Other abnormalities of gait and mobility: Secondary | ICD-10-CM | POA: Diagnosis not present

## 2017-04-17 DIAGNOSIS — M25552 Pain in left hip: Secondary | ICD-10-CM | POA: Diagnosis not present

## 2017-04-23 DIAGNOSIS — R2689 Other abnormalities of gait and mobility: Secondary | ICD-10-CM | POA: Diagnosis not present

## 2017-04-23 DIAGNOSIS — M6281 Muscle weakness (generalized): Secondary | ICD-10-CM | POA: Diagnosis not present

## 2017-04-23 DIAGNOSIS — M25552 Pain in left hip: Secondary | ICD-10-CM | POA: Diagnosis not present

## 2017-04-25 DIAGNOSIS — M6281 Muscle weakness (generalized): Secondary | ICD-10-CM | POA: Diagnosis not present

## 2017-04-25 DIAGNOSIS — R2689 Other abnormalities of gait and mobility: Secondary | ICD-10-CM | POA: Diagnosis not present

## 2017-04-25 DIAGNOSIS — M25552 Pain in left hip: Secondary | ICD-10-CM | POA: Diagnosis not present

## 2017-04-29 DIAGNOSIS — M25511 Pain in right shoulder: Secondary | ICD-10-CM | POA: Diagnosis not present

## 2017-05-02 DIAGNOSIS — M25552 Pain in left hip: Secondary | ICD-10-CM | POA: Diagnosis not present

## 2017-05-02 DIAGNOSIS — R2689 Other abnormalities of gait and mobility: Secondary | ICD-10-CM | POA: Diagnosis not present

## 2017-05-02 DIAGNOSIS — M6281 Muscle weakness (generalized): Secondary | ICD-10-CM | POA: Diagnosis not present

## 2017-05-15 DIAGNOSIS — M25552 Pain in left hip: Secondary | ICD-10-CM | POA: Diagnosis not present

## 2017-05-15 DIAGNOSIS — M6281 Muscle weakness (generalized): Secondary | ICD-10-CM | POA: Diagnosis not present

## 2017-05-15 DIAGNOSIS — R2689 Other abnormalities of gait and mobility: Secondary | ICD-10-CM | POA: Diagnosis not present

## 2017-05-17 DIAGNOSIS — M25552 Pain in left hip: Secondary | ICD-10-CM | POA: Diagnosis not present

## 2017-05-17 DIAGNOSIS — R2689 Other abnormalities of gait and mobility: Secondary | ICD-10-CM | POA: Diagnosis not present

## 2017-05-17 DIAGNOSIS — M6281 Muscle weakness (generalized): Secondary | ICD-10-CM | POA: Diagnosis not present

## 2017-05-20 DIAGNOSIS — R2689 Other abnormalities of gait and mobility: Secondary | ICD-10-CM | POA: Diagnosis not present

## 2017-05-20 DIAGNOSIS — M6281 Muscle weakness (generalized): Secondary | ICD-10-CM | POA: Diagnosis not present

## 2017-05-20 DIAGNOSIS — M25552 Pain in left hip: Secondary | ICD-10-CM | POA: Diagnosis not present

## 2017-05-22 DIAGNOSIS — M25552 Pain in left hip: Secondary | ICD-10-CM | POA: Diagnosis not present

## 2017-05-22 DIAGNOSIS — M6281 Muscle weakness (generalized): Secondary | ICD-10-CM | POA: Diagnosis not present

## 2017-05-22 DIAGNOSIS — R2689 Other abnormalities of gait and mobility: Secondary | ICD-10-CM | POA: Diagnosis not present

## 2017-05-23 ENCOUNTER — Ambulatory Visit (INDEPENDENT_AMBULATORY_CARE_PROVIDER_SITE_OTHER): Payer: Medicare Other | Admitting: *Deleted

## 2017-05-23 DIAGNOSIS — IMO0001 Reserved for inherently not codable concepts without codable children: Secondary | ICD-10-CM

## 2017-05-23 DIAGNOSIS — T63441D Toxic effect of venom of bees, accidental (unintentional), subsequent encounter: Secondary | ICD-10-CM | POA: Diagnosis not present

## 2017-05-27 DIAGNOSIS — M25552 Pain in left hip: Secondary | ICD-10-CM | POA: Diagnosis not present

## 2017-05-27 DIAGNOSIS — R2689 Other abnormalities of gait and mobility: Secondary | ICD-10-CM | POA: Diagnosis not present

## 2017-05-27 DIAGNOSIS — M6281 Muscle weakness (generalized): Secondary | ICD-10-CM | POA: Diagnosis not present

## 2017-05-30 DIAGNOSIS — Z23 Encounter for immunization: Secondary | ICD-10-CM | POA: Diagnosis not present

## 2017-05-30 DIAGNOSIS — M6281 Muscle weakness (generalized): Secondary | ICD-10-CM | POA: Diagnosis not present

## 2017-05-30 DIAGNOSIS — M25552 Pain in left hip: Secondary | ICD-10-CM | POA: Diagnosis not present

## 2017-05-30 DIAGNOSIS — R2689 Other abnormalities of gait and mobility: Secondary | ICD-10-CM | POA: Diagnosis not present

## 2017-06-04 DIAGNOSIS — M6281 Muscle weakness (generalized): Secondary | ICD-10-CM | POA: Diagnosis not present

## 2017-06-04 DIAGNOSIS — R2689 Other abnormalities of gait and mobility: Secondary | ICD-10-CM | POA: Diagnosis not present

## 2017-06-04 DIAGNOSIS — M25552 Pain in left hip: Secondary | ICD-10-CM | POA: Diagnosis not present

## 2017-06-06 DIAGNOSIS — M6281 Muscle weakness (generalized): Secondary | ICD-10-CM | POA: Diagnosis not present

## 2017-06-06 DIAGNOSIS — R2689 Other abnormalities of gait and mobility: Secondary | ICD-10-CM | POA: Diagnosis not present

## 2017-06-06 DIAGNOSIS — M25552 Pain in left hip: Secondary | ICD-10-CM | POA: Diagnosis not present

## 2017-06-18 DIAGNOSIS — L570 Actinic keratosis: Secondary | ICD-10-CM | POA: Diagnosis not present

## 2017-06-18 DIAGNOSIS — D1801 Hemangioma of skin and subcutaneous tissue: Secondary | ICD-10-CM | POA: Diagnosis not present

## 2017-06-27 ENCOUNTER — Telehealth: Payer: Self-pay

## 2017-06-27 NOTE — Telephone Encounter (Signed)
Patients rabeprazole has been approved thru 06/27/2018.

## 2017-06-27 NOTE — Telephone Encounter (Signed)
Did prior authorization thru CoverMyMeds for patients Rabeprazole 20mg  tablets , takes one daily for GERD K21.9. Will await outcome.

## 2017-07-18 ENCOUNTER — Ambulatory Visit (INDEPENDENT_AMBULATORY_CARE_PROVIDER_SITE_OTHER): Payer: Medicare Other | Admitting: *Deleted

## 2017-07-18 DIAGNOSIS — R1032 Left lower quadrant pain: Secondary | ICD-10-CM | POA: Diagnosis not present

## 2017-07-18 DIAGNOSIS — F419 Anxiety disorder, unspecified: Secondary | ICD-10-CM | POA: Diagnosis not present

## 2017-07-18 DIAGNOSIS — T63441D Toxic effect of venom of bees, accidental (unintentional), subsequent encounter: Secondary | ICD-10-CM

## 2017-07-18 DIAGNOSIS — IMO0001 Reserved for inherently not codable concepts without codable children: Secondary | ICD-10-CM

## 2017-07-18 DIAGNOSIS — Z6833 Body mass index (BMI) 33.0-33.9, adult: Secondary | ICD-10-CM | POA: Diagnosis not present

## 2017-07-18 DIAGNOSIS — K4091 Unilateral inguinal hernia, without obstruction or gangrene, recurrent: Secondary | ICD-10-CM | POA: Diagnosis not present

## 2017-08-02 ENCOUNTER — Other Ambulatory Visit: Payer: Self-pay

## 2017-08-02 MED ORDER — LOSARTAN POTASSIUM 100 MG PO TABS: ORAL_TABLET | ORAL | 0 refills | Status: DC

## 2017-08-02 NOTE — Telephone Encounter (Signed)
Forwarded to [employer] [address]

## 2017-08-23 ENCOUNTER — Encounter: Payer: Self-pay | Admitting: Physician Assistant

## 2017-08-23 ENCOUNTER — Ambulatory Visit (INDEPENDENT_AMBULATORY_CARE_PROVIDER_SITE_OTHER): Payer: Medicare Other | Admitting: Physician Assistant

## 2017-08-23 VITALS — BP 126/82 | HR 68 | Ht 70.0 in | Wt 217.4 lb

## 2017-08-23 DIAGNOSIS — K5900 Constipation, unspecified: Secondary | ICD-10-CM | POA: Diagnosis not present

## 2017-08-23 DIAGNOSIS — K219 Gastro-esophageal reflux disease without esophagitis: Secondary | ICD-10-CM

## 2017-08-23 DIAGNOSIS — R6881 Early satiety: Secondary | ICD-10-CM

## 2017-08-23 MED ORDER — RABEPRAZOLE SODIUM 20 MG PO TBEC: 20.0000 mg | DELAYED_RELEASE_TABLET | Freq: Every day | ORAL | 1 refills | Status: DC

## 2017-08-23 MED ORDER — ALPRAZOLAM 0.25 MG PO TABS: 0.2500 mg | ORAL_TABLET | Freq: Every evening | ORAL | 2 refills | Status: DC | PRN

## 2017-08-23 NOTE — Patient Instructions (Addendum)
If you are age 70 or older, your body mass index should be between 23-30. Your Body mass index is 31.19 kg/m. If this is out of the aforementioned range listed, please consider follow up with your Primary Care Provider.  If you are age 34 or younger, your body mass index should be between 19-25. Your Body mass index is 31.19 kg/m. If this is out of the aformentioned range listed, please consider follow up with your Primary Care Provider.   You have been scheduled for a Barium Esophogram at [facility] (1st floor of the hospital) on 09/06/17 at 9:30 am. Please arrive 15 minutes prior to your appointment for registration. Make certain not to have anything to eat or drink 3 hours prior to your test. If you need to reschedule for any reason, please contact radiology at [phone] to do so. __________________________________________________________________ A barium swallow is an examination that concentrates on views of the esophagus. This tends to be a double contrast exam (barium and two liquids which, when combined, create a gas to distend the wall of the oesophagus) or single contrast (non-ionic iodine based). The study is usually tailored to your symptoms so a good history is essential. Attention is paid during the study to the form, structure and configuration of the esophagus, looking for functional disorders (such as aspiration, dysphagia, achalasia, motility and reflux) EXAMINATION You may be asked to change into a gown, depending on the type of swallow being performed. A radiologist and radiographer will perform the procedure. The radiologist will advise you of the type of contrast selected for your procedure and direct you during the exam. You will be asked to stand, sit or lie in several different positions and to hold a small amount of fluid in your mouth before being asked to swallow while the imaging is performed .In some instances you may be asked to swallow barium coated  marshmallows to assess the motility of a solid food bolus. The exam can be recorded as a digital or video fluoroscopy procedure. POST PROCEDURE It will take 1-2 days for the barium to pass through your system. To facilitate this, it is important, unless otherwise directed, to increase your fluids for the next 24-48hrs and to resume your normal diet.  This test typically takes about 30 minutes to perform. _________________________________________________________________________ Continue Aciphex.  Increase water to 8  8 ounce glasses daily.  Follow up appointment with Ellouise Newer, PA-C on Oct 02, 2017 at 11:15 am.  Thank you for choosing me and [facility].   Ellouise Newer, PA-C

## 2017-08-23 NOTE — Progress Notes (Addendum)
Chief Complaint: Early satiety, GERD, constipation  HPI:    Alex Castillo is a 72 year old Caucasian male with a past medical history as listed below, who follows with Dr. Carlean Purl and presents to clinic today with a complaint of early satiety, chronic GERD and constipation.    04/23/16 office visit Dr. Carlean Purl for GERD.  Continued on PPI, AcipHex and low-dose alprazolam for anxiety.    12/02/13 colonoscopy with one polyp and moderate diverticulosis.  Repeat recommended in 5 years.  02/19/13 EGD with mild esophageal inflammation.    Today, explains that over the past few months he has noticed that he can eat not much food and feel full.  Tthis does not normally bother him, he just does not eat as much, but when he goes to play tennis he has to wait a couple of hours after eating in order to be able to do so.  If he does eat too large of a meal, this tends to increase his reflux and sometimes he will have some regurgitation.  Continues on AcipHex for his reflux which seems to control this well.    Also has become slightly more constipated over the past 2 months.  Explains he had a left inguinal hernia repair a year or so ago and wonders if this could be related.  Describes being on fiber supplement chronically, but admits to not drinking a lot of water.  Trouble initiating a bowel movement, but once he gets started it is fine. BM every day.  Does tell me he was started on a new blood pressure medicine which is "drying him out".    Denies fever, chills, blood in stool, melena, weight loss, anorexia, nausea, vomiting or symptoms that awaken him at night.  Past Medical History:  Diagnosis Date  . Adenomatous polyps   . Anxiety   . Arthritis   . Asthma   . BPH (benign prostatic hyperplasia)   . Cancer ([place])    basal cell on nose  . Complication of anesthesia    slow to wake up trouble voiding after a back surgery   . Diverticulosis   . ED (erectile dysfunction)   . Esophageal stricture   . First  degree AV block   . GERD (gastroesophageal reflux disease)   . History of kidney stones   . HTN (hypertension)    ETT negative 10/08  . Hymenoptera allergy   . Internal hemorrhoids   . Palpitations   . Staph infection    in right elbow  . Unspecified gastritis and gastroduodenitis without mention of hemorrhage 02/19/2013    Past Surgical History:  Procedure Laterality Date  . bilateral inguinal hernia repair    . COLONOSCOPY W/ BIOPSIES AND POLYPECTOMY  09/15/2008   adonomatous polyps, diverticulosis, internal hemorrhoids  . CYSTOSCOPY/RETROGRADE/URETEROSCOPY/STONE EXTRACTION WITH BASKET Left 12/08/2012   Procedure: CYSTOSCOPY, LEFT RETROGRADE, LEFT URETEROSCOPY/STONE EXTRACTION WITH BASKET;  Surgeon: Alexis Frock, MD;  Location: [facility];  Service: Urology;  Laterality: Left;  . ESOPHAGOGASTRODUODENOSCOPY  02/19/2013  . INGUINAL HERNIA REPAIR Left 07/25/2016   Procedure: LAPAROSCOPIC REPAIR RECURRENT LEFT INGUINAL HERNIA WITH MESH POSSIBLE OPEN;  Surgeon: Fanny Skates, MD;  Location: [facility];  Service: General;  Laterality: Left;  . INSERTION OF MESH Left 07/25/2016   Procedure: INSERTION OF MESH;  Surgeon: Fanny Skates, MD;  Location: [facility];  Service: General;  Laterality: Left;  . lower back surgery with lumbar vertebral repair    . nasl septoplasty    . right foot surgery    .  UPPER GASTROINTESTINAL ENDOSCOPY  02/07/2010   w/dil, tourtuous esophagus, hiatal hernia    Current Outpatient Medications  Medication Sig Dispense Refill  . ALPRAZolam (XANAX) 0.25 MG tablet TAKE 1 TABLET AT BEDTIME AS NEEDED FOR SLEEP 30 tablet 2  . amLODipine (NORVASC) 5 MG tablet Take 1 tablet (5 mg total) by mouth daily. 90 tablet 3  . aspirin 81 MG tablet Take 81 mg by mouth daily.      [name] b complex vitamins tablet Take 1 tablet by mouth daily.    . Cholecalciferol (VITAMIN D3) 5000 units CAPS Take 5,000 Units by mouth daily.    . Coenzyme Q10 (COQ-10 PO) Take 1 capsule by mouth daily.    [name]  desloratadine (CLARINEX) 5 MG tablet Take 5 mg by mouth daily as needed (ALLERGIES).     [name] EPINEPHrine 0.3 mg/0.3 mL IJ SOAJ injection Use as directed for life-threatening allergic reaction. 2 Device 3  . hydrochlorothiazide (MICROZIDE) 12.5 MG capsule Take 1 capsule (12.5 mg total) daily by mouth. 30 capsule 6  . losartan (COZAAR) 100 MG tablet TAKE ONE (1) TABLET BY MOUTH EVERY DAY. Call and schedule follow up office visit to receive further refills. Thank you. [phone] 90 tablet 0  . Omega-3 Fatty Acids (FISH OIL) 1200 MG CAPS Take 1,200 mg by mouth daily.    . RABEprazole (ACIPHEX) 20 MG tablet Take 1 tablet (20 mg total) by mouth daily. 90 tablet 1  . sildenafil (REVATIO) 20 MG tablet Take 20 mg by mouth daily as needed (ED).    . Triamcinolone Acetonide (NASACORT ALLERGY 24HR NA) Place 2 sprays into the nose daily as needed (allergies).     . vitamin E 400 UNIT capsule Take 400 Units by mouth daily.     No current facility-administered medications for this visit.     Allergies as of 08/23/2017 - Review Complete 08/23/2017  Allergen Reaction Noted  . Bee venom Anaphylaxis 09/24/2012  . Hydromorphone hcl Nausea And Vomiting     Family History  Problem Relation Age of Onset  . Lung cancer Father   . Heart disease Father   . Arthritis Father   . Hypertension Mother   . Asthma Maternal Grandfather   . Liver disease Daughter        Primary Sclerosisng cholangitis  . Colon cancer Neg Hx   . Throat cancer Neg Hx   . Kidney disease Neg Hx     Social History   Socioeconomic History  . Marital status: Married    Spouse name: Not on file  . Number of children: 2  . Years of education: Not on file  . Highest education level: Not on file  Occupational History  . Occupation: Retired  Scientific laboratory technician  . Financial resource strain: Not on file  . Food insecurity:    Worry: Not on file    Inability: Not on file  . Transportation needs:    Medical: Not on file    Non-medical: Not  on file  Tobacco Use  . Smoking status: Never Smoker  . Smokeless tobacco: Never Used  Substance and Sexual Activity  . Alcohol use: No  . Drug use: No  . Sexual activity: Yes  Lifestyle  . Physical activity:    Days per week: Not on file    Minutes per session: Not on file  . Stress: Not on file  Relationships  . Social connections:    Talks on phone: Not on file    Gets together:  Not on file    Attends religious service: Not on file    Active member of club or organization: Not on file    Attends meetings of clubs or organizations: Not on file    Relationship status: Not on file  . Intimate partner violence:    Fear of current or ex partner: Not on file    Emotionally abused: Not on file    Physically abused: Not on file    Forced sexual activity: Not on file  Other Topics Concern  . Not on file  Social History Narrative   Daily Caffeine use sodas and tea    Review of Systems:    Constitutional: No weight loss, fever or chills Cardiovascular: No chest pain Respiratory: No SOB  Gastrointestinal: See HPI and otherwise negative   Physical Exam:  Vital signs: BP 126/82   Pulse 68   Ht 5\' 10"  (1.778 m)   Wt 217 lb 6 oz (98.6 kg)   BMI 31.19 kg/m   Constitutional:   Pleasant Caucasian male appears to be in NAD, Well developed, Well nourished, alert and cooperative Respiratory: Respirations even and unlabored. Lungs clear to auscultation bilaterally.   No wheezes, crackles, or rhonchi.  Cardiovascular: Normal S1, S2. No MRG. Regular rate and rhythm. No peripheral edema, cyanosis or pallor.  Gastrointestinal:  Soft, nondistended, mild epigastric ttp. No rebound or guarding. Normal bowel sounds. No appreciable masses or hepatomegaly. Psychiatric: Demonstrates good judgement and reason without abnormal affect or behaviors.  No recent labs or imaging.  Assessment: 1.  GERD: Controlled on AcipHex daily 2.  Early satiety: Over the past few months, wonders if this could  be related to a stricture 3.  Constipation: Patient describes slowing of his stools/trouble initiating bowel movements on a daily basis, admits to new medication which is "drying him up", also admits to not drinking enough water  Plan: 1.  Discussed that patient does not have a known history of Barrett's esophagus or any other reason for surveillance EGDs.  He does not describe dysphagia today but more of a feeling of early satiety.  Explained that we could start with an upper GI/barium esophagram with tablet today for further evaluation.  If this shows abnormality such as stricture or other, would consider an EGD. 2.  Continue AcipHex. 3.  Reviewed antireflux diet and lifestyle modifications. 4. Refilled Aciphex and Alprazolam (this had been previously refilled by Dr. Carlean Purl) 5.  Continue fiber supplementation.  Encouraged patient to increase water intake to at least 8 eight ounce glasses of water per day.  If this does not regulate his stools could consider colonoscopy. 6.  Patient to follow in clinic with me or Dr. Carlean Purl in 4-6 weeks.  Alex Newer, PA-C [facility] 08/23/2017, 10:20 AM  Cc: Alex Roger, MD   Agree with Ms. Alex Castillo's evaluation and management.  I did also call him - he decided not to do Ba swallow He has a long hx functional upper GI sxs He is under some stress as daughter has Gilbert and he admittedly worries about her some - she has had angioedema of lips lately it sounds like  He is a bit better eating 1 apple/day and some intermittent Colace  I told him if he is not sig better when he comes for f/u would set up EGD/colonoscopy to eval early satiety and change in bowel habits  Alex Mayer, MD, [facility]

## 2017-09-02 ENCOUNTER — Telehealth: Payer: Self-pay | Admitting: Physician Assistant

## 2017-09-02 NOTE — Telephone Encounter (Signed)
Patient states he wants to cancel barium swallow scheduled for 4.26.19 due to focusing on the lower gi issue at this time.

## 2017-09-02 NOTE — Telephone Encounter (Signed)
The pt is aware that the barium swallow will be cancelled per pt request

## 2017-09-02 NOTE — Telephone Encounter (Signed)
Anderson Malta FYI: please advise if this is ok?

## 2017-09-02 NOTE — Telephone Encounter (Signed)
That is ok if he does not wish to pursue it any further. Thanks-JLL

## 2017-09-06 ENCOUNTER — Ambulatory Visit (HOSPITAL_COMMUNITY): Payer: BLUE CROSS/BLUE SHIELD

## 2017-09-09 ENCOUNTER — Other Ambulatory Visit: Payer: Self-pay | Admitting: *Deleted

## 2017-09-09 MED ORDER — LOSARTAN POTASSIUM 100 MG PO TABS: ORAL_TABLET | ORAL | 5 refills | Status: DC

## 2017-09-12 ENCOUNTER — Ambulatory Visit (INDEPENDENT_AMBULATORY_CARE_PROVIDER_SITE_OTHER): Payer: Medicare Other | Admitting: *Deleted

## 2017-09-12 DIAGNOSIS — T63441D Toxic effect of venom of bees, accidental (unintentional), subsequent encounter: Secondary | ICD-10-CM | POA: Diagnosis not present

## 2017-09-12 DIAGNOSIS — IMO0001 Reserved for inherently not codable concepts without codable children: Secondary | ICD-10-CM

## 2017-10-02 ENCOUNTER — Ambulatory Visit: Payer: BLUE CROSS/BLUE SHIELD | Admitting: Physician Assistant

## 2017-10-16 ENCOUNTER — Ambulatory Visit: Payer: BLUE CROSS/BLUE SHIELD | Admitting: Cardiovascular Disease

## 2017-10-25 DIAGNOSIS — L570 Actinic keratosis: Secondary | ICD-10-CM | POA: Diagnosis not present

## 2017-11-07 ENCOUNTER — Ambulatory Visit (INDEPENDENT_AMBULATORY_CARE_PROVIDER_SITE_OTHER): Payer: Medicare Other | Admitting: *Deleted

## 2017-11-07 DIAGNOSIS — IMO0001 Reserved for inherently not codable concepts without codable children: Secondary | ICD-10-CM

## 2017-11-07 DIAGNOSIS — T63441D Toxic effect of venom of bees, accidental (unintentional), subsequent encounter: Secondary | ICD-10-CM | POA: Diagnosis not present

## 2017-11-08 DIAGNOSIS — J029 Acute pharyngitis, unspecified: Secondary | ICD-10-CM | POA: Diagnosis not present

## 2017-11-18 NOTE — Progress Notes (Signed)
Cardiology Office Note   Date:  11/19/2017   ID:  Alex Castillo, Alex Castillo November 20, 1945, MRN [id number]  PCP:  Townsend Roger, MD  Cardiologist:  Dr. Johnsie Cancel    Chief Complaint  Patient presents with  . Hypertension    Essential Hypertension      History of Present Illness: Alex Castillo is a 72 y.o. male who presents for HTN, and palpitations.  Last seen 03/2017 by Truitt Merle, NP,    Pt is active with chronic back issues.  Hx of 1st degree AV block.   Today.he feel great, no chest pain and no SOB.  No lightheadedness no palpitations.  He plays tennis freq.  Years ago he had 3 episodes of rapid HR and none since.  He is allergic to bees and had episode of arrest with initial sting.  Now carries epi pen.  His BP is elevated today but on list from home his BPs are 129/72 to 141/74.    We discussed we are not related but his father is from same hometown my father is from.      Past Medical History:  Diagnosis Date  . Adenomatous polyps   . Anxiety   . Arthritis   . Asthma   . BPH (benign prostatic hyperplasia)   . Cancer ([place])    basal cell on nose  . Complication of anesthesia    slow to wake up trouble voiding after a back surgery   . Diverticulosis   . ED (erectile dysfunction)   . Esophageal stricture   . First degree AV block   . GERD (gastroesophageal reflux disease)   . History of kidney stones   . HTN (hypertension)    ETT negative 10/08  . Hymenoptera allergy   . Internal hemorrhoids   . Palpitations   . Staph infection    in right elbow  . Unspecified gastritis and gastroduodenitis without mention of hemorrhage 02/19/2013    Past Surgical History:  Procedure Laterality Date  . bilateral inguinal hernia repair    . COLONOSCOPY W/ BIOPSIES AND POLYPECTOMY  09/15/2008   adonomatous polyps, diverticulosis, internal hemorrhoids  . CYSTOSCOPY/RETROGRADE/URETEROSCOPY/STONE EXTRACTION WITH BASKET Left 12/08/2012   Procedure: CYSTOSCOPY, LEFT RETROGRADE,  LEFT URETEROSCOPY/STONE EXTRACTION WITH BASKET;  Surgeon: Alexis Frock, MD;  Location: [facility];  Service: Urology;  Laterality: Left;  . ESOPHAGOGASTRODUODENOSCOPY  02/19/2013  . INGUINAL HERNIA REPAIR Left 07/25/2016   Procedure: LAPAROSCOPIC REPAIR RECURRENT LEFT INGUINAL HERNIA WITH MESH POSSIBLE OPEN;  Surgeon: Fanny Skates, MD;  Location: [facility];  Service: General;  Laterality: Left;  . INSERTION OF MESH Left 07/25/2016   Procedure: INSERTION OF MESH;  Surgeon: Fanny Skates, MD;  Location: [facility];  Service: General;  Laterality: Left;  . lower back surgery with lumbar vertebral repair    . nasl septoplasty    . right foot surgery    . UPPER GASTROINTESTINAL ENDOSCOPY  02/07/2010   w/dil, tourtuous esophagus, hiatal hernia     Current Outpatient Medications  Medication Sig Dispense Refill  . ALPRAZolam (XANAX) 0.25 MG tablet Take 1 tablet (0.25 mg total) by mouth at bedtime as needed. for sleep 30 tablet 2  . amLODipine (NORVASC) 5 MG tablet Take 1 tablet (5 mg total) by mouth daily. 90 tablet 3  . aspirin 81 MG tablet Take 81 mg by mouth daily.      [name] b complex vitamins tablet Take 1 tablet by mouth daily.    . Cholecalciferol (VITAMIN D3) 5000 units  CAPS Take 5,000 Units by mouth daily.    . Coenzyme Q10 (COQ-10 PO) Take 1 capsule by mouth daily.    [name] desloratadine (CLARINEX) 5 MG tablet Take 5 mg by mouth daily as needed (ALLERGIES).     [name] EPINEPHrine 0.3 mg/0.3 mL IJ SOAJ injection Use as directed for life-threatening allergic reaction. 2 Device 3  . hydrochlorothiazide (MICROZIDE) 12.5 MG capsule Take 1 capsule (12.5 mg total) daily by mouth. 30 capsule 6  . losartan (COZAAR) 100 MG tablet TAKE ONE (1) TABLET BY MOUTH EVERY DAY. 30 tablet 5  . Omega-3 Fatty Acids (FISH OIL) 1200 MG CAPS Take 1,200 mg by mouth daily.    . RABEprazole (ACIPHEX) 20 MG tablet Take 1 tablet (20 mg total) by mouth daily. 90 tablet 1  . tadalafil (CIALIS) 5 MG tablet Take 5 mg by mouth daily.    .  Triamcinolone Acetonide (NASACORT ALLERGY 24HR NA) Place 2 sprays into the nose daily as needed (allergies).     . vitamin E 400 UNIT capsule Take 400 Units by mouth daily.     No current facility-administered medications for this visit.     Allergies:   Bee venom and Hydromorphone hcl    Social History:  The patient  reports that he has never smoked. He has never used smokeless tobacco. He reports that he does not drink alcohol or use drugs.   Family History:  The patient's family history includes Arthritis in his father; Asthma in his maternal grandfather; Heart disease in his father; Hypertension in his mother; Liver disease in his daughter; Lung cancer in his father.    ROS:  General:no colds or fevers, no weight changes Skin:no rashes or ulcers HEENT:no blurred vision, no congestion CV:see HPI PUL:see HPI GI:no diarrhea constipation or melena, no indigestion GU:no hematuria, no dysuria MS:no joint pain, no claudication Neuro:no syncope, no lightheadedness Endo:no diabetes, no thyroid disease  Wt Readings from Last 3 Encounters:  11/19/17 213 lb 12.8 oz (97 kg)  08/23/17 217 lb 6 oz (98.6 kg)  04/09/17 213 lb 6.4 oz (96.8 kg)     PHYSICAL EXAM: VS:  BP (!) 150/88   Pulse 77   Ht 5\' 10"  (1.778 m)   Wt 213 lb 12.8 oz (97 kg)   SpO2 96%   BMI 30.68 kg/m  , BMI Body mass index is 30.68 kg/m. General:Pleasant affect, NAD Skin:Warm and dry, brisk capillary refill HEENT:normocephalic, sclera clear, mucus membranes moist Neck:supple, no JVD, no bruits  Heart:S1S2 RRR without murmur, gallup, rub or click Lungs:clear without rales, rhonchi, or wheezes LNL:GXQJ, non tender, + BS, do not palpate liver spleen or masses Ext:no lower ext edema, 2+ pedal pulses, 2+ radial pulses Neuro:alert and oriented X 3, MAE, follows commands, + facial symmetry    EKG:  EKG is ordered today. The ekg ordered today demonstrates SR 1st degree av block with PR 226 ms last year was 204 ms.      Recent Labs: 04/09/2017: ALT 23; BUN 14; Creatinine, Ser 0.98; Hemoglobin 15.6; Platelets 277; Potassium 4.0; Sodium 134    Lipid Panel    Component Value Date/Time   CHOL 161 04/09/2017 1101   TRIG 57 04/09/2017 1101   HDL 47 04/09/2017 1101   CHOLHDL 3.4 04/09/2017 1101   CHOLHDL 5 09/28/2009 0826   VLDL 18.6 09/28/2009 0826   LDLCALC 103 (H) 04/09/2017 1101       Other studies Reviewed: Additional studies/ records that were reviewed today include: previous OV  notes.   ASSESSMENT AND PLAN:  1.  HTN mildly elevated here, was normal in May 2019. Home recordings are normal as well.  Continue current meds and refill  Follow up with Dr. Johnsie Cancel in 1 year or earlier for problems  2.  Palpitations.  No further episodes   3.  1st degree AV block - monitor.    Current medicines are reviewed with the patient today.  The patient Has no concerns regarding medicines.  The following changes have been made:  See above Labs/ tests ordered today include:see above  Disposition:   FU:  see above  Signed, Cecilie Kicks, NP  11/19/2017 9:40 AM    [address], [place] [place], [place] Phone: [phone]; Fax: [phone]

## 2017-11-19 ENCOUNTER — Encounter: Payer: Self-pay | Admitting: Cardiology

## 2017-11-19 ENCOUNTER — Ambulatory Visit (INDEPENDENT_AMBULATORY_CARE_PROVIDER_SITE_OTHER): Payer: Medicare Other | Admitting: Cardiology

## 2017-11-19 ENCOUNTER — Encounter (INDEPENDENT_AMBULATORY_CARE_PROVIDER_SITE_OTHER): Payer: Self-pay

## 2017-11-19 VITALS — BP 150/88 | HR 77 | Ht 70.0 in | Wt 213.8 lb

## 2017-11-19 DIAGNOSIS — I440 Atrioventricular block, first degree: Secondary | ICD-10-CM

## 2017-11-19 DIAGNOSIS — R002 Palpitations: Secondary | ICD-10-CM

## 2017-11-19 DIAGNOSIS — I10 Essential (primary) hypertension: Secondary | ICD-10-CM | POA: Diagnosis not present

## 2017-11-19 MED ORDER — HYDROCHLOROTHIAZIDE 12.5 MG PO CAPS: 12.5000 mg | ORAL_CAPSULE | Freq: Every day | ORAL | 6 refills | Status: DC

## 2017-11-19 MED ORDER — AMLODIPINE BESYLATE 5 MG PO TABS: 5.0000 mg | ORAL_TABLET | Freq: Every day | ORAL | 3 refills | Status: DC

## 2017-11-19 NOTE — Patient Instructions (Signed)
Medication Instructions:  Your physician recommends that you continue on your current medications as directed. Please refer to the Current Medication list given to you today.   Labwork: None ordered  Testing/Procedures: None ordered  Follow-Up: Your physician wants you to follow-up in: 1 year with Dr. Nishan. You will receive a reminder letter in the mail two months in advance. If you don't receive a letter, please call our office to schedule the follow-up appointment.   Any Other Special Instructions Will Be Listed Below (If Applicable).     If you need a refill on your cardiac medications before your next appointment, please call your pharmacy.   

## 2017-12-17 DIAGNOSIS — L570 Actinic keratosis: Secondary | ICD-10-CM | POA: Diagnosis not present

## 2017-12-17 DIAGNOSIS — L821 Other seborrheic keratosis: Secondary | ICD-10-CM | POA: Diagnosis not present

## 2017-12-17 DIAGNOSIS — D225 Melanocytic nevi of trunk: Secondary | ICD-10-CM | POA: Diagnosis not present

## 2017-12-30 DIAGNOSIS — Z125 Encounter for screening for malignant neoplasm of prostate: Secondary | ICD-10-CM | POA: Diagnosis not present

## 2018-01-01 ENCOUNTER — Other Ambulatory Visit: Payer: Self-pay | Admitting: Allergy and Immunology

## 2018-01-02 ENCOUNTER — Ambulatory Visit: Payer: Self-pay

## 2018-01-03 ENCOUNTER — Ambulatory Visit (INDEPENDENT_AMBULATORY_CARE_PROVIDER_SITE_OTHER): Payer: Medicare Other | Admitting: *Deleted

## 2018-01-03 DIAGNOSIS — T63441D Toxic effect of venom of bees, accidental (unintentional), subsequent encounter: Secondary | ICD-10-CM

## 2018-01-03 DIAGNOSIS — IMO0001 Reserved for inherently not codable concepts without codable children: Secondary | ICD-10-CM

## 2018-01-09 DIAGNOSIS — N200 Calculus of kidney: Secondary | ICD-10-CM | POA: Diagnosis not present

## 2018-01-09 DIAGNOSIS — N5201 Erectile dysfunction due to arterial insufficiency: Secondary | ICD-10-CM | POA: Diagnosis not present

## 2018-01-09 DIAGNOSIS — R3911 Hesitancy of micturition: Secondary | ICD-10-CM | POA: Diagnosis not present

## 2018-01-14 ENCOUNTER — Encounter: Payer: Self-pay | Admitting: Allergy

## 2018-01-14 ENCOUNTER — Ambulatory Visit (INDEPENDENT_AMBULATORY_CARE_PROVIDER_SITE_OTHER): Payer: Medicare Other | Admitting: Allergy

## 2018-01-14 VITALS — BP 138/72 | HR 80 | Resp 20

## 2018-01-14 DIAGNOSIS — IMO0001 Reserved for inherently not codable concepts without codable children: Secondary | ICD-10-CM

## 2018-01-14 DIAGNOSIS — J3089 Other allergic rhinitis: Secondary | ICD-10-CM | POA: Diagnosis not present

## 2018-01-14 DIAGNOSIS — T63441D Toxic effect of venom of bees, accidental (unintentional), subsequent encounter: Secondary | ICD-10-CM

## 2018-01-14 DIAGNOSIS — J4520 Mild intermittent asthma, uncomplicated: Secondary | ICD-10-CM

## 2018-01-14 MED ORDER — EPINEPHRINE 0.3 MG/0.3ML IJ SOAJ: INTRAMUSCULAR | 3 refills | Status: DC

## 2018-01-14 MED ORDER — ALBUTEROL SULFATE HFA 108 (90 BASE) MCG/ACT IN AERS: INHALATION_SPRAY | RESPIRATORY_TRACT | 1 refills | Status: DC

## 2018-01-14 MED ORDER — FLUTICASONE-SALMETEROL 100-50 MCG/DOSE IN AEPB: INHALATION_SPRAY | RESPIRATORY_TRACT | 5 refills | Status: DC

## 2018-01-14 NOTE — Progress Notes (Signed)
Follow-up Note  RE: Alex Castillo MRN: [id number] DOB: Apr 13, 1946 Date of Office Visit: 01/14/2018   History of present illness: Alex Castillo is a 72 y.o. male presenting today for follow-up of hymenoptera allergy, mild asthma and allergic rhinitis.  He was last seen in the office on 04/27/16 by Dr. Neldon Mc.  He states since this time he has been doing relatively well.  He had no complaints today of muscle ache/pain.   He does continue on venom immunotherapy and tolerating injections well without large local or systemic reactions.  He does need refill of his epipen today as his is expired.  He has not had any stings with reactions or need to use his epipen since last visit.   With his asthma he states he is doing well.  He only requires use of Advair in the spring and fall for couple days use.  He has been playing tennis more this summer but denies having any exercise intolerance.  He denies any flares requiring ED/UC visits or oral steroid needs since last visit.  He denies any nighttime awakenings.  He does take claritin as needed as well as nasacort which does control his allergy symptoms when they occur again mostly in spring and fall.    Review of systems: Review of Systems  Constitutional: Negative for chills, fever and malaise/fatigue.  HENT: Negative for congestion, ear discharge, ear pain, nosebleeds and sore throat.   Eyes: Negative for pain, discharge and redness.  Respiratory: Negative for cough, shortness of breath and wheezing.   Cardiovascular: Negative for chest pain.  Gastrointestinal: Negative for abdominal pain, constipation, diarrhea, heartburn, nausea and vomiting.  Musculoskeletal: Negative for joint pain.  Skin: Negative for itching and rash.  Neurological: Negative for headaches.    All other systems negative unless noted above in HPI  Past medical/social/surgical/family history have been reviewed and are unchanged unless specifically indicated  below.  No changes  Medication List: Allergies as of 01/14/2018      Reactions   Bee Venom Anaphylaxis   Hydromorphone Hcl Nausea And Vomiting      Medication List        Accurate as of 01/14/18  4:34 PM. Always use your most recent med list.          albuterol 108 (90 Base) MCG/ACT inhaler Commonly known as:  PROVENTIL HFA;VENTOLIN HFA Inhale two puffs every 4-6 hours if needed for cough or wheeze.   ALPRAZolam 0.25 MG tablet Commonly known as:  XANAX Take 1 tablet (0.25 mg total) by mouth at bedtime as needed. for sleep   amLODipine 5 MG tablet Commonly known as:  NORVASC Take 1 tablet (5 mg total) by mouth daily.   aspirin 81 MG tablet Take 81 mg by mouth daily.   b complex vitamins tablet Take 1 tablet by mouth daily.   COQ-10 PO Take 1 capsule by mouth daily.   desloratadine 5 MG tablet Commonly known as:  CLARINEX Take 5 mg by mouth daily as needed (ALLERGIES).   EPINEPHrine 0.3 mg/0.3 mL Soaj injection Commonly known as:  EPI-PEN Use as directed for life-threatening allergic reaction.   Fish Oil 1200 MG Caps Take 1,200 mg by mouth daily.   Fluticasone-Salmeterol 100-50 MCG/DOSE Aepb Commonly known as:  ADVAIR Inhale one puff twice daily if needed during asthma flares   hydrochlorothiazide 12.5 MG capsule Commonly known as:  MICROZIDE Take 1 capsule (12.5 mg total) by mouth daily.   losartan 100 MG tablet Commonly known  as:  COZAAR TAKE ONE (1) TABLET BY MOUTH EVERY DAY.   NASACORT ALLERGY 24HR NA Place 2 sprays into the nose daily as needed (allergies).   RABEprazole 20 MG tablet Commonly known as:  ACIPHEX Take 1 tablet (20 mg total) by mouth daily.   tadalafil 5 MG tablet Commonly known as:  CIALIS Take 5 mg by mouth daily.   tamsulosin 0.4 MG Caps capsule Commonly known as:  FLOMAX Take 0.4 mg by mouth daily.   Vitamin D3 5000 units Caps Take 5,000 Units by mouth daily.   vitamin E 400 UNIT capsule Take 400 Units by mouth  daily.       Known medication allergies: Allergies  Allergen Reactions  . Bee Venom Anaphylaxis  . Hydromorphone Hcl Nausea And Vomiting     Physical examination: Blood pressure 138/72, pulse 80, resp. rate 20.  General: Alert, interactive, in no acute distress. HEENT: PERRLA, TMs pearly gray, turbinates minimally edematous without discharge, post-pharynx non erythematous. Neck: Supple without lymphadenopathy. Lungs: Clear to auscultation without wheezing, rhonchi or rales. {no increased work of breathing. CV: Normal S1, S2 without murmurs. Abdomen: Nondistended, nontender. Skin: Warm and dry, without lesions or rashes. Extremities:  No clubbing, cyanosis or edema. Neuro:   Grossly intact.  Diagnositics/Labs:  Spirometry: FEV1: 2.09L 69%, FVC: 2.88L 70%  Assessment and plan:   Hymenoptera allergy - stable on immunotherapy which he will continued and have access to epinephrine Asthma, mild intermittent - well controlled. He will continue his current regimen as below Allegra rhinitis - stable with as needed use of nasacort and claritin  1. Advair 100 one inhalation twice a day during periods of asthma   2. Nasacort OTC 1-2 sprays each nostril one time per day as needed for nasal congestion  3. If needed:   A. Proventil HFA 2 puffs every 4-6 hours.  May use 15-20 minutes prior to activity to prevent asthma symptoms during activity.    B. OTC antihistamine  C. OTC nasal saline  4. Continue immunotherapy and EpiPen  5. Obtain flu vaccine this fall  6. Return to clinic in 1 year or earlier if problem   I appreciate the opportunity to take part in Alex Castillo's care. Please do not hesitate to contact me with questions.  Sincerely,   Prudy Feeler, MD Allergy/Immunology [facility]

## 2018-01-14 NOTE — Patient Instructions (Signed)
  1. Advair 100 one inhalation twice a day during periods of asthma   2. Nasacort OTC 1-2 sprays each nostril one time per day as needed for nasal congestion  3. If needed:   A. Proventil HFA 2 puffs every 4-6 hours.  May use 15-20 minutes prior to activity to prevent asthma symptoms during activity.    B. OTC antihistamine  C. OTC nasal saline  4. Continue immunotherapy and EpiPen  5. Obtain flu vaccine this fall  6. Return to clinic in 1 year or earlier if problem

## 2018-02-27 ENCOUNTER — Ambulatory Visit (INDEPENDENT_AMBULATORY_CARE_PROVIDER_SITE_OTHER): Payer: Medicare Other | Admitting: *Deleted

## 2018-02-27 DIAGNOSIS — IMO0001 Reserved for inherently not codable concepts without codable children: Secondary | ICD-10-CM

## 2018-02-27 DIAGNOSIS — T63441D Toxic effect of venom of bees, accidental (unintentional), subsequent encounter: Secondary | ICD-10-CM | POA: Diagnosis not present

## 2018-03-07 ENCOUNTER — Other Ambulatory Visit: Payer: Self-pay

## 2018-03-07 ENCOUNTER — Other Ambulatory Visit: Payer: Self-pay | Admitting: Internal Medicine

## 2018-03-07 MED ORDER — LOSARTAN POTASSIUM 100 MG PO TABS: ORAL_TABLET | ORAL | 8 refills | Status: DC

## 2018-03-07 NOTE — Telephone Encounter (Signed)
May we refill Sir? 

## 2018-03-10 ENCOUNTER — Other Ambulatory Visit: Payer: Self-pay | Admitting: Internal Medicine

## 2018-03-10 NOTE — Telephone Encounter (Signed)
I refilled it x 2   Please inform Alex Castillo he needs to have an appointment to get more refills due to controlled substance regulations so he should make a December appt

## 2018-03-10 NOTE — Telephone Encounter (Signed)
Left him a detailed voice mail to call us back to set up an appointment for December.

## 2018-03-19 DIAGNOSIS — M5136 Other intervertebral disc degeneration, lumbar region: Secondary | ICD-10-CM | POA: Diagnosis not present

## 2018-03-19 DIAGNOSIS — J302 Other seasonal allergic rhinitis: Secondary | ICD-10-CM | POA: Diagnosis not present

## 2018-03-19 DIAGNOSIS — Z1389 Encounter for screening for other disorder: Secondary | ICD-10-CM | POA: Diagnosis not present

## 2018-03-19 DIAGNOSIS — H6120 Impacted cerumen, unspecified ear: Secondary | ICD-10-CM | POA: Diagnosis not present

## 2018-03-19 DIAGNOSIS — I10 Essential (primary) hypertension: Secondary | ICD-10-CM | POA: Diagnosis not present

## 2018-03-19 DIAGNOSIS — Z6830 Body mass index (BMI) 30.0-30.9, adult: Secondary | ICD-10-CM | POA: Diagnosis not present

## 2018-03-19 DIAGNOSIS — F411 Generalized anxiety disorder: Secondary | ICD-10-CM | POA: Diagnosis not present

## 2018-03-19 DIAGNOSIS — R739 Hyperglycemia, unspecified: Secondary | ICD-10-CM | POA: Diagnosis not present

## 2018-03-19 DIAGNOSIS — J4520 Mild intermittent asthma, uncomplicated: Secondary | ICD-10-CM | POA: Diagnosis not present

## 2018-03-19 DIAGNOSIS — K219 Gastro-esophageal reflux disease without esophagitis: Secondary | ICD-10-CM | POA: Diagnosis not present

## 2018-03-19 DIAGNOSIS — K449 Diaphragmatic hernia without obstruction or gangrene: Secondary | ICD-10-CM | POA: Diagnosis not present

## 2018-03-19 DIAGNOSIS — Z23 Encounter for immunization: Secondary | ICD-10-CM | POA: Diagnosis not present

## 2018-03-25 ENCOUNTER — Telehealth: Payer: Self-pay | Admitting: Internal Medicine

## 2018-03-25 NOTE — Telephone Encounter (Signed)
Patient has been recently dx Diabetic and put on Metformin, unsure mg's. He has lots of questions about diet, etc. I told him to call his PCP back and go back there for a place to start since they dx him.

## 2018-03-25 NOTE — Telephone Encounter (Signed)
Patient calling to speak with PJ about a medication.

## 2018-03-26 DIAGNOSIS — I10 Essential (primary) hypertension: Secondary | ICD-10-CM | POA: Diagnosis not present

## 2018-03-26 DIAGNOSIS — F411 Generalized anxiety disorder: Secondary | ICD-10-CM | POA: Diagnosis not present

## 2018-03-26 DIAGNOSIS — E1165 Type 2 diabetes mellitus with hyperglycemia: Secondary | ICD-10-CM | POA: Diagnosis not present

## 2018-04-01 ENCOUNTER — Other Ambulatory Visit: Payer: Self-pay

## 2018-04-09 ENCOUNTER — Telehealth: Payer: Self-pay

## 2018-04-09 NOTE — Telephone Encounter (Signed)
I added this medicine to his chart to update it.

## 2018-04-09 NOTE — Telephone Encounter (Signed)
I sent the fax back to [facility] that we do not want to refill the alprazolam since he is also on the clonazepam. I called and left Alex Castillo a detailed message about this matter.

## 2018-04-09 NOTE — Telephone Encounter (Signed)
One or the other not both clonazepam and alprazolam  Maybe his PCP will be handling this from now on?

## 2018-04-09 NOTE — Telephone Encounter (Signed)
Received fax requesting rx for alprazolam 0.25mg . They noted that he got clonazepam 0.5 mg one BID from Dr Mellody Memos on 03/26/18 and want to know if you want to refill the xanax as well. Please advise Sir, thank you.

## 2018-04-17 DIAGNOSIS — E1165 Type 2 diabetes mellitus with hyperglycemia: Secondary | ICD-10-CM | POA: Diagnosis not present

## 2018-04-21 DIAGNOSIS — E1165 Type 2 diabetes mellitus with hyperglycemia: Secondary | ICD-10-CM | POA: Diagnosis not present

## 2018-04-24 ENCOUNTER — Ambulatory Visit (INDEPENDENT_AMBULATORY_CARE_PROVIDER_SITE_OTHER): Payer: Medicare Other | Admitting: *Deleted

## 2018-04-24 DIAGNOSIS — IMO0001 Reserved for inherently not codable concepts without codable children: Secondary | ICD-10-CM

## 2018-04-24 DIAGNOSIS — T63441D Toxic effect of venom of bees, accidental (unintentional), subsequent encounter: Secondary | ICD-10-CM | POA: Diagnosis not present

## 2018-04-24 DIAGNOSIS — E1165 Type 2 diabetes mellitus with hyperglycemia: Secondary | ICD-10-CM | POA: Diagnosis not present

## 2018-05-12 DIAGNOSIS — E119 Type 2 diabetes mellitus without complications: Secondary | ICD-10-CM | POA: Diagnosis not present

## 2018-06-19 ENCOUNTER — Ambulatory Visit (INDEPENDENT_AMBULATORY_CARE_PROVIDER_SITE_OTHER): Payer: Medicare Other | Admitting: *Deleted

## 2018-06-19 DIAGNOSIS — T63441D Toxic effect of venom of bees, accidental (unintentional), subsequent encounter: Secondary | ICD-10-CM | POA: Diagnosis not present

## 2018-06-19 DIAGNOSIS — IMO0001 Reserved for inherently not codable concepts without codable children: Secondary | ICD-10-CM

## 2018-06-25 DIAGNOSIS — E119 Type 2 diabetes mellitus without complications: Secondary | ICD-10-CM | POA: Diagnosis not present

## 2018-07-14 DIAGNOSIS — M25571 Pain in right ankle and joints of right foot: Secondary | ICD-10-CM | POA: Diagnosis not present

## 2018-08-04 DIAGNOSIS — M25511 Pain in right shoulder: Secondary | ICD-10-CM | POA: Diagnosis not present

## 2018-08-04 DIAGNOSIS — M25611 Stiffness of right shoulder, not elsewhere classified: Secondary | ICD-10-CM | POA: Diagnosis not present

## 2018-08-07 DIAGNOSIS — M25511 Pain in right shoulder: Secondary | ICD-10-CM | POA: Diagnosis not present

## 2018-08-07 DIAGNOSIS — M25611 Stiffness of right shoulder, not elsewhere classified: Secondary | ICD-10-CM | POA: Diagnosis not present

## 2018-08-12 DIAGNOSIS — M25511 Pain in right shoulder: Secondary | ICD-10-CM | POA: Diagnosis not present

## 2018-08-12 DIAGNOSIS — M25611 Stiffness of right shoulder, not elsewhere classified: Secondary | ICD-10-CM | POA: Diagnosis not present

## 2018-08-13 DIAGNOSIS — M25511 Pain in right shoulder: Secondary | ICD-10-CM | POA: Diagnosis not present

## 2018-08-13 DIAGNOSIS — M25611 Stiffness of right shoulder, not elsewhere classified: Secondary | ICD-10-CM | POA: Diagnosis not present

## 2018-08-14 ENCOUNTER — Other Ambulatory Visit: Payer: Self-pay

## 2018-08-14 ENCOUNTER — Telehealth: Payer: Self-pay | Admitting: Allergy

## 2018-08-14 ENCOUNTER — Ambulatory Visit (INDEPENDENT_AMBULATORY_CARE_PROVIDER_SITE_OTHER): Payer: Medicare Other | Admitting: *Deleted

## 2018-08-14 DIAGNOSIS — T63441D Toxic effect of venom of bees, accidental (unintentional), subsequent encounter: Secondary | ICD-10-CM

## 2018-08-14 DIAGNOSIS — IMO0001 Reserved for inherently not codable concepts without codable children: Secondary | ICD-10-CM

## 2018-08-14 NOTE — Telephone Encounter (Signed)
Olson went to refill his ADVAIR and his insurance changed it to a tier 3 and it was expensive.  He would like to know what he could get to replace ADVAIR that is in his insurance plan.  Please advise.

## 2018-08-18 NOTE — Telephone Encounter (Signed)
[employer]- BIN # [id number]; RX# [id number]; Group# [id number].

## 2018-08-18 NOTE — Telephone Encounter (Signed)
Left message asking patient to call us and let us know what Prescription Benefit plan he uses. Will need to get name of plan and any information on the card such as BIN #, RX #, Group #, etc.

## 2018-08-18 NOTE — Telephone Encounter (Signed)
I called patient and let him know that it appears that all the steroid inhalers comparable to Advair are going to be a tier 3, according to the [employer].  I suggested that he could maybe go to [employer] and login and see if he can find any more information regarding pricing. Patient voiced understanding.

## 2018-08-19 DIAGNOSIS — M25511 Pain in right shoulder: Secondary | ICD-10-CM | POA: Diagnosis not present

## 2018-08-19 DIAGNOSIS — M25611 Stiffness of right shoulder, not elsewhere classified: Secondary | ICD-10-CM | POA: Diagnosis not present

## 2018-08-21 DIAGNOSIS — M25611 Stiffness of right shoulder, not elsewhere classified: Secondary | ICD-10-CM | POA: Diagnosis not present

## 2018-08-21 DIAGNOSIS — M25511 Pain in right shoulder: Secondary | ICD-10-CM | POA: Diagnosis not present

## 2018-08-26 DIAGNOSIS — M25611 Stiffness of right shoulder, not elsewhere classified: Secondary | ICD-10-CM | POA: Diagnosis not present

## 2018-08-26 DIAGNOSIS — M25511 Pain in right shoulder: Secondary | ICD-10-CM | POA: Diagnosis not present

## 2018-08-28 DIAGNOSIS — M25511 Pain in right shoulder: Secondary | ICD-10-CM | POA: Diagnosis not present

## 2018-08-28 DIAGNOSIS — M25611 Stiffness of right shoulder, not elsewhere classified: Secondary | ICD-10-CM | POA: Diagnosis not present

## 2018-09-01 ENCOUNTER — Other Ambulatory Visit: Payer: Self-pay | Admitting: Physician Assistant

## 2018-09-01 DIAGNOSIS — M25611 Stiffness of right shoulder, not elsewhere classified: Secondary | ICD-10-CM | POA: Diagnosis not present

## 2018-09-01 DIAGNOSIS — M25511 Pain in right shoulder: Secondary | ICD-10-CM | POA: Diagnosis not present

## 2018-09-03 DIAGNOSIS — M25611 Stiffness of right shoulder, not elsewhere classified: Secondary | ICD-10-CM | POA: Diagnosis not present

## 2018-09-03 DIAGNOSIS — M25511 Pain in right shoulder: Secondary | ICD-10-CM | POA: Diagnosis not present

## 2018-09-08 DIAGNOSIS — M25611 Stiffness of right shoulder, not elsewhere classified: Secondary | ICD-10-CM | POA: Diagnosis not present

## 2018-09-08 DIAGNOSIS — M25511 Pain in right shoulder: Secondary | ICD-10-CM | POA: Diagnosis not present

## 2018-09-09 ENCOUNTER — Ambulatory Visit (INDEPENDENT_AMBULATORY_CARE_PROVIDER_SITE_OTHER): Payer: Medicare Other | Admitting: Internal Medicine

## 2018-09-09 ENCOUNTER — Other Ambulatory Visit: Payer: Self-pay

## 2018-09-09 ENCOUNTER — Encounter: Payer: Self-pay | Admitting: Internal Medicine

## 2018-09-09 DIAGNOSIS — K219 Gastro-esophageal reflux disease without esophagitis: Secondary | ICD-10-CM | POA: Diagnosis not present

## 2018-09-09 DIAGNOSIS — R1319 Other dysphagia: Secondary | ICD-10-CM

## 2018-09-09 NOTE — Patient Instructions (Signed)
Good to speak with you today.  Sounds like things are going ok overall.  Continue your current treatment and call me/see me as needed.  I appreciate the opportunity to care for you. Gatha Mayer, MD, [facility]

## 2018-09-09 NOTE — Progress Notes (Signed)
TELEHEALTH ENCOUNTER IN SETTING OF COVID-19 PANDEMIC - REQUESTED BY PATIENT SERVICE PROVIDED BY TELEMEDECINE - TYPE: phone PATIENT LOCATION: car PATIENT HAS CONSENTED TO TELEHEALTH VISIT PROVIDER LOCATION: OFFICE PARTICIPANTS OTHER THAN PATIENT:none TIME SPENT ON CALL 10 minutes    Alex Castillo 73 y.o. 03-14-1946 [id number]  Assessment & Plan:  RECURRENT DYSPHAGIA ? ESOPHAGEAL DYSMOTILITY Mild problems rarely with this Continue current tx Reassured ? Overlap with anxiety as benzodiazepines help  GERD Doing well on Aciphex Will continue that      Subjective:   Chief Complaint:GERD  HPI telehealth f/u re: GERD and hx chronirecurrent dysphagia Aciphex controlling heartburn well. Rare "slow swallowing" with dry foods like canned tuna. Losing weight intentionally with new DM dx Alprazolam works better than clonazepam to relax the esophagus and will use clonazepam 1/2 tab Q AM - PCP recommended clonazepam due to longer half life  Has some alprazolam left over  Still on Small dose of Zoloft   Allergies  Allergen Reactions  . Bee Venom Anaphylaxis  . Hydromorphone Hcl Nausea And Vomiting   Current Meds  Medication Sig  . albuterol (PROAIR HFA) 108 (90 Base) MCG/ACT inhaler Inhale two puffs every 4-6 hours if needed for cough or wheeze.  [name] ALPRAZolam (XANAX) 0.25 MG tablet TAKE ONE TABLET BY MOUTH AT BEDTIME AS NEEDED FOR SLEEP  . amLODipine (NORVASC) 5 MG tablet Take 10 mg by mouth daily.  [name] aspirin 81 MG tablet Take 81 mg by mouth daily.    [name] b complex vitamins tablet Take 1 tablet by mouth daily.  . Cholecalciferol (VITAMIN D3) 5000 units CAPS Take 5,000 Units by mouth daily.  . clonazePAM (KLONOPIN) 0.5 MG tablet Take 0.5 mg by mouth daily as needed.   . Coenzyme Q10 (COQ-10 PO) Take 1 capsule by mouth daily.  [name] desloratadine (CLARINEX) 5 MG tablet Take 5 mg by mouth daily as needed (ALLERGIES).   [name] EPINEPHrine 0.3 mg/0.3 mL IJ SOAJ injection Use as  directed for life-threatening allergic reaction.  . Fluticasone-Salmeterol (ADVAIR DISKUS) 100-50 MCG/DOSE AEPB Inhale one puff twice daily if needed during asthma flares  . hydrochlorothiazide (MICROZIDE) 12.5 MG capsule Take 1 capsule (12.5 mg total) by mouth daily.  [name] losartan (COZAAR) 100 MG tablet TAKE ONE (1) TABLET BY MOUTH EVERY DAY.  [name] METFORMIN HCL PO Take 500 mg by mouth daily.   . Omega-3 Fatty Acids (FISH OIL) 1200 MG CAPS Take 1,200 mg by mouth daily.  [name] OVER THE COUNTER MEDICATION Super Beta Prostate Take 2 daily  . pravastatin (PRAVACHOL) 20 MG tablet Take 1 tablet by mouth every other day.  . RABEprazole (ACIPHEX) 20 MG tablet Take 1 tablet (20 mg total) by mouth daily.  . tadalafil (CIALIS) 5 MG tablet Take 5 mg by mouth daily.  . Triamcinolone Acetonide (NASACORT ALLERGY 24HR NA) Place 2 sprays into the nose daily as needed (allergies).   . vitamin E 400 UNIT capsule Take 400 Units by mouth daily.   Past Medical History:  Diagnosis Date  . Adenomatous polyps   . Anxiety   . Arthritis   . Asthma   . BPH (benign prostatic hyperplasia)   . Cancer ([place])    basal cell on nose  . Complication of anesthesia    slow to wake up trouble voiding after a back surgery   . Diabetes ([place])   . Diverticulosis   . ED (erectile dysfunction)   . Esophageal stricture   . First degree AV block   .  GERD (gastroesophageal reflux disease)   . History of kidney stones   . HTN (hypertension)    ETT negative 10/08  . Hymenoptera allergy   . Internal hemorrhoids   . Palpitations   . Staph infection    in right elbow  . Unspecified gastritis and gastroduodenitis without mention of hemorrhage 02/19/2013   Past Surgical History:  Procedure Laterality Date  . bilateral inguinal hernia repair    . COLONOSCOPY W/ BIOPSIES AND POLYPECTOMY  09/15/2008   adonomatous polyps, diverticulosis, internal hemorrhoids  . CYSTOSCOPY/RETROGRADE/URETEROSCOPY/STONE EXTRACTION WITH BASKET Left 12/08/2012    Procedure: CYSTOSCOPY, LEFT RETROGRADE, LEFT URETEROSCOPY/STONE EXTRACTION WITH BASKET;  Surgeon: Alexis Frock, MD;  Location: [facility];  Service: Urology;  Laterality: Left;  . ESOPHAGOGASTRODUODENOSCOPY  02/19/2013  . INGUINAL HERNIA REPAIR Left 07/25/2016   Procedure: LAPAROSCOPIC REPAIR RECURRENT LEFT INGUINAL HERNIA WITH MESH POSSIBLE OPEN;  Surgeon: Fanny Skates, MD;  Location: [facility];  Service: General;  Laterality: Left;  . INSERTION OF MESH Left 07/25/2016   Procedure: INSERTION OF MESH;  Surgeon: Fanny Skates, MD;  Location: [facility];  Service: General;  Laterality: Left;  . lower back surgery with lumbar vertebral repair    . nasl septoplasty    . right foot surgery    . UPPER GASTROINTESTINAL ENDOSCOPY  02/07/2010   w/dil, tourtuous esophagus, hiatal hernia   Social History   Social History Narrative   Daily Caffeine use sodas and tea   family history includes Arthritis in his father; Asthma in his maternal grandfather; Emphysema in his father; Heart disease in his father; Hypertension in his mother; Liver disease in his daughter; Lung cancer in his father.   Review of Systems As above

## 2018-09-09 NOTE — Assessment & Plan Note (Signed)
Doing well on Aciphex Will continue that

## 2018-09-09 NOTE — Assessment & Plan Note (Signed)
Mild problems rarely with this Continue current tx Reassured ? Overlap with anxiety as benzodiazepines help

## 2018-09-10 DIAGNOSIS — M25611 Stiffness of right shoulder, not elsewhere classified: Secondary | ICD-10-CM | POA: Diagnosis not present

## 2018-09-10 DIAGNOSIS — M25511 Pain in right shoulder: Secondary | ICD-10-CM | POA: Diagnosis not present

## 2018-09-15 DIAGNOSIS — M25611 Stiffness of right shoulder, not elsewhere classified: Secondary | ICD-10-CM | POA: Diagnosis not present

## 2018-09-15 DIAGNOSIS — M25511 Pain in right shoulder: Secondary | ICD-10-CM | POA: Diagnosis not present

## 2018-09-16 ENCOUNTER — Other Ambulatory Visit: Payer: Self-pay | Admitting: Physician Assistant

## 2018-09-22 DIAGNOSIS — M25511 Pain in right shoulder: Secondary | ICD-10-CM | POA: Diagnosis not present

## 2018-09-22 DIAGNOSIS — M25611 Stiffness of right shoulder, not elsewhere classified: Secondary | ICD-10-CM | POA: Diagnosis not present

## 2018-09-29 DIAGNOSIS — M25611 Stiffness of right shoulder, not elsewhere classified: Secondary | ICD-10-CM | POA: Diagnosis not present

## 2018-09-29 DIAGNOSIS — M25511 Pain in right shoulder: Secondary | ICD-10-CM | POA: Diagnosis not present

## 2018-10-01 DIAGNOSIS — E119 Type 2 diabetes mellitus without complications: Secondary | ICD-10-CM | POA: Diagnosis not present

## 2018-10-01 DIAGNOSIS — E7800 Pure hypercholesterolemia, unspecified: Secondary | ICD-10-CM | POA: Diagnosis not present

## 2018-10-01 DIAGNOSIS — I10 Essential (primary) hypertension: Secondary | ICD-10-CM | POA: Diagnosis not present

## 2018-10-08 DIAGNOSIS — M25611 Stiffness of right shoulder, not elsewhere classified: Secondary | ICD-10-CM | POA: Diagnosis not present

## 2018-10-08 DIAGNOSIS — M25511 Pain in right shoulder: Secondary | ICD-10-CM | POA: Diagnosis not present

## 2018-10-09 ENCOUNTER — Ambulatory Visit (INDEPENDENT_AMBULATORY_CARE_PROVIDER_SITE_OTHER): Payer: Medicare Other | Admitting: *Deleted

## 2018-10-09 ENCOUNTER — Other Ambulatory Visit: Payer: Self-pay

## 2018-10-09 DIAGNOSIS — T63441D Toxic effect of venom of bees, accidental (unintentional), subsequent encounter: Secondary | ICD-10-CM | POA: Diagnosis not present

## 2018-10-09 DIAGNOSIS — IMO0001 Reserved for inherently not codable concepts without codable children: Secondary | ICD-10-CM

## 2018-10-10 ENCOUNTER — Other Ambulatory Visit: Payer: Self-pay | Admitting: Cardiology

## 2018-12-04 ENCOUNTER — Other Ambulatory Visit: Payer: Self-pay

## 2018-12-04 ENCOUNTER — Ambulatory Visit (INDEPENDENT_AMBULATORY_CARE_PROVIDER_SITE_OTHER): Payer: Medicare Other | Admitting: *Deleted

## 2018-12-04 DIAGNOSIS — T63441D Toxic effect of venom of bees, accidental (unintentional), subsequent encounter: Secondary | ICD-10-CM

## 2018-12-04 DIAGNOSIS — IMO0001 Reserved for inherently not codable concepts without codable children: Secondary | ICD-10-CM

## 2018-12-09 ENCOUNTER — Other Ambulatory Visit: Payer: Self-pay | Admitting: Cardiovascular Disease

## 2018-12-30 DIAGNOSIS — L570 Actinic keratosis: Secondary | ICD-10-CM | POA: Diagnosis not present

## 2018-12-30 DIAGNOSIS — L821 Other seborrheic keratosis: Secondary | ICD-10-CM | POA: Diagnosis not present

## 2018-12-30 DIAGNOSIS — D1801 Hemangioma of skin and subcutaneous tissue: Secondary | ICD-10-CM | POA: Diagnosis not present

## 2018-12-31 DIAGNOSIS — E119 Type 2 diabetes mellitus without complications: Secondary | ICD-10-CM | POA: Diagnosis not present

## 2018-12-31 DIAGNOSIS — I10 Essential (primary) hypertension: Secondary | ICD-10-CM | POA: Diagnosis not present

## 2019-01-28 ENCOUNTER — Other Ambulatory Visit: Payer: Self-pay

## 2019-01-28 ENCOUNTER — Ambulatory Visit (INDEPENDENT_AMBULATORY_CARE_PROVIDER_SITE_OTHER): Payer: Medicare Other | Admitting: *Deleted

## 2019-01-28 DIAGNOSIS — IMO0001 Reserved for inherently not codable concepts without codable children: Secondary | ICD-10-CM

## 2019-01-28 DIAGNOSIS — T63441D Toxic effect of venom of bees, accidental (unintentional), subsequent encounter: Secondary | ICD-10-CM | POA: Diagnosis not present

## 2019-01-29 ENCOUNTER — Ambulatory Visit: Payer: Medicare Other

## 2019-01-29 DIAGNOSIS — N5201 Erectile dysfunction due to arterial insufficiency: Secondary | ICD-10-CM | POA: Diagnosis not present

## 2019-01-29 DIAGNOSIS — N200 Calculus of kidney: Secondary | ICD-10-CM | POA: Diagnosis not present

## 2019-01-29 DIAGNOSIS — R3911 Hesitancy of micturition: Secondary | ICD-10-CM | POA: Diagnosis not present

## 2019-02-02 ENCOUNTER — Telehealth: Payer: Self-pay | Admitting: Internal Medicine

## 2019-03-02 ENCOUNTER — Ambulatory Visit (INDEPENDENT_AMBULATORY_CARE_PROVIDER_SITE_OTHER): Payer: Medicare Other | Admitting: Cardiovascular Disease

## 2019-03-02 ENCOUNTER — Other Ambulatory Visit: Payer: Self-pay

## 2019-03-02 VITALS — Ht 70.0 in

## 2019-03-02 DIAGNOSIS — R9431 Abnormal electrocardiogram [ECG] [EKG]: Secondary | ICD-10-CM | POA: Diagnosis not present

## 2019-03-02 DIAGNOSIS — I440 Atrioventricular block, first degree: Secondary | ICD-10-CM | POA: Diagnosis not present

## 2019-03-02 DIAGNOSIS — I10 Essential (primary) hypertension: Secondary | ICD-10-CM

## 2019-03-02 DIAGNOSIS — R002 Palpitations: Secondary | ICD-10-CM | POA: Diagnosis not present

## 2019-03-02 DIAGNOSIS — E782 Mixed hyperlipidemia: Secondary | ICD-10-CM | POA: Diagnosis not present

## 2019-03-02 MED ORDER — AMLODIPINE BESYLATE 10 MG PO TABS: 10.0000 mg | ORAL_TABLET | Freq: Every day | ORAL | 3 refills | Status: DC

## 2019-03-02 MED ORDER — ROSUVASTATIN CALCIUM 5 MG PO TABS: 5.0000 mg | ORAL_TABLET | Freq: Every day | ORAL | 3 refills | Status: DC

## 2019-03-02 MED ORDER — LOSARTAN POTASSIUM 100 MG PO TABS: 100.0000 mg | ORAL_TABLET | Freq: Every day | ORAL | 3 refills | Status: DC

## 2019-03-02 NOTE — Patient Instructions (Addendum)
Medication Instructions:  Your physician has recommended you make the following change in your medication:  1-START Crestor 5 mg by mouth daily.  *If you need a refill on your cardiac medications before your next appointment, please call your pharmacy*  Lab Work:  If you have labs (blood work) drawn today and your tests are completely normal, you will receive your results only by: [name] MyChart Message (if you have MyChart) OR . A paper copy in the mail If you have any lab test that is abnormal or we need to change your treatment, we will call you to review the results.  Testing/Procedures: None ordered today.  Follow-Up: At [facility], you and your health needs are our priority.  As part of our continuing mission to provide you with exceptional heart care, we have created designated Provider Care Teams.  These Care Teams include your primary Cardiologist (physician) and Advanced Practice Providers (APPs -  Physician Assistants and Nurse Practitioners) who all work together to provide you with the care you need, when you need it.  Your next appointment:   12 months The format for your next appointment:   In Person  Provider:   You may see Dr. Johnsie Cancel or one of the following Advanced Practice Providers on your designated Care Team:    Truitt Merle, NP  Cecilie Kicks, NP  Kathyrn Drown, NP

## 2019-03-02 NOTE — Progress Notes (Signed)
Cardiology Office Note   Date:  03/02/2019   ID:  Cepeda, Franqui August 24, 1945, MRN [id number]  PCP:  Townsend Roger, MD  Cardiologist:  Dr. Johnsie Cancel    No chief complaint on file.     History of Present Illness: Alex Castillo is a 72 y.o. male who presents for HTN, and palpitations.    Pt is active with chronic back issues.  Hx of 1st degree AV block.   Today.he feel great, no chest pain and no SOB.  No lightheadedness no palpitations.  He plays tennis freq.  Years ago he had 3 episodes of rapid HR and none since.  He is allergic to bees and had episode of arrest with initial sting.  Now carries epi pen.  Some white coat HTN  Had myalgias with pravastatin in past  Diagnosed with DM in November and lost a lot of weight    Past Medical History:  Diagnosis Date  . Adenomatous polyps   . Anxiety   . Arthritis   . Asthma   . BPH (benign prostatic hyperplasia)   . Cancer ([place])    basal cell on nose  . Complication of anesthesia    slow to wake up trouble voiding after a back surgery   . Diabetes ([place])   . Diverticulosis   . ED (erectile dysfunction)   . Esophageal stricture   . First degree AV block   . GERD (gastroesophageal reflux disease)   . History of kidney stones   . HTN (hypertension)    ETT negative 10/08  . Hymenoptera allergy   . Internal hemorrhoids   . Palpitations   . Staph infection    in right elbow  . Unspecified gastritis and gastroduodenitis without mention of hemorrhage 02/19/2013    Past Surgical History:  Procedure Laterality Date  . bilateral inguinal hernia repair    . COLONOSCOPY W/ BIOPSIES AND POLYPECTOMY  09/15/2008   adonomatous polyps, diverticulosis, internal hemorrhoids  . CYSTOSCOPY/RETROGRADE/URETEROSCOPY/STONE EXTRACTION WITH BASKET Left 12/08/2012   Procedure: CYSTOSCOPY, LEFT RETROGRADE, LEFT URETEROSCOPY/STONE EXTRACTION WITH BASKET;  Surgeon: Alexis Frock, MD;  Location: [facility];  Service: Urology;  Laterality:  Left;  . ESOPHAGOGASTRODUODENOSCOPY  02/19/2013  . INGUINAL HERNIA REPAIR Left 07/25/2016   Procedure: LAPAROSCOPIC REPAIR RECURRENT LEFT INGUINAL HERNIA WITH MESH POSSIBLE OPEN;  Surgeon: Fanny Skates, MD;  Location: [facility];  Service: General;  Laterality: Left;  . INSERTION OF MESH Left 07/25/2016   Procedure: INSERTION OF MESH;  Surgeon: Fanny Skates, MD;  Location: [facility];  Service: General;  Laterality: Left;  . lower back surgery with lumbar vertebral repair    . nasl septoplasty    . right foot surgery    . UPPER GASTROINTESTINAL ENDOSCOPY  02/07/2010   w/dil, tourtuous esophagus, hiatal hernia     Current Outpatient Medications  Medication Sig Dispense Refill  . albuterol (PROAIR HFA) 108 (90 Base) MCG/ACT inhaler Inhale two puffs every 4-6 hours if needed for cough or wheeze. 1 Inhaler 1  . ALPRAZolam (XANAX) 0.25 MG tablet TAKE ONE TABLET BY MOUTH AT BEDTIME AS NEEDED FOR SLEEP 30 tablet 1  . amLODipine (NORVASC) 5 MG tablet TAKE ONE (1) TABLET ONCE DAILY 90 tablet 1  . aspirin 81 MG tablet Take 81 mg by mouth 2 (two) times daily.     [name] b complex vitamins tablet Take 1 tablet by mouth daily.    . Cholecalciferol (VITAMIN D3) 5000 units CAPS Take 5,000 Units by mouth daily.    [name]  clonazePAM (KLONOPIN) 0.5 MG tablet Take 0.5 mg by mouth daily as needed.     . Coenzyme Q10 (COQ-10 PO) Take 1 capsule by mouth daily.    [name] desloratadine (CLARINEX) 5 MG tablet Take 5 mg by mouth daily as needed (ALLERGIES).     [name] EPINEPHrine 0.3 mg/0.3 mL IJ SOAJ injection Use as directed for life-threatening allergic reaction. 2 Device 3  . Fluticasone-Salmeterol (ADVAIR DISKUS) 100-50 MCG/DOSE AEPB Inhale one puff twice daily if needed during asthma flares 60 each 5  . losartan (COZAAR) 100 MG tablet TAKE 1 TABLET BY MOUTH DAILY need appt for further refills 1st attempt 30 tablet 2  . METFORMIN HCL PO Take 500 mg by mouth daily.     . Omega-3 Fatty Acids (FISH OIL) 1200 MG CAPS Take 1,200 mg by  mouth daily.    [name] OVER THE COUNTER MEDICATION Super Beta Prostate Take 2 daily    . RABEprazole (ACIPHEX) 20 MG tablet TAKE ONE (1) TABLET ONCE DAILY 90 tablet 1  . tadalafil (CIALIS) 5 MG tablet Take 5 mg by mouth daily.    . Triamcinolone Acetonide (NASACORT ALLERGY 24HR NA) Place 2 sprays into the nose daily as needed (allergies).     . vitamin E 400 UNIT capsule Take 400 Units by mouth daily.     No current facility-administered medications for this visit.     Allergies:   Bee venom and Hydromorphone hcl    Social History:  The patient  reports that he has never smoked. He has never used smokeless tobacco. He reports that he does not drink alcohol or use drugs.   Family History:  The patient's family history includes Arthritis in his father; Asthma in his maternal grandfather; Emphysema in his father; Heart disease in his father; Hypertension in his mother; Liver disease in his daughter; Lung cancer in his father.    ROS:  General:no colds or fevers, no weight changes Skin:no rashes or ulcers HEENT:no blurred vision, no congestion CV:see HPI PUL:see HPI GI:no diarrhea constipation or melena, no indigestion GU:no hematuria, no dysuria MS:no joint pain, no claudication Neuro:no syncope, no lightheadedness Endo:no diabetes, no thyroid disease  Wt Readings from Last 3 Encounters:  09/09/18 195 lb (88.5 kg)  11/19/17 213 lb 12.8 oz (97 kg)  08/23/17 217 lb 6 oz (98.6 kg)     PHYSICAL EXAM: VS:  Ht 5\' 10"  (1.778 m)   BMI 27.98 kg/m  , BMI Body mass index is 27.98 kg/m.  Affect appropriate Healthy:  appears stated age 51: normal Neck supple with no adenopathy JVP normal no bruits no thyromegaly Lungs clear with no wheezing and good diaphragmatic motion Heart:  S1/S2 no murmur, no rub, gallop or click PMI normal Abdomen: benighn, BS positve, no tenderness, no AAA no bruit.  No HSM or HJR Distal pulses intact with no bruits No edema Neuro non-focal Skin warm and  dry No muscular weakness     EKG:  03/02/19 SR rate 65 PR 218 otherwise normal    Recent Labs: No results found for requested labs within last 8760 hours.    Lipid Panel    Component Value Date/Time   CHOL 161 04/09/2017 1101   TRIG 57 04/09/2017 1101   HDL 47 04/09/2017 1101   CHOLHDL 3.4 04/09/2017 1101   CHOLHDL 5 09/28/2009 0826   VLDL 18.6 09/28/2009 0826   LDLCALC 103 (H) 04/09/2017 1101       Other studies Reviewed: Additional studies/ records that were reviewed  today include: previous OV notes.   ASSESSMENT AND PLAN:  1.  HTN Well controlled.  Continue current medications and low sodium Dash type diet.    2.  Palpitations.  No further episodes observe   3.  1st degree AV block - monitor. Yearly ECG   4. DM:  Newly diagnosed in November on glucophage A1c improved  5. HLD:  LDL typically runs 100-125 pravastatin caused myalgias in past will try crestor 5 mg M/W/F and have labs checked in [place] 3 months   Current medicines are reviewed with the patient today.  The patient Has no concerns regarding medicines.  The following changes have been made:   Crestor 5 mg  Labs/ tests ordered today include:see above Lipid and liver 3 months   Disposition:   FU:  see above  Signed, Jenkins Rouge, MD  03/02/2019 10:30 AM    [facility] [place], [place], [address] [place], [place] Phone: [phone]; Fax: [phone]

## 2019-03-12 DIAGNOSIS — Z23 Encounter for immunization: Secondary | ICD-10-CM | POA: Diagnosis not present

## 2019-03-18 ENCOUNTER — Encounter: Payer: Self-pay | Admitting: Internal Medicine

## 2019-03-18 ENCOUNTER — Other Ambulatory Visit: Payer: Self-pay

## 2019-03-18 ENCOUNTER — Ambulatory Visit (INDEPENDENT_AMBULATORY_CARE_PROVIDER_SITE_OTHER): Payer: Medicare Other | Admitting: Internal Medicine

## 2019-03-18 VITALS — BP 116/70 | HR 72 | Temp 97.1°F | Ht 70.0 in | Wt 198.4 lb

## 2019-03-18 DIAGNOSIS — R1310 Dysphagia, unspecified: Secondary | ICD-10-CM | POA: Diagnosis not present

## 2019-03-18 DIAGNOSIS — Z1159 Encounter for screening for other viral diseases: Secondary | ICD-10-CM | POA: Diagnosis not present

## 2019-03-18 DIAGNOSIS — R1319 Other dysphagia: Secondary | ICD-10-CM

## 2019-03-18 NOTE — Assessment & Plan Note (Signed)
EGD

## 2019-03-18 NOTE — Patient Instructions (Addendum)
Good to see you today. We will set up an endoscopy to check and dilate the esophagus.  Here are my nutrition tips.  Healthy and nutritious eating and weight loss are made to be harder than they need to be. A simplified diet approach based around eating normally, as much as you want without restricting intake too much is better, I think. You must avoid packaged foods and try to eat real foods as much as possible. Packaged foods, sugary sodas, highly processed foods taste great but are slow poisons that lead to obesity and/or poor health.  It is very helpful to take some time each week and plan your meals. Work with your spouse, partner, family to do this as much as possible. Preparing meals ahead to take to work or school is especially helpful and will save money, too. You can do this and by working together it can take less time.  Some resources that I like are:  www.[facility].org (Do the learning modules about healthy eating)  www.dietdoctor.com - helps with low carb diets and also can learn about and consider intermittent  fasting. If you have diabetes would not do intermittent fasting without checking with your doctor. Best to change what you eat before doing this. Look at the diabetes section.   I recommend you check your blood sugar daily and keep a log. If you are diabetic and check sugars.  Here are some guidelines to help you with meal planning -  Avoid all processed and packaged foods (bread, pasta, crackers, chips, etc) and beverages containing calories.  Avoid added sugars and excessive natural sugars.  Attention to how you feel if you consume artificial sweeteners.  Do they make you more hungry or raise your blood sugar?  With every meal and snack, aim to get 20 g of protein (3 ounces of meat, 4 ounces of fish, 3 eggs, protein powder, 1 cup [place] yogurt, 1 cup cottage cheese, etc.)  Increase fiber in the form of non-starchy vegetables.  These help you feel full with very  little carbohydrates and are good for gut health.  Eat 1 serving healthy carb per meal- 1/2 cup brown rice, beans, potato, corn- pay attention to whether or not this significantly raises your blood sugar if you are checking blood sugars. If it does, reduce the frequency you consume these.   Eat 2-3 servings of lower sugar fruits daily.  This includes berries, apples, oranges, peaches, pears, one half banana.  Have small amounts of good fats such as avocado, nuts, olive oil, nut butters, olives.  Add a little cheese to your salads to make them tasty.   You have been scheduled for an endoscopy. Please follow written instructions given to you at your visit today. If you use inhalers (even only as needed), please bring them with you on the day of your procedure.  I appreciate the opportunity to care for you. Silvano Rusk, MD, [facility]

## 2019-03-18 NOTE — Progress Notes (Signed)
Alex Castillo 73 y.o. 1945-10-09 [id number]  Assessment & Plan:   Encounter Diagnoses  Name Primary?  . Esophageal dysphagia Yes  . RECURRENT DYSPHAGIA ? ESOPHAGEAL DYSMOTILITY   . Special screening examination for viral disease    Alex Castillo is having dysphagia again he is worried about it as he gets.  He has responded to dilation in the past.  It seems like esophageal dysmotility is the underlying issue.  We will schedule for EGD and likely Maloney dilation of the esophagus.  He does have some GERD so he could have a ring also.The risks and benefits as well as alternatives of endoscopic procedure(s) have been discussed and reviewed. All questions answered. The patient agrees to proceed.   RECURRENT DYSPHAGIA ? ESOPHAGEAL DYSMOTILITY EGD  He is done a good job with weight loss is creeping up some, nutrition advice regarding low-carb diet discussed with the patient.  We reviewed restricted feeding/intermittent fasting but since he is a diabetic suggested he not start with that. [facility] and diet Dr. Martina Sinner suggested  Subjective:   Chief Complaint: Dysphagia  HPI Alex Castillo reports that he is having an uptake in difficulty swallowing with food moving slowly in the mid to lower sternal area and some occasional brief impact dysphagia.  This is something he has had off and on throughout the years and has responded to [facility] dilation and perhaps balloon dilation of the esophagus in the past.  Last EGD 2014.  He has lost some weight and kept it off for the most part though it is taking up again.  He is trying to avoid processed foods and snacks.  He was diagnosed with diabetes in the last year or 2 which was a surprise he says.  He has eliminated sugary drinks.  Continues on AcipHex for GERD.  Uses clonazepam intermittently and rare alprazolam.  Wt Readings from Last 3 Encounters:  03/18/19 198 lb 6.4 oz (90 kg)  09/09/18 195 lb (88.5 kg)  11/19/17 213 lb 12.8 oz (97 kg)     Allergies  Allergen Reactions  . Bee Venom Anaphylaxis  . Hydromorphone Hcl Nausea And Vomiting   Current Meds  Medication Sig  . albuterol (PROAIR HFA) 108 (90 Base) MCG/ACT inhaler Inhale two puffs every 4-6 hours if needed for cough or wheeze.  [name] ALPRAZolam (XANAX) 0.25 MG tablet TAKE ONE TABLET BY MOUTH AT BEDTIME AS NEEDED FOR SLEEP  . amLODipine (NORVASC) 10 MG tablet Take 1 tablet (10 mg total) by mouth daily.  [name] aspirin 81 MG tablet Take 81 mg by mouth 2 (two) times daily.   [name] b complex vitamins tablet Take 1 tablet by mouth daily.  . Cholecalciferol (VITAMIN D3) 5000 units CAPS Take 5,000 Units by mouth daily.  . clonazePAM (KLONOPIN) 0.5 MG tablet Take 0.5 mg by mouth daily as needed.   . Coenzyme Q10 (COQ-10 PO) Take 1 capsule by mouth daily.  [name] desloratadine (CLARINEX) 5 MG tablet Take 5 mg by mouth daily as needed (ALLERGIES).   [name] EPINEPHrine 0.3 mg/0.3 mL IJ SOAJ injection Use as directed for life-threatening allergic reaction.  . Fluticasone-Salmeterol (ADVAIR DISKUS) 100-50 MCG/DOSE AEPB Inhale one puff twice daily if needed during asthma flares  . losartan (COZAAR) 100 MG tablet Take 1 tablet (100 mg total) by mouth daily.  . metFORMIN (GLUCOPHAGE) 500 MG tablet Take 500 mg by mouth daily.   . Omega-3 Fatty Acids (FISH OIL) 1200 MG CAPS Take 1,200 mg by mouth daily.  [name] OVER THE  COUNTER MEDICATION Super Beta Prostate Take 2 daily  . RABEprazole (ACIPHEX) 20 MG tablet TAKE ONE (1) TABLET ONCE DAILY  . rosuvastatin (CRESTOR) 5 MG tablet Take 1 tablet (5 mg total) by mouth daily.  . tadalafil (CIALIS) 5 MG tablet Take 5 mg by mouth daily.  . Triamcinolone Acetonide (NASACORT ALLERGY 24HR NA) Place 2 sprays into the nose daily as needed (allergies).   . vitamin E 400 UNIT capsule Take 400 Units by mouth daily.   Past Medical History:  Diagnosis Date  . Adenomatous polyps   . Anxiety   . Arthritis   . Asthma   . BPH (benign prostatic hyperplasia)   . Cancer ([place])     basal cell on nose  . Complication of anesthesia    slow to wake up trouble voiding after a back surgery   . Diabetes ([place])   . Diverticulosis   . ED (erectile dysfunction)   . Esophageal stricture   . First degree AV block   . GERD (gastroesophageal reflux disease)   . History of kidney stones   . HTN (hypertension)    ETT negative 10/08  . Hymenoptera allergy   . Internal hemorrhoids   . Palpitations   . Staph infection    in right elbow  . Unspecified gastritis and gastroduodenitis without mention of hemorrhage 02/19/2013   Past Surgical History:  Procedure Laterality Date  . bilateral inguinal hernia repair    . COLONOSCOPY W/ BIOPSIES AND POLYPECTOMY  09/15/2008   adonomatous polyps, diverticulosis, internal hemorrhoids  . CYSTOSCOPY/RETROGRADE/URETEROSCOPY/STONE EXTRACTION WITH BASKET Left 12/08/2012   Procedure: CYSTOSCOPY, LEFT RETROGRADE, LEFT URETEROSCOPY/STONE EXTRACTION WITH BASKET;  Surgeon: Alexis Frock, MD;  Location: [facility];  Service: Urology;  Laterality: Left;  . ESOPHAGOGASTRODUODENOSCOPY  02/19/2013  . INGUINAL HERNIA REPAIR Left 07/25/2016   Procedure: LAPAROSCOPIC REPAIR RECURRENT LEFT INGUINAL HERNIA WITH MESH POSSIBLE OPEN;  Surgeon: Fanny Skates, MD;  Location: [facility];  Service: General;  Laterality: Left;  . INSERTION OF MESH Left 07/25/2016   Procedure: INSERTION OF MESH;  Surgeon: Fanny Skates, MD;  Location: [facility];  Service: General;  Laterality: Left;  . lower back surgery with lumbar vertebral repair    . nasl septoplasty    . right foot surgery    . UPPER GASTROINTESTINAL ENDOSCOPY  02/07/2010   w/dil, tourtuous esophagus, hiatal hernia   Social History   Social History Narrative   Daily Caffeine use sodas and tea   family history includes Arthritis in his father; Asthma in his maternal grandfather; Emphysema in his father; Heart disease in his father; Hypertension in his mother; Liver disease in his daughter; Lung cancer in his father.    Review of Systems As above as above  Objective:   Physical Exam BP 116/70   Pulse 72   Temp (!) 97.1 F (36.2 C)   Ht 5\' 10"  (1.778 m)   Wt 198 lb 6.4 oz (90 kg)   BMI 28.47 kg/m  Muscular well-developed well-nourished white man Eyes anicteric Lungs clear Normal heart sounds

## 2019-03-20 DIAGNOSIS — Z1159 Encounter for screening for other viral diseases: Secondary | ICD-10-CM | POA: Diagnosis not present

## 2019-03-20 LAB — SARS CORONAVIRUS 2 (TAT 6-24 HRS): SARS Coronavirus 2: NEGATIVE

## 2019-03-25 ENCOUNTER — Encounter: Payer: Self-pay | Admitting: Internal Medicine

## 2019-03-25 ENCOUNTER — Ambulatory Visit (INDEPENDENT_AMBULATORY_CARE_PROVIDER_SITE_OTHER): Payer: Medicare Other | Admitting: *Deleted

## 2019-03-25 ENCOUNTER — Ambulatory Visit (AMBULATORY_SURGERY_CENTER): Payer: Medicare Other | Admitting: Internal Medicine

## 2019-03-25 ENCOUNTER — Other Ambulatory Visit: Payer: Self-pay

## 2019-03-25 VITALS — BP 112/61 | HR 64 | Temp 97.9°F | Resp 15 | Ht 70.0 in | Wt 198.0 lb

## 2019-03-25 DIAGNOSIS — T63441D Toxic effect of venom of bees, accidental (unintentional), subsequent encounter: Secondary | ICD-10-CM

## 2019-03-25 DIAGNOSIS — R1310 Dysphagia, unspecified: Secondary | ICD-10-CM

## 2019-03-25 DIAGNOSIS — K449 Diaphragmatic hernia without obstruction or gangrene: Secondary | ICD-10-CM | POA: Diagnosis not present

## 2019-03-25 DIAGNOSIS — IMO0001 Reserved for inherently not codable concepts without codable children: Secondary | ICD-10-CM

## 2019-03-25 DIAGNOSIS — K222 Esophageal obstruction: Secondary | ICD-10-CM | POA: Diagnosis not present

## 2019-03-25 MED ORDER — SODIUM CHLORIDE 0.9 % IV SOLN: 500.0000 mL | Freq: Once | INTRAVENOUS | Status: DC

## 2019-03-25 NOTE — Progress Notes (Signed)
Called to room to assist during endoscopic procedure.  Patient ID and intended procedure confirmed with present staff. Received instructions for my participation in the procedure from the performing physician.  

## 2019-03-25 NOTE — Progress Notes (Signed)
Pt Drowsy. VSS. To PACU, report to RN. No anesthetic complications noted.  

## 2019-03-25 NOTE — Op Note (Signed)
[place] Patient Name: Alex Castillo Procedure Date: 03/25/2019 8:03 AM MRN: [id number] Endoscopist: Gatha Mayer , MD Age: 73 Referring MD:  Date of Birth: [birth year] Gender: Male Account #: [id number] Procedure:                Upper GI endoscopy Indications:              Dysphagia Medicines:                Propofol per Anesthesia, Monitored Anesthesia Care Procedure:                Pre-Anesthesia Assessment:                           - Prior to the procedure, a History and Physical                            was performed, and patient medications and                            allergies were reviewed. The patient's tolerance of                            previous anesthesia was also reviewed. The risks                            and benefits of the procedure and the sedation                            options and risks were discussed with the patient.                            All questions were answered, and informed consent                            was obtained. Prior Anticoagulants: The patient has                            taken no previous anticoagulant or antiplatelet                            agents. ASA Grade Assessment: III - A patient with                            severe systemic disease. After reviewing the risks                            and benefits, the patient was deemed in                            satisfactory condition to undergo the procedure.                           After obtaining informed consent, the endoscope was  passed under direct vision. Throughout the                            procedure, the patient's blood pressure, pulse, and                            oxygen saturations were monitored continuously. The                            Endoscope was introduced through the mouth, and                            advanced to the second part of duodenum. The upper                            GI endoscopy was  accomplished without difficulty.                            The patient tolerated the procedure well. Scope In: Scope Out: Findings:                 A low-grade of narrowing Schatzki ring was found at                            the gastroesophageal junction. The scope was                            withdrawn. Dilation was performed with a Maloney                            dilator with mild resistance at 66 Fr. The dilation                            site was examined following endoscope reinsertion                            and showed no change. Estimated blood loss: none.                            This was biopsied with a cold forceps for ring                            disruption. No biopsies or other specimens were                            collected for this exam. Estimated blood loss was                            minimal.                           A small sliding hiatal hernia was found.  Patchy mildly erythematous mucosa without bleeding                            was found in the prepyloric region of the stomach.                           The exam was otherwise without abnormality.                           The cardia and gastric fundus were normal on                            retroflexion. Complications:            No immediate complications. Estimated Blood Loss:     Estimated blood loss was minimal. Impression:               - Low-grade of narrowing Schatzki ring (/ some                            muscular component). Dilated. Biopsied to disrupt                            since maloney dilation had no effect. No specimens                            collected.                           - Small sliding hiatal hernia. 1-2 cm                           - Erythematous mucosa in the prepyloric region of                            the stomach.                           - The examination was otherwise normal. Recommendation:           - Patient has a  contact number available for                            emergencies. The signs and symptoms of potential                            delayed complications were discussed with the                            patient. Return to normal activities tomorrow.                            Written discharge instructions were provided to the                            patient.                           -  Resume previous diet.                           - Continue present medications.                           - If this does not resolve dysphagia he is to let                            me know.                           Consider change in PPI frequency or type vs repeat                            dilation Gatha Mayer, MD 03/25/2019 8:25:17 AM This report has been signed electronically.

## 2019-03-25 NOTE — Patient Instructions (Addendum)
There was a ring of scar tissue where the esophagus and stomach meet and I opened that up. This should fix tyour swallowing issues. If it does not let me know.  No signs of cancer or anything bad.  I appreciate the opportunity to care for you. Gatha Mayer, MD  YOU HAD AN ENDOSCOPIC PROCEDURE TODAY AT [facility]:   Refer to the procedure report that was given to you for any specific questions about what was found during the examination.  If the procedure report does not answer your questions, please call your gastroenterologist to clarify.  If you requested that your care partner not be given the details of your procedure findings, then the procedure report has been included in a sealed envelope for you to review at your convenience later.  YOU SHOULD EXPECT: Some feelings of bloating in the abdomen. Passage of more gas than usual.  Walking can help get rid of the air that was put into your GI tract during the procedure and reduce the bloating. If you had a lower endoscopy (such as a colonoscopy or flexible sigmoidoscopy) you may notice spotting of blood in your stool or on the toilet paper. If you underwent a bowel prep for your procedure, you may not have a normal bowel movement for a few days.  Please Note:  You might notice some irritation and congestion in your nose or some drainage.  This is from the oxygen used during your procedure.  There is no need for concern and it should clear up in a day or so.  SYMPTOMS TO REPORT IMMEDIATELY:     Following upper endoscopy (EGD)  Vomiting of blood or coffee ground material  New chest pain or pain under the shoulder blades  Painful or persistently difficult swallowing  New shortness of breath  Fever of 100F or higher  Black, tarry-looking stools  For urgent or emergent issues, a gastroenterologist can be reached at any hour by calling [phone].   DIET:  We do recommend a small meal at first, but then you may  proceed to your regular diet.( PER DR  ,PATIENT DOES NOT NEED TO FOLLOW DILATATION DIET )  Drink plenty of fluids but you should avoid alcoholic beverages for 24 hours.  ACTIVITY:  You should plan to take it easy for the rest of today and you should NOT DRIVE or use heavy machinery until tomorrow (because of the sedation medicines used during the test).    FOLLOW UP: Our staff will call the number listed on your records 48-72 hours following your procedure to check on you and address any questions or concerns that you may have regarding the information given to you following your procedure. If we do not reach you, we will leave a message.  We will attempt to reach you two times.  During this call, we will ask if you have developed any symptoms of COVID 19. If you develop any symptoms (ie: fever, flu-like symptoms, shortness of breath, cough etc.) before then, please call [phone].  If you test positive for Covid 19 in the 2 weeks post procedure, please call and report this information to [place].    If any biopsies were taken you will be contacted by phone or by letter within the next 1-3 weeks.  Please call us at [phone] if you have not heard about the biopsies in 3 weeks.    SIGNATURES/CONFIDENTIALITY: You and/or your care partner have signed paperwork which will be entered  into your electronic medical record.  These signatures attest to the fact that that the information above on your After Visit Summary has been reviewed and is understood.  Full responsibility of the confidentiality of this discharge information lies with you and/or your care-partner.

## 2019-03-25 NOTE — Progress Notes (Signed)
Temp taken by JB VS taken by CW 

## 2019-03-27 ENCOUNTER — Telehealth: Payer: Self-pay

## 2019-03-27 NOTE — Telephone Encounter (Signed)
  Follow up Call-  Call back number 03/25/2019  Post procedure Call Back phone  # [phone]  Permission to leave phone message Yes  Some recent data might be hidden     Patient questions:  Do you have a fever, pain , or abdominal swelling? No. Pain Score  0 *  Have you tolerated food without any problems? Yes.    Have you been able to return to your normal activities? Yes.    Do you have any questions about your discharge instructions: Diet   No. Medications  No. Follow up visit  No.  Do you have questions or concerns about your Care? No.  Actions: * If pain score is 4 or above: No action needed, pain <4.  1. Have you developed a fever since your procedure? no  2.   Have you had an respiratory symptoms (SOB or cough) since your procedure? no  3.   Have you tested positive for COVID 19 since your procedure non  4.   Have you had any family members/close contacts diagnosed with the COVID 19 since your procedure?  No    If yes to any of these questions please route to Joylene John, RN and Alphonsa Gin, RN.

## 2019-04-02 ENCOUNTER — Telehealth: Payer: Self-pay

## 2019-04-02 MED ORDER — CLONAZEPAM 0.5 MG PO TABS: 0.5000 mg | ORAL_TABLET | Freq: Every day | ORAL | 1 refills | Status: DC | PRN

## 2019-04-02 NOTE — Telephone Encounter (Signed)
Pharmacy requesting clonazepam refill , please advise Sir?

## 2019-04-02 NOTE — Telephone Encounter (Signed)
done

## 2019-04-06 ENCOUNTER — Other Ambulatory Visit: Payer: Self-pay

## 2019-04-08 ENCOUNTER — Telehealth: Payer: Self-pay | Admitting: Internal Medicine

## 2019-04-08 NOTE — Telephone Encounter (Signed)
After several phones calls we all figured it out. Patient does have his medicine, he had just forgot he paid cash for it yesterday.

## 2019-05-20 ENCOUNTER — Other Ambulatory Visit: Payer: Self-pay

## 2019-05-20 ENCOUNTER — Ambulatory Visit (INDEPENDENT_AMBULATORY_CARE_PROVIDER_SITE_OTHER): Payer: PPO | Admitting: *Deleted

## 2019-05-20 DIAGNOSIS — IMO0001 Reserved for inherently not codable concepts without codable children: Secondary | ICD-10-CM

## 2019-05-20 DIAGNOSIS — T63441D Toxic effect of venom of bees, accidental (unintentional), subsequent encounter: Secondary | ICD-10-CM | POA: Diagnosis not present

## 2019-07-13 ENCOUNTER — Other Ambulatory Visit: Payer: Self-pay | Admitting: Internal Medicine

## 2019-07-13 NOTE — Telephone Encounter (Signed)
Please advise Sir, thank you. 

## 2019-07-15 ENCOUNTER — Ambulatory Visit: Payer: Self-pay

## 2019-07-16 ENCOUNTER — Ambulatory Visit (INDEPENDENT_AMBULATORY_CARE_PROVIDER_SITE_OTHER): Payer: PPO | Admitting: *Deleted

## 2019-07-16 DIAGNOSIS — T63441D Toxic effect of venom of bees, accidental (unintentional), subsequent encounter: Secondary | ICD-10-CM | POA: Diagnosis not present

## 2019-07-16 DIAGNOSIS — IMO0001 Reserved for inherently not codable concepts without codable children: Secondary | ICD-10-CM

## 2019-08-18 ENCOUNTER — Other Ambulatory Visit: Payer: Self-pay | Admitting: Internal Medicine

## 2019-08-18 NOTE — Telephone Encounter (Signed)
Please advise, thank you Sir. 

## 2019-09-10 ENCOUNTER — Ambulatory Visit (INDEPENDENT_AMBULATORY_CARE_PROVIDER_SITE_OTHER): Payer: PPO

## 2019-09-10 ENCOUNTER — Other Ambulatory Visit: Payer: Self-pay

## 2019-09-10 DIAGNOSIS — T63441D Toxic effect of venom of bees, accidental (unintentional), subsequent encounter: Secondary | ICD-10-CM

## 2019-09-10 DIAGNOSIS — IMO0001 Reserved for inherently not codable concepts without codable children: Secondary | ICD-10-CM

## 2019-09-11 DIAGNOSIS — H40003 Preglaucoma, unspecified, bilateral: Secondary | ICD-10-CM | POA: Diagnosis not present

## 2019-10-10 ENCOUNTER — Other Ambulatory Visit: Payer: Self-pay | Admitting: Internal Medicine

## 2019-11-05 ENCOUNTER — Other Ambulatory Visit: Payer: Self-pay

## 2019-11-05 ENCOUNTER — Ambulatory Visit (INDEPENDENT_AMBULATORY_CARE_PROVIDER_SITE_OTHER): Payer: PPO | Admitting: *Deleted

## 2019-11-05 DIAGNOSIS — T63441D Toxic effect of venom of bees, accidental (unintentional), subsequent encounter: Secondary | ICD-10-CM

## 2019-11-05 DIAGNOSIS — IMO0001 Reserved for inherently not codable concepts without codable children: Secondary | ICD-10-CM

## 2019-11-05 MED ORDER — EPINEPHRINE 0.3 MG/0.3ML IJ SOAJ: INTRAMUSCULAR | 3 refills | Status: DC

## 2019-11-09 ENCOUNTER — Other Ambulatory Visit: Payer: Self-pay | Admitting: Internal Medicine

## 2019-11-09 NOTE — Telephone Encounter (Signed)
Please advise Sir, thank you. 

## 2019-11-10 ENCOUNTER — Telehealth: Payer: Self-pay | Admitting: Allergy and Immunology

## 2019-11-10 NOTE — Telephone Encounter (Signed)
Spoke with [facility], they do have the Teva brand which I gave permission for them to dispense. I left a message to inform Alex Castillo.

## 2019-11-10 NOTE — Telephone Encounter (Signed)
Alex Castillo called and stated that no one has the Mylan generic epi-pen... he states his pharmacy called around and no one has it.. is there another brandy or manufacturer we can send in?

## 2019-12-18 ENCOUNTER — Telehealth: Payer: Self-pay | Admitting: Internal Medicine

## 2019-12-18 NOTE — Telephone Encounter (Signed)
Pharmacy calling to request dx code for clonazepam. Pls contact pharmacy.

## 2019-12-18 NOTE — Telephone Encounter (Signed)
I called and spoke to the pharmacy and gave them the code for anxiety F41.9 for his meds.

## 2019-12-22 ENCOUNTER — Telehealth: Payer: Self-pay | Admitting: Internal Medicine

## 2019-12-22 NOTE — Telephone Encounter (Signed)
I spoke with Alex Castillo and he is going to his PCP tomorrow and he will ask about these medicines. I have informed the pharmacy.

## 2019-12-31 ENCOUNTER — Other Ambulatory Visit: Payer: Self-pay

## 2019-12-31 ENCOUNTER — Ambulatory Visit (INDEPENDENT_AMBULATORY_CARE_PROVIDER_SITE_OTHER): Payer: PPO | Admitting: *Deleted

## 2019-12-31 DIAGNOSIS — IMO0001 Reserved for inherently not codable concepts without codable children: Secondary | ICD-10-CM

## 2019-12-31 DIAGNOSIS — T63441D Toxic effect of venom of bees, accidental (unintentional), subsequent encounter: Secondary | ICD-10-CM

## 2020-01-04 DIAGNOSIS — Z1389 Encounter for screening for other disorder: Secondary | ICD-10-CM | POA: Diagnosis not present

## 2020-01-04 DIAGNOSIS — J4520 Mild intermittent asthma, uncomplicated: Secondary | ICD-10-CM | POA: Diagnosis not present

## 2020-01-04 DIAGNOSIS — I10 Essential (primary) hypertension: Secondary | ICD-10-CM | POA: Diagnosis not present

## 2020-01-04 DIAGNOSIS — Z0000 Encounter for general adult medical examination without abnormal findings: Secondary | ICD-10-CM | POA: Diagnosis not present

## 2020-01-04 DIAGNOSIS — N400 Enlarged prostate without lower urinary tract symptoms: Secondary | ICD-10-CM | POA: Diagnosis not present

## 2020-01-04 DIAGNOSIS — Z6830 Body mass index (BMI) 30.0-30.9, adult: Secondary | ICD-10-CM | POA: Diagnosis not present

## 2020-01-04 DIAGNOSIS — I440 Atrioventricular block, first degree: Secondary | ICD-10-CM | POA: Diagnosis not present

## 2020-01-04 DIAGNOSIS — F411 Generalized anxiety disorder: Secondary | ICD-10-CM | POA: Diagnosis not present

## 2020-01-04 DIAGNOSIS — E119 Type 2 diabetes mellitus without complications: Secondary | ICD-10-CM | POA: Diagnosis not present

## 2020-01-04 DIAGNOSIS — E669 Obesity, unspecified: Secondary | ICD-10-CM | POA: Diagnosis not present

## 2020-01-27 ENCOUNTER — Other Ambulatory Visit: Payer: Self-pay | Admitting: Cardiovascular Disease

## 2020-02-09 DIAGNOSIS — L570 Actinic keratosis: Secondary | ICD-10-CM | POA: Diagnosis not present

## 2020-02-09 DIAGNOSIS — L821 Other seborrheic keratosis: Secondary | ICD-10-CM | POA: Diagnosis not present

## 2020-02-09 DIAGNOSIS — L301 Dyshidrosis [pompholyx]: Secondary | ICD-10-CM | POA: Diagnosis not present

## 2020-02-09 DIAGNOSIS — L578 Other skin changes due to chronic exposure to nonionizing radiation: Secondary | ICD-10-CM | POA: Diagnosis not present

## 2020-02-18 DIAGNOSIS — R3911 Hesitancy of micturition: Secondary | ICD-10-CM | POA: Diagnosis not present

## 2020-02-18 DIAGNOSIS — N200 Calculus of kidney: Secondary | ICD-10-CM | POA: Diagnosis not present

## 2020-02-18 DIAGNOSIS — N5201 Erectile dysfunction due to arterial insufficiency: Secondary | ICD-10-CM | POA: Diagnosis not present

## 2020-02-25 ENCOUNTER — Ambulatory Visit (INDEPENDENT_AMBULATORY_CARE_PROVIDER_SITE_OTHER): Payer: PPO | Admitting: *Deleted

## 2020-02-25 ENCOUNTER — Other Ambulatory Visit: Payer: Self-pay

## 2020-02-25 DIAGNOSIS — Z91038 Other insect allergy status: Secondary | ICD-10-CM

## 2020-02-26 DIAGNOSIS — H40019 Open angle with borderline findings, low risk, unspecified eye: Secondary | ICD-10-CM | POA: Diagnosis not present

## 2020-03-01 ENCOUNTER — Other Ambulatory Visit: Payer: Self-pay | Admitting: Cardiovascular Disease

## 2020-03-01 NOTE — Progress Notes (Signed)
Virtual Visit via Video Note   This visit type was conducted due to national recommendations for restrictions regarding the COVID-19 Pandemic (e.g. social distancing) in an effort to limit this patient's exposure and mitigate transmission in our community.  Due to her co-morbid illnesses, this patient is at least at moderate risk for complications without adequate follow up.  This format is felt to be most appropriate for this patient at this time.  All issues noted in this document were discussed and addressed.  A limited physical exam was performed with this format.  Please refer to the patient's chart for her consent to telehealth for [facility].   Patient Location: Home Physician Location: Office   Date:  03/02/2020   ID:  Alex Castillo, DOB 1946/03/08, MRN [id number]  PCP:  Alex Roger, MD  Cardiologist:  Dr. Johnsie Castillo    No chief complaint on file.    History of Present Illness: Alex Castillo is a 74 y.o. male who presents for HTN, and palpitations.    Pt is active with chronic back issues.  Hx of 1st degree AV block.   Today.he feel great, no chest pain and no SOB.  No lightheadedness no palpitations.  He plays tennis freq.  Years ago he had 3 episodes of rapid HR and none since.  He is allergic to bees and had episode of arrest with initial sting.  Now carries epi pen.  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 Adenomatous polyps   . Anxiety   . Arthritis   . Asthma   . BPH (benign prostatic hyperplasia)   . Cancer ([place])    basal cell on nose  . Complication of  anesthesia    slow to wake up trouble voiding after a back surgery   . Diabetes ([place])   . Diverticulosis   . ED (erectile dysfunction)   . Esophageal stricture   . First degree AV block   . GERD (gastroesophageal reflux disease)   . History of kidney stones   . HTN (hypertension)    ETT negative 10/08  . Hymenoptera allergy   . Internal hemorrhoids   . Palpitations   . Staph infection    in right elbow  . Unspecified gastritis and gastroduodenitis without mention of hemorrhage 02/19/2013    Past Surgical History:  Procedure Laterality Date  . bilateral inguinal hernia repair    . COLONOSCOPY W/ BIOPSIES AND POLYPECTOMY  09/15/2008   adonomatous polyps, diverticulosis, internal hemorrhoids  . CYSTOSCOPY/RETROGRADE/URETEROSCOPY/STONE EXTRACTION WITH BASKET Left 12/08/2012   Procedure: CYSTOSCOPY, LEFT RETROGRADE, LEFT URETEROSCOPY/STONE EXTRACTION WITH BASKET;  Surgeon: Alexis Frock, MD;  Location: [facility];  Service: Urology;  Laterality: Left;  . ESOPHAGOGASTRODUODENOSCOPY  02/19/2013  . INGUINAL HERNIA REPAIR Left 07/25/2016   Procedure: LAPAROSCOPIC REPAIR RECURRENT LEFT INGUINAL HERNIA WITH MESH POSSIBLE OPEN;  Surgeon: Fanny Skates, MD;  Location: [facility];  Service: General;  Laterality: Left;  . INSERTION OF MESH Left 07/25/2016   Procedure: INSERTION OF MESH;  Surgeon: Fanny Skates, MD;  Location:  [facility];  Service: General;  Laterality: Left;  . lower back surgery with lumbar vertebral repair    . nasl septoplasty    . right foot surgery    . UPPER GASTROINTESTINAL ENDOSCOPY  02/07/2010   w/dil, tourtuous esophagus, hiatal hernia     Current Outpatient Medications  Medication Sig Dispense Refill  . albuterol (PROAIR HFA) 108 (90 Base) MCG/ACT inhaler Inhale two puffs every 4-6 hours if needed for cough or wheeze. 1 Inhaler 1  . ALPRAZolam (XANAX) 0.25 MG tablet TAKE ONE TABLET BY MOUTH AT BEDTIME AS NEEDED FOR SLEEP 30 tablet 1  . amLODipine (NORVASC) 10 MG tablet Take 1  tablet (10 mg total) by mouth daily. Appointment needed for further refills. Please call [phone] 1st attempt 30 tablet 0  . aspirin 81 MG tablet Take 81 mg by mouth 2 (two) times daily.     [name] b complex vitamins tablet Take 1 tablet by mouth daily.    . Cholecalciferol (VITAMIN D3) 5000 units CAPS Take 5,000 Units by mouth daily.    . clonazePAM (KLONOPIN) 0.5 MG tablet TAKE 1 TABLET BY MOUTH DAILY AS NEEDED 30 tablet 1  . Coenzyme Q10 (COQ-10 PO) Take 1 capsule by mouth daily.    [name] desloratadine (CLARINEX) 5 MG tablet Take 5 mg by mouth daily as needed (ALLERGIES).     [name] EPINEPHrine 0.3 mg/0.3 mL IJ SOAJ injection Use as directed for life-threatening allergic reaction. 2 each 3  . Fluticasone-Salmeterol (ADVAIR DISKUS) 100-50 MCG/DOSE AEPB Inhale one puff twice daily if needed during asthma flares 60 each 5  . losartan (COZAAR) 100 MG tablet Take 1 tablet (100 mg total) by mouth daily. 90 tablet 3  . metFORMIN (GLUCOPHAGE) 500 MG tablet Take 500 mg by mouth daily.     . Omega-3 Fatty Acids (FISH OIL) 1200 MG CAPS Take 1,200 mg by mouth daily.    . RABEprazole (ACIPHEX) 20 MG tablet TAKE ONE (1) TABLET ONCE DAILY 90 tablet 1  . rosuvastatin (CRESTOR) 5 MG tablet Take 1 tablet (5 mg total) by mouth daily. 90 tablet 3  . tadalafil (CIALIS) 5 MG tablet Take 5 mg by mouth daily.    . tamsulosin (FLOMAX) 0.4 MG CAPS capsule Take 0.4 mg by mouth.    . Triamcinolone Acetonide (NASACORT ALLERGY 24HR NA) Place 2 sprays into the nose daily as needed (allergies).     . vitamin E 400 UNIT capsule Take 400 Units by mouth daily.    [name] OVER THE COUNTER MEDICATION Super Beta Prostate Take 2 daily     No current facility-administered medications for this visit.    Allergies:   Bee venom and Hydromorphone hcl    Social History:  The patient  reports that he has never smoked. He has never used smokeless tobacco. He reports that he does not drink alcohol and does not use drugs.   Family History:  The  patient's family history includes Arthritis in his father; Asthma in his maternal grandfather; Emphysema in his father; Heart disease in his father; Hypertension in his mother; Liver disease in his daughter; Lung cancer in his father.    ROS:  General:no colds or fevers, no weight changes Skin:no rashes or ulcers HEENT:no blurred vision, no congestion CV:see HPI PUL:see HPI GI:no diarrhea constipation or melena, no indigestion GU:no hematuria, no dysuria MS:no joint pain, no claudication Neuro:no syncope, no lightheadedness Endo:no diabetes, no thyroid disease  Wt Readings from Last 3 Encounters:  03/02/20 211 lb (95.7  kg)  03/25/19 198 lb (89.8 kg)  03/18/19 198 lb 6.4 oz (90 kg)     PHYSICAL EXAM: VS:  BP (!) 142/80   Pulse (!) 59   Ht 5\' 10"  (1.778 m)   Wt 211 lb (95.7 kg)   BMI 30.28 kg/m  , BMI Body mass index is 30.28 kg/m.  Telephone no exam    EKG:  03/02/19 SR rate 65 PR 218 otherwise normal    Recent Labs: No results found for requested labs within last 8760 hours.    Lipid Panel    Component Value Date/Time   CHOL 161 04/09/2017 1101   TRIG 57 04/09/2017 1101   HDL 47 04/09/2017 1101   CHOLHDL 3.4 04/09/2017 1101   CHOLHDL 5 09/28/2009 0826   VLDL 18.6 09/28/2009 0826   LDLCALC 103 (H) 04/09/2017 1101       Other studies Reviewed: Additional studies/ records that were reviewed today include: previous OV notes.   ASSESSMENT AND PLAN:  1.  HTN Well controlled.  Continue current medications and low sodium Dash type diet.    2.  Palpitations.  No further episodes observe   3.  1st degree AV block - monitor. Yearly ECG none done today telephone visit   4. DM:  Newly diagnosed in November 2020  on glucophage A1c improved to 6.13 January 2020   5. HLD:  LDL improved 86 on crestor   Current medicines are reviewed with the patient today.  The patient Has no concerns regarding medicines.  The following changes have been made: None Labs/ tests  ordered today include: None   Disposition:   FU: in a year   I have spent 20 minutes reviewing chart direct patient interview and composing notes   Signed, Jenkins Rouge, MD  03/02/2020 9:10 AM    [facility] [address], [place], [place], [place] Phone: [phone]; Fax: [phone]

## 2020-03-02 ENCOUNTER — Other Ambulatory Visit: Payer: Self-pay | Admitting: *Deleted

## 2020-03-02 ENCOUNTER — Other Ambulatory Visit: Payer: Self-pay

## 2020-03-02 ENCOUNTER — Telehealth (INDEPENDENT_AMBULATORY_CARE_PROVIDER_SITE_OTHER): Payer: PPO | Admitting: Cardiovascular Disease

## 2020-03-02 VITALS — BP 142/80 | HR 59 | Ht 70.0 in | Wt 211.0 lb

## 2020-03-02 DIAGNOSIS — R002 Palpitations: Secondary | ICD-10-CM | POA: Diagnosis not present

## 2020-03-02 DIAGNOSIS — E782 Mixed hyperlipidemia: Secondary | ICD-10-CM | POA: Diagnosis not present

## 2020-03-02 DIAGNOSIS — I10 Essential (primary) hypertension: Secondary | ICD-10-CM

## 2020-03-02 MED ORDER — AMLODIPINE BESYLATE 10 MG PO TABS: 10.0000 mg | ORAL_TABLET | Freq: Every day | ORAL | 3 refills | Status: DC

## 2020-03-02 MED ORDER — LOSARTAN POTASSIUM 100 MG PO TABS: 100.0000 mg | ORAL_TABLET | Freq: Every day | ORAL | 3 refills | Status: DC

## 2020-03-02 NOTE — Patient Instructions (Signed)
Your physician recommends that you continue on your current medications as directed. Please refer to the Current Medication list given to you today.  Your physician wants you to follow-up in: [place] will receive a reminder letter in the mail two months in advance. If you don't receive a letter, please call our office to schedule the follow-up appointment.

## 2020-03-23 DIAGNOSIS — E119 Type 2 diabetes mellitus without complications: Secondary | ICD-10-CM | POA: Diagnosis not present

## 2020-03-23 DIAGNOSIS — Z23 Encounter for immunization: Secondary | ICD-10-CM | POA: Diagnosis not present

## 2020-04-21 ENCOUNTER — Ambulatory Visit (INDEPENDENT_AMBULATORY_CARE_PROVIDER_SITE_OTHER): Payer: PPO | Admitting: *Deleted

## 2020-04-21 ENCOUNTER — Other Ambulatory Visit: Payer: Self-pay

## 2020-04-21 DIAGNOSIS — Z91038 Other insect allergy status: Secondary | ICD-10-CM

## 2020-05-20 ENCOUNTER — Other Ambulatory Visit: Payer: Self-pay | Admitting: Allergy

## 2020-05-20 NOTE — Telephone Encounter (Signed)
Pt last seen 01/2018.[name] okay to give a courtesy refill? If he schedules a follow up visit?

## 2020-05-20 NOTE — Telephone Encounter (Signed)
Yes for courtesy.  Needs follow-up

## 2020-05-23 NOTE — Telephone Encounter (Signed)
Left a message for pt to return call. He needs to schedule a follow up appt.

## 2020-06-02 ENCOUNTER — Ambulatory Visit: Payer: PPO | Admitting: Allergy and Immunology

## 2020-06-16 ENCOUNTER — Encounter: Payer: Self-pay | Admitting: Allergy and Immunology

## 2020-06-16 ENCOUNTER — Ambulatory Visit (INDEPENDENT_AMBULATORY_CARE_PROVIDER_SITE_OTHER): Payer: PPO | Admitting: Allergy and Immunology

## 2020-06-16 ENCOUNTER — Ambulatory Visit: Payer: PPO

## 2020-06-16 ENCOUNTER — Other Ambulatory Visit: Payer: Self-pay

## 2020-06-16 VITALS — BP 144/72 | HR 64 | Resp 14 | Ht 69.3 in | Wt 222.0 lb

## 2020-06-16 DIAGNOSIS — Z91038 Other insect allergy status: Secondary | ICD-10-CM

## 2020-06-16 DIAGNOSIS — J4540 Moderate persistent asthma, uncomplicated: Secondary | ICD-10-CM | POA: Diagnosis not present

## 2020-06-16 DIAGNOSIS — J301 Allergic rhinitis due to pollen: Secondary | ICD-10-CM

## 2020-06-16 DIAGNOSIS — J3089 Other allergic rhinitis: Secondary | ICD-10-CM | POA: Diagnosis not present

## 2020-06-16 MED ORDER — ADVAIR DISKUS 100-50 MCG/DOSE IN AEPB: INHALATION_SPRAY | RESPIRATORY_TRACT | 11 refills | Status: DC

## 2020-06-16 MED ORDER — ALBUTEROL SULFATE HFA 108 (90 BASE) MCG/ACT IN AERS: INHALATION_SPRAY | RESPIRATORY_TRACT | 1 refills | Status: AC

## 2020-06-16 NOTE — Patient Instructions (Addendum)
  1.  Continue immunotherapy and EpiPen  2.  Can use Advair 100-1 inhalation 1-2 times per day during periods of lower airway symptoms  3.  Can use Nasacort-1-2 sprays each nostril 1 time per day during periods of upper airway symptoms  4.  If needed:   A.  Albuterol HFA-2 inhalations every 4-6 hours  B.  OTC antihistamine  5.  Return to clinic in 1 year or earlier if problem

## 2020-06-16 NOTE — Progress Notes (Signed)
- [place] [facility] [place] [facility] [place]   Follow-up Note  Referring Provider: Crist Fat, MD Primary Provider: Leonia Reader, Barbara Cower, MD Date of Office Visit: 06/16/2020  Subjective:   Alex Castillo (DOB: 16-Oct-1945) is a 75 y.o. male who [facility] in re-evaluation of the following:  HPI: Haneef returns to this clinic in evaluation of hymenoptera venom hypersensitivity state and a history of mild asthma and allergic rhinitis.  His last visit to this clinic with Dr. Delorse Lek was on 14 January 2018.    He continues on immunotherapy directed against hymenoptera venom without any adverse effect.  Currently he is using this form of therapy about every 8 weeks.  He has not had any anaphylactic reactions following exposure to hymenoptera venom in years.  He has had very little problems with asthma.  He exercises, playing tennis, and rarely uses a short acting bronchodilator.  Rarely does he use Advair.  He only uses Advair a few times per year usually for a week or so if he develops a little bit of respiratory tract symptoms.  It does not sound as though he has required a systemic steroid to treat an exacerbation of asthma.  His nose is doing quite well while using Nasacort.  It does not sound as though he has required an antibiotic to treat an episode of sinusitis.  He has received 3 Pfizer COVID vaccines, the flu vaccine, and his first shingles vaccine within the past year.  Allergies as of 06/16/2020      Reactions   Bee Venom Anaphylaxis   Hydromorphone Hcl Nausea And Vomiting      Medication List      Advair Diskus 100-50 MCG/DOSE Aepb Generic drug: Fluticasone-Salmeterol USE 1 PUFF TWICE DAILY IF NEEDED DURING ASTHMA FLARES   albuterol 108 (90 Base) MCG/ACT inhaler Commonly known as: ProAir HFA Inhale two puffs every 4-6 hours if needed for cough or wheeze.   ALPRAZolam 0.25 MG tablet Commonly known as:  XANAX TAKE ONE TABLET BY MOUTH AT BEDTIME AS NEEDED FOR SLEEP   amLODipine 10 MG tablet Commonly known as: NORVASC Take 1 tablet (10 mg total) by mouth daily.   aspirin 81 MG tablet Take 81 mg by mouth 2 (two) times daily.   b complex vitamins tablet Take 1 tablet by mouth daily.   clonazePAM 0.5 MG tablet Commonly known as: KLONOPIN TAKE 1 TABLET BY MOUTH DAILY AS NEEDED   COQ-10 PO Take 1 capsule by mouth daily.   desloratadine 5 MG tablet Commonly known as: CLARINEX Take 5 mg by mouth daily as needed (ALLERGIES).   EPINEPHrine 0.3 mg/0.3 mL Soaj injection Commonly known as: EPI-PEN Use as directed for life-threatening allergic reaction.   Fish Oil 1200 MG Caps Take 1,200 mg by mouth daily.   losartan 100 MG tablet Commonly known as: COZAAR Take 1 tablet (100 mg total) by mouth daily.   metFORMIN 500 MG tablet Commonly known as: GLUCOPHAGE Take 500 mg by mouth daily.   NASACORT ALLERGY 24HR NA Place 2 sprays into the nose daily as needed (allergies).   OVER THE COUNTER MEDICATION Super Beta Prostate Take 2 daily   RABEprazole 20 MG tablet Commonly known as: ACIPHEX TAKE ONE (1) TABLET ONCE DAILY   rosuvastatin 5 MG tablet Commonly known as: CRESTOR Take 1 tablet (5 mg total) by mouth daily.   tadalafil 5 MG tablet Commonly known as: CIALIS Take 5 mg by  mouth daily.   tamsulosin 0.4 MG Caps capsule Commonly known as: FLOMAX Take 0.4 mg by mouth.   Vitamin D3 125 MCG (5000 UT) Caps Take 5,000 Units by mouth daily.   vitamin E 180 MG (400 UNITS) capsule Take 400 Units by mouth daily.       Past Medical History:  Diagnosis Date  . Adenomatous polyps   . Anxiety   . Arthritis   . Asthma   . BPH (benign prostatic hyperplasia)   . Cancer ([place])    basal cell on nose  . Complication of anesthesia    slow to wake up trouble voiding after a back surgery   . Diabetes ([place])   . Diverticulosis   . ED (erectile dysfunction)   . Esophageal  stricture   . First degree AV block   . GERD (gastroesophageal reflux disease)   . History of kidney stones   . HTN (hypertension)    ETT negative 10/08  . Hymenoptera allergy   . Internal hemorrhoids   . Palpitations   . Staph infection    in right elbow  . Unspecified gastritis and gastroduodenitis without mention of hemorrhage 02/19/2013    Past Surgical History:  Procedure Laterality Date  . bilateral inguinal hernia repair    . COLONOSCOPY W/ BIOPSIES AND POLYPECTOMY  09/15/2008   adonomatous polyps, diverticulosis, internal hemorrhoids  . CYSTOSCOPY/RETROGRADE/URETEROSCOPY/STONE EXTRACTION WITH BASKET Left 12/08/2012   Procedure: CYSTOSCOPY, LEFT RETROGRADE, LEFT URETEROSCOPY/STONE EXTRACTION WITH BASKET;  Surgeon: Alexis Frock, MD;  [facility]: [facility];  Service: Urology;  Laterality: Left;  . ESOPHAGOGASTRODUODENOSCOPY  02/19/2013  . INGUINAL HERNIA REPAIR Left 07/25/2016   Procedure: LAPAROSCOPIC REPAIR RECURRENT LEFT INGUINAL HERNIA WITH MESH POSSIBLE OPEN;  Surgeon: Fanny Skates, MD;  Location: [facility];  Service: General;  Laterality: Left;  . INSERTION OF MESH Left 07/25/2016   Procedure: INSERTION OF MESH;  Surgeon: Fanny Skates, MD;  Location: [facility];  Service: General;  Laterality: Left;  . lower back surgery with lumbar vertebral repair    . nasl septoplasty    . right foot surgery    . UPPER GASTROINTESTINAL ENDOSCOPY  02/07/2010   w/dil, tourtuous esophagus, hiatal hernia    Review of systems negative except as noted in HPI / PMHx or noted below:  Review of Systems  Constitutional: Negative.   HENT: Negative.   Eyes: Negative.   Respiratory: Negative.   Cardiovascular: Negative.   Gastrointestinal: Negative.   Genitourinary: Negative.   Musculoskeletal: Negative.   Skin: Negative.   Neurological: Negative.   Endo/Heme/Allergies: Negative.   Psychiatric/Behavioral: Negative.      Objective:   Vitals:   06/16/20 0818  BP: (!) 144/72  Pulse: 64   Resp: 14  SpO2: 94%   Height: 5' 9.3" (176 cm)  Weight: 222 lb (100.7 kg)   Physical Exam Constitutional:      Appearance: He is not diaphoretic.  HENT:     Head: Normocephalic.     Right Ear: Tympanic membrane, ear canal and external ear normal.     Left Ear: Tympanic membrane, ear canal and external ear normal.     Nose: Nose normal. No mucosal edema or rhinorrhea.     Mouth/Throat:     Mouth: Oropharynx is clear and moist and mucous membranes are normal.     Pharynx: Uvula midline. No oropharyngeal exudate.  Eyes:     Conjunctiva/sclera: Conjunctivae normal.  Neck:     Thyroid: No thyromegaly.     Trachea: Trachea  normal. No tracheal tenderness or tracheal deviation.  Cardiovascular:     Rate and Rhythm: Normal rate and regular rhythm.     Heart sounds: Normal heart sounds, S1 normal and S2 normal. No murmur heard.   Pulmonary:     Effort: No respiratory distress.     Breath sounds: Normal breath sounds. No stridor. No wheezing or rales.  Musculoskeletal:        General: No edema.  Lymphadenopathy:     Head:     Right side of head: No tonsillar adenopathy.     Left side of head: No tonsillar adenopathy.     Cervical: No cervical adenopathy.  Skin:    Findings: No erythema or rash.     Nails: There is no clubbing.  Neurological:     Mental Status: He is alert.     Diagnostics:    Spirometry was performed and demonstrated an FEV1 of 2.02 at 66 % of predicted.  Assessment and Plan:   1. Hymenoptera allergy   2. Asthma, moderate persistent, well-controlled   3. Perennial allergic rhinitis   4. Seasonal allergic rhinitis due to pollen     1.  Continue immunotherapy and EpiPen  2.  Can use Advair 100-1 inhalation 1-2 times per day during periods of lower airway symptoms  3.  Can use Nasacort-1-2 sprays each nostril 1 time per day during periods of upper airway symptoms  4.  If needed:   A.  Albuterol HFA-2 inhalations every 4-6 hours  B.  OTC  antihistamine  5.  Return to clinic in 1 year or earlier if problem  Burke is really doing very well and he will continue on immunotherapy directed against his hymenoptera venom hypersensitivity state and he has the option of utilizing anti-inflammatory agents for both his upper and lower airway as specified above.  Assuming he does well with this plan I will see him back in this clinic in 1 year or earlier if there is a problem.  Allena Katz, MD Allergy / Immunology [place]

## 2020-06-20 ENCOUNTER — Other Ambulatory Visit: Payer: Self-pay | Admitting: Internal Medicine

## 2020-06-24 DIAGNOSIS — I10 Essential (primary) hypertension: Secondary | ICD-10-CM | POA: Diagnosis not present

## 2020-06-24 DIAGNOSIS — E119 Type 2 diabetes mellitus without complications: Secondary | ICD-10-CM | POA: Diagnosis not present

## 2020-06-24 DIAGNOSIS — F411 Generalized anxiety disorder: Secondary | ICD-10-CM | POA: Diagnosis not present

## 2020-06-27 ENCOUNTER — Encounter: Payer: Self-pay | Admitting: Allergy and Immunology

## 2020-06-27 DIAGNOSIS — L578 Other skin changes due to chronic exposure to nonionizing radiation: Secondary | ICD-10-CM | POA: Diagnosis not present

## 2020-06-27 DIAGNOSIS — L570 Actinic keratosis: Secondary | ICD-10-CM | POA: Diagnosis not present

## 2020-06-27 DIAGNOSIS — L821 Other seborrheic keratosis: Secondary | ICD-10-CM | POA: Diagnosis not present

## 2020-06-29 ENCOUNTER — Ambulatory Visit: Payer: PPO | Admitting: Allergy and Immunology

## 2020-07-25 DIAGNOSIS — M542 Cervicalgia: Secondary | ICD-10-CM | POA: Diagnosis not present

## 2020-07-25 DIAGNOSIS — M79641 Pain in right hand: Secondary | ICD-10-CM | POA: Diagnosis not present

## 2020-07-27 DIAGNOSIS — N400 Enlarged prostate without lower urinary tract symptoms: Secondary | ICD-10-CM | POA: Diagnosis not present

## 2020-07-27 DIAGNOSIS — I10 Essential (primary) hypertension: Secondary | ICD-10-CM | POA: Diagnosis not present

## 2020-08-11 ENCOUNTER — Ambulatory Visit: Payer: PPO

## 2020-08-22 ENCOUNTER — Other Ambulatory Visit: Payer: Self-pay

## 2020-08-22 ENCOUNTER — Ambulatory Visit (INDEPENDENT_AMBULATORY_CARE_PROVIDER_SITE_OTHER): Payer: PPO | Admitting: *Deleted

## 2020-08-22 DIAGNOSIS — Z91038 Other insect allergy status: Secondary | ICD-10-CM

## 2020-09-10 DIAGNOSIS — E785 Hyperlipidemia, unspecified: Secondary | ICD-10-CM | POA: Diagnosis not present

## 2020-09-10 DIAGNOSIS — I10 Essential (primary) hypertension: Secondary | ICD-10-CM | POA: Diagnosis not present

## 2020-09-28 DIAGNOSIS — E7800 Pure hypercholesterolemia, unspecified: Secondary | ICD-10-CM | POA: Diagnosis not present

## 2020-09-28 DIAGNOSIS — E119 Type 2 diabetes mellitus without complications: Secondary | ICD-10-CM | POA: Diagnosis not present

## 2020-09-28 DIAGNOSIS — I10 Essential (primary) hypertension: Secondary | ICD-10-CM | POA: Diagnosis not present

## 2020-09-28 DIAGNOSIS — F411 Generalized anxiety disorder: Secondary | ICD-10-CM | POA: Diagnosis not present

## 2020-10-17 ENCOUNTER — Ambulatory Visit: Payer: PPO

## 2020-10-19 ENCOUNTER — Ambulatory Visit (INDEPENDENT_AMBULATORY_CARE_PROVIDER_SITE_OTHER): Payer: PPO | Admitting: *Deleted

## 2020-10-19 ENCOUNTER — Other Ambulatory Visit: Payer: Self-pay

## 2020-10-19 DIAGNOSIS — Z91038 Other insect allergy status: Secondary | ICD-10-CM | POA: Diagnosis not present

## 2020-11-03 DIAGNOSIS — M65341 Trigger finger, right ring finger: Secondary | ICD-10-CM | POA: Diagnosis not present

## 2020-11-03 DIAGNOSIS — M720 Palmar fascial fibromatosis [Dupuytren]: Secondary | ICD-10-CM | POA: Diagnosis not present

## 2020-12-14 ENCOUNTER — Other Ambulatory Visit: Payer: Self-pay

## 2020-12-14 ENCOUNTER — Ambulatory Visit (INDEPENDENT_AMBULATORY_CARE_PROVIDER_SITE_OTHER): Payer: PPO | Admitting: *Deleted

## 2020-12-14 DIAGNOSIS — Z91038 Other insect allergy status: Secondary | ICD-10-CM | POA: Diagnosis not present

## 2021-01-05 DIAGNOSIS — E7800 Pure hypercholesterolemia, unspecified: Secondary | ICD-10-CM | POA: Diagnosis not present

## 2021-01-05 DIAGNOSIS — I440 Atrioventricular block, first degree: Secondary | ICD-10-CM | POA: Diagnosis not present

## 2021-01-05 DIAGNOSIS — I10 Essential (primary) hypertension: Secondary | ICD-10-CM | POA: Diagnosis not present

## 2021-01-05 DIAGNOSIS — N400 Enlarged prostate without lower urinary tract symptoms: Secondary | ICD-10-CM | POA: Diagnosis not present

## 2021-01-05 DIAGNOSIS — Z0000 Encounter for general adult medical examination without abnormal findings: Secondary | ICD-10-CM | POA: Diagnosis not present

## 2021-01-05 DIAGNOSIS — F411 Generalized anxiety disorder: Secondary | ICD-10-CM | POA: Diagnosis not present

## 2021-01-05 DIAGNOSIS — E119 Type 2 diabetes mellitus without complications: Secondary | ICD-10-CM | POA: Diagnosis not present

## 2021-01-05 DIAGNOSIS — Z6831 Body mass index (BMI) 31.0-31.9, adult: Secondary | ICD-10-CM | POA: Diagnosis not present

## 2021-01-05 DIAGNOSIS — Z1331 Encounter for screening for depression: Secondary | ICD-10-CM | POA: Diagnosis not present

## 2021-01-11 DIAGNOSIS — E119 Type 2 diabetes mellitus without complications: Secondary | ICD-10-CM | POA: Diagnosis not present

## 2021-01-11 DIAGNOSIS — I10 Essential (primary) hypertension: Secondary | ICD-10-CM | POA: Diagnosis not present

## 2021-01-30 DIAGNOSIS — E119 Type 2 diabetes mellitus without complications: Secondary | ICD-10-CM | POA: Diagnosis not present

## 2021-02-08 ENCOUNTER — Ambulatory Visit: Payer: PPO

## 2021-02-09 ENCOUNTER — Ambulatory Visit (INDEPENDENT_AMBULATORY_CARE_PROVIDER_SITE_OTHER): Payer: PPO | Admitting: *Deleted

## 2021-02-09 DIAGNOSIS — Z91038 Other insect allergy status: Secondary | ICD-10-CM | POA: Diagnosis not present

## 2021-02-21 DIAGNOSIS — N5201 Erectile dysfunction due to arterial insufficiency: Secondary | ICD-10-CM | POA: Diagnosis not present

## 2021-02-21 DIAGNOSIS — N200 Calculus of kidney: Secondary | ICD-10-CM | POA: Diagnosis not present

## 2021-02-21 DIAGNOSIS — R3911 Hesitancy of micturition: Secondary | ICD-10-CM | POA: Diagnosis not present

## 2021-02-24 ENCOUNTER — Other Ambulatory Visit: Payer: Self-pay | Admitting: Cardiovascular Disease

## 2021-03-13 NOTE — Progress Notes (Signed)
Virtual Visit via Video Note   This visit type was conducted due to national recommendations for restrictions regarding the COVID-19 Pandemic (e.g. social distancing) in an effort to limit this patient's exposure and mitigate transmission in our community.  Due to her co-morbid illnesses, this patient is at least at moderate risk for complications without adequate follow up.  This format is felt to be most appropriate for this patient at this time.  All issues noted in this document were discussed and addressed.  A limited physical exam was performed with this format.  Please refer to the patient's chart for her consent to telehealth for [facility].   Patient Location: Home Physician Location: Office   Date:  03/16/2021   ID:  Alex Castillo, DOB 03-26-1946, MRN [id number]  PCP:  Townsend Roger, MD  Cardiologist:  Dr. Johnsie Cancel    History of Present Illness: Alex Castillo is a 75 y.o. male who presents for f/u of HTN, and palpitations.    Activity limited by chronic back issues but still tries to play tennis   Hx of 1st degree AV block.   Today.he feel great, no chest pain and no SOB.  No lightheadedness no palpitations.  He plays tennis freq.  Years ago he had 3 episodes of rapid HR and none since.  He is allergic to bees and had episode of arrest with initial sting.  Now carries epi pen.  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 Weight up eating a bit too much popcorn and his wife's [employer] Rx  Crestor making his bones hurt Discussed trying zocor M/W/F His wife Georg Ruddle is having chest pains and we will try to get her in to see me   Past Medical History:  Diagnosis Date   Adenomatous polyps     Anxiety    Arthritis    Asthma    BPH (benign prostatic hyperplasia)    Cancer ([place])    basal cell on nose   Complication of anesthesia    slow to wake up trouble voiding after a back surgery    Diabetes ([place])    Diverticulosis    ED (erectile dysfunction)    Esophageal stricture    First degree AV block    GERD (gastroesophageal reflux disease)    History of kidney stones    HTN (hypertension)    ETT negative 10/08   Hymenoptera allergy    Internal hemorrhoids    Palpitations    Staph infection    in right elbow   Unspecified gastritis and gastroduodenitis without mention of hemorrhage 02/19/2013    Past Surgical History:  Procedure Laterality Date   bilateral inguinal hernia repair     COLONOSCOPY W/ BIOPSIES AND POLYPECTOMY  09/15/2008   adonomatous polyps, diverticulosis, internal hemorrhoids   CYSTOSCOPY/RETROGRADE/URETEROSCOPY/STONE EXTRACTION WITH BASKET Left 12/08/2012   Procedure: CYSTOSCOPY, LEFT RETROGRADE, LEFT URETEROSCOPY/STONE EXTRACTION WITH BASKET;  Surgeon: Alexis Frock, MD;  Location: [facility];  Service: Urology;  Laterality: Left;   ESOPHAGOGASTRODUODENOSCOPY  02/19/2013   INGUINAL HERNIA REPAIR Left 07/25/2016   Procedure: LAPAROSCOPIC REPAIR RECURRENT LEFT INGUINAL HERNIA WITH MESH POSSIBLE OPEN;  Surgeon: Fanny Skates, MD;  Location: [facility];  Service: General;  Laterality: Left;   INSERTION OF MESH Left 07/25/2016   Procedure: INSERTION OF MESH;  Surgeon: Fanny Skates, MD;  Location: [facility];  Service: General;  Laterality: Left;   lower back surgery with lumbar vertebral repair     nasl septoplasty     right foot surgery     UPPER GASTROINTESTINAL ENDOSCOPY  02/07/2010   w/dil, tourtuous esophagus, hiatal hernia     Current Outpatient Medications  Medication Sig Dispense Refill   ADVAIR DISKUS 100-50 MCG/DOSE AEPB Inhale one puff one to two times daily as directed during asthma flare-ups.  Rinse, gargle, and spit after use. 60 each 11    albuterol (PROAIR HFA) 108 (90 Base) MCG/ACT inhaler Inhale two puffs every 4-6 hours if needed for cough or wheeze. 1 each 1   amLODipine (NORVASC) 10 MG tablet Take 1 tablet (10 mg total) by mouth daily. 90 tablet 3   Ascorbic Acid (VITAMIN C PO) Take by mouth daily.     aspirin 81 MG tablet Take 81 mg by mouth 2 (two) times daily.     Cholecalciferol (VITAMIN D3) 5000 units CAPS Take 10,000 Units by mouth daily.     clonazePAM (KLONOPIN) 0.5 MG tablet TAKE 1 TABLET BY MOUTH DAILY AS NEEDED 30 tablet 1   Coenzyme Q10 (COQ-10 PO) Take 100 mg by mouth. Take 2 daily     EPINEPHrine 0.3 mg/0.3 mL IJ SOAJ injection Use as directed for life-threatening allergic reaction. 2 each 3   GARLIC PO Take by mouth daily.     losartan (COZAAR) 50 MG tablet Take 2 tablets (100 mg total) by mouth daily. Please keep upcoming appt in November 2022 with Dr. Johnsie Cancel before anymore refills. Thank you Final Attempt 180 tablet 0   metFORMIN (GLUCOPHAGE-XR) 500 MG 24 hr tablet SMARTSIG:1 Tablet(s) By Mouth Every Evening     Multiple Vitamin (MULTIVITAMIN PO) Take by mouth daily.     Multiple Vitamins-Minerals (ZINC PO) Take by mouth daily.     nystatin-triamcinolone ointment (MYCOLOG) Apply 1 application topically 2 (two) times daily. 30 g 0   Omega-3 Fatty Acids (FISH OIL) 1200 MG CAPS Take 1,200 mg by mouth daily.     sertraline (ZOLOFT) 50 MG tablet Take 50 mg by mouth daily.     sildenafil (REVATIO) 20 MG tablet SMARTSIG:2-5 Tablet(s) By Mouth     Triamcinolone Acetonide (NASACORT ALLERGY 24HR NA) Place 2 sprays into the nose daily as needed (allergies).      TRUE METRIX BLOOD GLUCOSE TEST test strip daily.     TRUEplus Lancets 30G MISC CHECK BLOOD SUGAR ONCE DAILY     vitamin E 400 UNIT capsule Take 400 Units by mouth daily.     RABEprazole (ACIPHEX) 20 MG tablet TAKE ONE (1) TABLET ONCE DAILY (Patient not taking: Reported on 03/16/2021) 90 tablet 1   No current facility-administered medications for this visit.     Allergies:   Bee venom and Hydromorphone hcl    Social History:  The patient  reports that he has never smoked. He has never used smokeless tobacco. He reports that he does not drink alcohol and does not use drugs.   Family History:  The patient's family history includes Arthritis in his father; Asthma in his maternal grandfather; Emphysema in his father; Heart disease in his father; Hypertension in his mother; Liver disease in his daughter; Lung cancer in his father.    ROS:  General:no colds or fevers, no  weight changes Skin:no rashes or ulcers HEENT:no blurred vision, no congestion CV:see HPI PUL:see HPI GI:no diarrhea constipation or melena, no indigestion GU:no hematuria, no dysuria MS:no joint pain, no claudication Neuro:no syncope, no lightheadedness Endo:no diabetes, no thyroid disease  Wt Readings from Last 3 Encounters:  03/16/21 223 lb (101.2 kg)  03/14/21 223 lb (101.2 kg)  06/16/20 222 lb (100.7 kg)     PHYSICAL EXAM: VS:  BP 135/70   Pulse 65   Ht 5\' 10"  (1.778 m)   Wt 223 lb (101.2 kg)   BMI 32.00 kg/m  , BMI Body mass index is 32 kg/m.  Telephone no exam    EKG:  03/02/19 SR rate 65 PR 218 otherwise normal    Recent Labs: No results found for requested labs within last 8760 hours.    Lipid Panel    Component Value Date/Time   CHOL 161 04/09/2017 1101   TRIG 57 04/09/2017 1101   HDL 47 04/09/2017 1101   CHOLHDL 3.4 04/09/2017 1101   CHOLHDL 5 09/28/2009 0826   VLDL 18.6 09/28/2009 0826   LDLCALC 103 (H) 04/09/2017 1101       Other studies Reviewed: Additional studies/ records that were reviewed today include: previous OV notes.   ASSESSMENT AND PLAN:  1.  HTN Well controlled.  Continue current medications and low sodium Dash type diet.    2.  Palpitations.  No further episodes observe   3.  1st degree AV block - monitor. Yearly ECG none done today telephone visit will come to office for one since its been over 2 years   4. DM:   Newly diagnosed in November 2020  on glucophage A1c improved to 6.13 January 2020   5. HLD:  myalgias with crestor alternative non statin likely too expensive will try low dose zocor M/W/F and f/u labs in 3 months   The following changes have been made: D/c crestor try zocor 10 mg  Labs/ tests ordered today include: lipid and liver in 3 months   Disposition:   FU: in  6 months   I have spent 22 minutes reviewing chart direct patient interview and composing notes   Signed, Jenkins Rouge, MD  03/16/2021 9:15 AM    [facility] [place], [place], [place], [place] Phone: [phone]; Fax: [phone]

## 2021-03-14 ENCOUNTER — Encounter: Payer: Self-pay | Admitting: Internal Medicine

## 2021-03-14 ENCOUNTER — Ambulatory Visit (INDEPENDENT_AMBULATORY_CARE_PROVIDER_SITE_OTHER): Payer: PPO | Admitting: Internal Medicine

## 2021-03-14 VITALS — BP 140/70 | HR 74 | Ht 70.0 in | Wt 223.0 lb

## 2021-03-14 DIAGNOSIS — L309 Dermatitis, unspecified: Secondary | ICD-10-CM

## 2021-03-14 DIAGNOSIS — R1319 Other dysphagia: Secondary | ICD-10-CM

## 2021-03-14 DIAGNOSIS — K222 Esophageal obstruction: Secondary | ICD-10-CM

## 2021-03-14 DIAGNOSIS — K219 Gastro-esophageal reflux disease without esophagitis: Secondary | ICD-10-CM

## 2021-03-14 MED ORDER — NYSTATIN-TRIAMCINOLONE 100000-0.1 UNIT/GM-% EX OINT: 1.0000 "application " | TOPICAL_OINTMENT | Freq: Two times a day (BID) | CUTANEOUS | 0 refills | Status: DC

## 2021-03-14 NOTE — Patient Instructions (Addendum)
Use the nystatin-triamcinolone ointment x 2 weeks  Then always use an anti-fungal powder daily in the area and especially when going to exercise Zeasorb AF is one powder I often recommend  Avoid overwiping and rubbing towels on the anal area - use a hair dryer on low setting to dry  Check out Pristine toilet paper spray or Qleanse toilet paper spray Can find on [employer] have been scheduled for an endoscopy. Please follow written instructions given to you at your visit today. If you use inhalers (even only as needed), please bring them with you on the day of your procedure.   I appreciate the opportunity to care for you. Silvano Rusk, MD, [facility]

## 2021-03-14 NOTE — Progress Notes (Signed)
Alex Castillo 75 y.o. 08-14-1945 [id number]  Assessment & Plan:   Encounter Diagnoses  Name Primary?   Esophageal dysphagia Yes   Lower esophageal ring ([place])    Gastroesophageal reflux disease, unspecified whether esophagitis present    Perianal dermatitis     Schedule EGD with possible/probable dilation The risks and benefits as well as alternatives of endoscopic procedure(s) have been discussed and reviewed. All questions answered. The patient agrees to proceed.  Treat perianal dermatitis with Mycolog as below and then he should regularly use an antifungal powder and since he perspires there a lot.  He should also use less aggressive wiping and anal care i.e. hair dryer on low to dry and toilet paper spray to allow easier wiping. Meds ordered this encounter  Medications   nystatin-triamcinolone ointment (MYCOLOG)    Sig: Apply 1 application topically 2 (two) times daily.    Dispense:  30 g    Refill:  0   CC: Townsend Roger, MD   Subjective:   Chief Complaint: Reflux early satiety regurgitation/dysphagia  HPI  Alex Castillo is a 75 year old white man with a long history of dysphagia that responds to dilation, he has a muscular ring in the esophagus he also has a question of some dysmotility.  He comes in reporting an increase in symptoms of dysphagia again if he eats late at night he may have some nocturnal heartburn and he is also complaining of early satiety.  He says it seems like he may be ready for a dilation again the last one was in 2020  He is also noted the development of a spot of blood when he wipes fairly frequently often after he has been to the gym or playing tennis, when he sweats and perspires a lot.  Wt Readings from Last 3 Encounters:  03/14/21 223 lb (101.2 kg)  06/16/20 222 lb (100.7 kg)  03/02/20 211 lb (95.7 kg)  March 25, 2019 - Low-grade of narrowing Schatzki ring (/ some muscular component). Dilated. Biopsied to disrupt since maloney  dilation had no effect. No specimens collected. - Small sliding hiatal hernia. 1-2 cm - Erythematous mucosa in the prepyloric region of the stomach. - The examination was otherwise normal.  Colonoscopy 12/02/2013 1. Sessile polyp measuring 2 mm in size was found in the ascending colon; polypectomy was performed with cold forceps  - benign mucosa on pathology 2. Moderate diverticulosis was noted in the sigmoid colon 3. The colon mucosa was otherwise normal - excellent prep - hx adenoma 2010   Allergies  Allergen Reactions   Bee Venom Anaphylaxis   Hydromorphone Hcl Nausea And Vomiting   Current Meds  Medication Sig   ADVAIR DISKUS 100-50 MCG/DOSE AEPB Inhale one puff one to two times daily as directed during asthma flare-ups.  Rinse, gargle, and spit after use.   albuterol (PROAIR HFA) 108 (90 Base) MCG/ACT inhaler Inhale two puffs every 4-6 hours if needed for cough or wheeze.   amLODipine (NORVASC) 10 MG tablet Take 1 tablet (10 mg total) by mouth daily.   Ascorbic Acid (VITAMIN C PO) Take by mouth daily.   aspirin 81 MG tablet Take 81 mg by mouth 2 (two) times daily.   Cholecalciferol (VITAMIN D3) 5000 units CAPS Take 10,000 Units by mouth daily.   clonazePAM (KLONOPIN) 0.5 MG tablet TAKE 1 TABLET BY MOUTH DAILY AS NEEDED   Coenzyme Q10 (COQ-10 PO) Take 100 mg by mouth. Take 2 daily   EPINEPHrine 0.3 mg/0.3 mL IJ SOAJ injection  Use as directed for life-threatening allergic reaction.   GARLIC PO Take by mouth daily.   losartan (COZAAR) 50 MG tablet Take 2 tablets (100 mg total) by mouth daily. Please keep upcoming appt in November 2022 with Dr. Johnsie Cancel before anymore refills. Thank you Final Attempt   metFORMIN (GLUCOPHAGE-XR) 500 MG 24 hr tablet SMARTSIG:1 Tablet(s) By Mouth Every Evening   Multiple Vitamin (MULTIVITAMIN PO) Take by mouth daily.   Multiple Vitamins-Minerals (ZINC PO) Take by mouth daily.   Omega-3 Fatty Acids (FISH OIL) 1200 MG CAPS Take 1,200 mg by mouth daily.    RABEprazole (ACIPHEX) 20 MG tablet TAKE ONE (1) TABLET ONCE DAILY   sertraline (ZOLOFT) 50 MG tablet Take 50 mg by mouth daily.   sildenafil (REVATIO) 20 MG tablet SMARTSIG:2-5 Tablet(s) By Mouth   Triamcinolone Acetonide (NASACORT ALLERGY 24HR NA) Place 2 sprays into the nose daily as needed (allergies).    vitamin E 400 UNIT capsule Take 400 Units by mouth daily.   Past Medical History:  Diagnosis Date   Adenomatous polyps    Anxiety    Arthritis    Asthma    BPH (benign prostatic hyperplasia)    Cancer ([facility])    basal cell on nose   Complication of anesthesia    slow to wake up trouble voiding after a back surgery    Diabetes ([place])    Diverticulosis    ED (erectile dysfunction)    Esophageal stricture    First degree AV block    GERD (gastroesophageal reflux disease)    History of kidney stones    HTN (hypertension)    ETT negative 10/08   Hymenoptera allergy    Internal hemorrhoids    Palpitations    Staph infection    in right elbow   Unspecified gastritis and gastroduodenitis without mention of hemorrhage 02/19/2013   Past Surgical History:  Procedure Laterality Date   bilateral inguinal hernia repair     COLONOSCOPY W/ BIOPSIES AND POLYPECTOMY  09/15/2008   adonomatous polyps, diverticulosis, internal hemorrhoids   CYSTOSCOPY/RETROGRADE/URETEROSCOPY/STONE EXTRACTION WITH BASKET Left 12/08/2012   Procedure: CYSTOSCOPY, LEFT RETROGRADE, LEFT URETEROSCOPY/STONE EXTRACTION WITH BASKET;  Surgeon: Alexis Frock, MD;  [facility]: [facility];  Service: Urology;  Laterality: Left;   ESOPHAGOGASTRODUODENOSCOPY  02/19/2013   INGUINAL HERNIA REPAIR Left 07/25/2016   Procedure: LAPAROSCOPIC REPAIR RECURRENT LEFT INGUINAL HERNIA WITH MESH POSSIBLE OPEN;  Surgeon: Fanny Skates, MD;  Location: [facility];  Service: General;  Laterality: Left;   INSERTION OF MESH Left 07/25/2016   Procedure: INSERTION OF MESH;  Surgeon: Fanny Skates, MD;  Location: [facility];  Service: General;  Laterality: Left;    lower back surgery with lumbar vertebral repair     nasl septoplasty     right foot surgery     UPPER GASTROINTESTINAL ENDOSCOPY  02/07/2010   w/dil, tourtuous esophagus, hiatal hernia   Social History   Social History Narrative   Daily Caffeine use sodas and tea   family history includes Arthritis in his father; Asthma in his maternal grandfather; Emphysema in his father; Heart disease in his father; Hypertension in his mother; Liver disease in his daughter; Lung cancer in his father.   Review of Systems As above  Objective:   Physical Exam BP 140/70   Pulse 74   Ht 5\' 10"  (1.778 m)   Wt 223 lb (101.2 kg)   BMI 32.00 kg/m  Well-developed well-nourished white man in no acute distress Lungs clear Heart sounds are normal The abdomen  is soft and nontender bowel sounds are present  Digital rectal exam shows some mild anal stenosis but no mass and is nontender perianal exam is captured in the photos below, there is a perianal dermatitis and he has a small ulcer anteriorly in relation to the anus.

## 2021-03-16 ENCOUNTER — Other Ambulatory Visit: Payer: Self-pay

## 2021-03-16 ENCOUNTER — Telehealth (INDEPENDENT_AMBULATORY_CARE_PROVIDER_SITE_OTHER): Payer: PPO | Admitting: Cardiovascular Disease

## 2021-03-16 VITALS — BP 135/70 | HR 65 | Ht 70.0 in | Wt 223.0 lb

## 2021-03-16 DIAGNOSIS — I440 Atrioventricular block, first degree: Secondary | ICD-10-CM

## 2021-03-16 DIAGNOSIS — I10 Essential (primary) hypertension: Secondary | ICD-10-CM

## 2021-03-16 DIAGNOSIS — R002 Palpitations: Secondary | ICD-10-CM

## 2021-03-16 MED ORDER — SIMVASTATIN 5 MG PO TABS
5.0000 mg | ORAL_TABLET | ORAL | 3 refills | Status: DC
Start: 2021-03-17 — End: 2021-05-02

## 2021-03-16 MED ORDER — EPINEPHRINE 0.3 MG/0.3ML IJ SOAJ: INTRAMUSCULAR | 3 refills | Status: DC

## 2021-03-16 MED ORDER — LOSARTAN POTASSIUM 100 MG PO TABS: 100.0000 mg | ORAL_TABLET | Freq: Every day | ORAL | 3 refills | Status: DC

## 2021-03-16 MED ORDER — AMLODIPINE BESYLATE 10 MG PO TABS: 10.0000 mg | ORAL_TABLET | Freq: Every day | ORAL | 3 refills | Status: DC

## 2021-03-16 NOTE — Patient Instructions (Addendum)
Medication Instructions:  Your physician has recommended you make the following change in your medication:  1-STOP Crestor 2-START Simvastatin (Zocor) 5 mg by mouth on Monday, Wednesday, and Friday.  *If you need a refill on your cardiac medications before your next appointment, please call your pharmacy*  Lab Work: Your physician recommends that you return for lab work in: 3 months for fasting lipid and liver panel.  If you have labs (blood work) drawn today and your tests are completely normal, you will receive your results only by: [place] (if you have MyChart) OR A paper copy in the mail If you have any lab test that is abnormal or we need to change your treatment, we will call you to review the results.  Testing/Procedures: None ordered today.  Follow-Up: At [facility], you and your health needs are our priority.  As part of our continuing mission to provide you with exceptional heart care, we have created designated Provider Care Teams.  These Care Teams include your primary Cardiologist (physician) and Advanced Practice Providers (APPs -  Physician Assistants and Nurse Practitioners) who all work together to provide you with the care you need, when you need it.  We recommend signing up for the patient portal called "MyChart".  Sign up information is provided on this After Visit Summary.  MyChart is used to connect with patients for Virtual Visits (Telemedicine).  Patients are able to view lab/test results, encounter notes, upcoming appointments, etc.  Non-urgent messages can be sent to your provider as well.   To learn more about what you can do with MyChart, go to [email or web address].    Your next appointment:   6 month(s)  The format for your next appointment:   In Person  Provider:   You may see Dr. Johnsie Cancel or one of the following Advanced Practice Providers on your designated Care Team:   Cecilie Kicks, NP

## 2021-04-04 ENCOUNTER — Ambulatory Visit (INDEPENDENT_AMBULATORY_CARE_PROVIDER_SITE_OTHER): Payer: PPO | Admitting: *Deleted

## 2021-04-04 ENCOUNTER — Other Ambulatory Visit: Payer: Self-pay

## 2021-04-04 DIAGNOSIS — Z91038 Other insect allergy status: Secondary | ICD-10-CM | POA: Diagnosis not present

## 2021-04-05 ENCOUNTER — Ambulatory Visit: Payer: PPO

## 2021-05-02 ENCOUNTER — Other Ambulatory Visit: Payer: Self-pay

## 2021-05-02 ENCOUNTER — Encounter: Payer: Self-pay | Admitting: Internal Medicine

## 2021-05-02 ENCOUNTER — Ambulatory Visit (AMBULATORY_SURGERY_CENTER): Payer: PPO | Admitting: Internal Medicine

## 2021-05-02 VITALS — BP 128/75 | HR 73 | Temp 97.5°F | Resp 12 | Ht 70.0 in | Wt 233.0 lb

## 2021-05-02 DIAGNOSIS — E119 Type 2 diabetes mellitus without complications: Secondary | ICD-10-CM | POA: Diagnosis not present

## 2021-05-02 DIAGNOSIS — I10 Essential (primary) hypertension: Secondary | ICD-10-CM | POA: Diagnosis not present

## 2021-05-02 DIAGNOSIS — R1319 Other dysphagia: Secondary | ICD-10-CM | POA: Diagnosis not present

## 2021-05-02 DIAGNOSIS — K222 Esophageal obstruction: Secondary | ICD-10-CM | POA: Diagnosis not present

## 2021-05-02 DIAGNOSIS — K449 Diaphragmatic hernia without obstruction or gangrene: Secondary | ICD-10-CM | POA: Diagnosis not present

## 2021-05-02 DIAGNOSIS — K219 Gastro-esophageal reflux disease without esophagitis: Secondary | ICD-10-CM | POA: Diagnosis not present

## 2021-05-02 DIAGNOSIS — R1310 Dysphagia, unspecified: Secondary | ICD-10-CM | POA: Diagnosis not present

## 2021-05-02 DIAGNOSIS — J45909 Unspecified asthma, uncomplicated: Secondary | ICD-10-CM | POA: Diagnosis not present

## 2021-05-02 MED ORDER — SODIUM CHLORIDE 0.9 % IV SOLN: 500.0000 mL | Freq: Once | INTRAVENOUS | Status: DC

## 2021-05-02 NOTE — Progress Notes (Signed)
Called to room to assist during endoscopic procedure.  Patient ID and intended procedure confirmed with present staff. Received instructions for my participation in the procedure from the performing physician.  

## 2021-05-02 NOTE — Progress Notes (Signed)
Report to PACU, RN, vss, BBS= Clear.  

## 2021-05-02 NOTE — Progress Notes (Signed)
[facility] History and Physical   Primary Care Physician:  Townsend Roger, MD   Reason for Procedure:   dysphagia  Plan:    EGD, possible biosy, dilation     HPI: Alex Castillo is a 75 y.o. male w/ recurrent dysphagia issues that have responded to disruption of a muscular LE reng in past - here for reassesment and retreatment.   Past Medical History:  Diagnosis Date   Adenomatous polyps    Anxiety    Arthritis    Asthma    BPH (benign prostatic hyperplasia)    Cancer (HCC)    basal cell on nose   Complication of anesthesia    slow to wake up trouble voiding after a back surgery    Diabetes ([place])    Diverticulosis    ED (erectile dysfunction)    Esophageal stricture    First degree AV block    GERD (gastroesophageal reflux disease)    History of kidney stones    HTN (hypertension)    ETT negative 10/08   Hymenoptera allergy    Internal hemorrhoids    Palpitations    Staph infection    in right elbow   Unspecified gastritis and gastroduodenitis without mention of hemorrhage 02/19/2013    Past Surgical History:  Procedure Laterality Date   bilateral inguinal hernia repair     COLONOSCOPY W/ BIOPSIES AND POLYPECTOMY  09/15/2008   adonomatous polyps, diverticulosis, internal hemorrhoids   CYSTOSCOPY/RETROGRADE/URETEROSCOPY/STONE EXTRACTION WITH BASKET Left 12/08/2012   Procedure: CYSTOSCOPY, LEFT RETROGRADE, LEFT URETEROSCOPY/STONE EXTRACTION WITH BASKET;  Surgeon: Alexis Frock, MD;  Location: [facility];  Service: Urology;  Laterality: Left;   ESOPHAGOGASTRODUODENOSCOPY  02/19/2013   INGUINAL HERNIA REPAIR Left 07/25/2016   Procedure: LAPAROSCOPIC REPAIR RECURRENT LEFT INGUINAL HERNIA WITH MESH POSSIBLE OPEN;  Surgeon: Fanny Skates, MD;  Location: [facility];  Service: General;  Laterality: Left;   INSERTION OF MESH Left 07/25/2016   Procedure: INSERTION OF MESH;  Surgeon: Fanny Skates, MD;  Location: [facility];  Service: General;  Laterality: Left;   lower  back surgery with lumbar vertebral repair     nasl septoplasty     right foot surgery     UPPER GASTROINTESTINAL ENDOSCOPY  02/07/2010   w/dil, tourtuous esophagus, hiatal hernia    Prior to Admission medications   Medication Sig Start Date End Date Taking? Authorizing Provider  amLODipine (NORVASC) 10 MG tablet Take 1 tablet (10 mg total) by mouth daily. 03/16/21  Yes Josue Hector, MD  Ascorbic Acid (VITAMIN C PO) Take by mouth daily.   Yes [provider]  aspirin 81 MG tablet Take 81 mg by mouth 2 (two) times daily.   Yes [provider]  Cholecalciferol (VITAMIN D3) 5000 units CAPS Take 10,000 Units by mouth daily.   Yes [provider]  clonazePAM (KLONOPIN) 0.5 MG tablet TAKE 1 TABLET BY MOUTH DAILY AS NEEDED 11/09/19  Yes Gatha Mayer, MD  Coenzyme Q10 (COQ-10 PO) Take 100 mg by mouth. Take 2 daily   Yes [provider]  GARLIC PO Take by mouth daily.   Yes [provider]  losartan (COZAAR) 100 MG tablet Take 1 tablet (100 mg total) by mouth daily. 03/16/21  Yes Josue Hector, MD  metFORMIN (GLUCOPHAGE-XR) 500 MG 24 hr tablet SMARTSIG:1 Tablet(s) By Mouth Every Evening 04/12/20  Yes [provider]  Multiple Vitamin (MULTIVITAMIN PO) Take by mouth daily.   Yes [provider]  Multiple Vitamins-Minerals (ZINC PO)  Take by mouth daily.   Yes [provider]  Omega-3 Fatty Acids (FISH OIL) 1200 MG CAPS Take 1,200 mg by mouth daily.   Yes [provider]  RABEprazole (ACIPHEX) 20 MG tablet TAKE ONE (1) TABLET ONCE DAILY 06/20/20  Yes Gatha Mayer, MD  rosuvastatin (CRESTOR) 5 MG tablet Take 5 mg by mouth daily.   Yes [provider]  sertraline (ZOLOFT) 50 MG tablet Take 50 mg by mouth daily. 01/20/20  Yes [provider]  Triamcinolone Acetonide (NASACORT ALLERGY 24HR NA) Place 2 sprays into the nose daily as needed (allergies).    Yes [provider]  vitamin E 400 UNIT  capsule Take 400 Units by mouth daily.   Yes [provider]  ADVAIR DISKUS 100-50 MCG/DOSE AEPB Inhale one puff one to two times daily as directed during asthma flare-ups.  Rinse, gargle, and spit after use. 06/16/20   Kozlow, Donnamarie Poag, MD  albuterol (PROAIR HFA) 108 (90 Base) MCG/ACT inhaler Inhale two puffs every 4-6 hours if needed for cough or wheeze. 06/16/20   Kozlow, Donnamarie Poag, MD  EPINEPHrine 0.3 mg/0.3 mL IJ SOAJ injection Use as directed for life-threatening allergic reaction. 03/16/21   Josue Hector, MD  nystatin-triamcinolone ointment [facility]) Apply 1 application topically 2 (two) times daily. 03/14/21   Gatha Mayer, MD  sildenafil (REVATIO) 20 MG tablet SMARTSIG:2-5 Tablet(s) By Mouth 05/20/20   [provider]  TRUE METRIX BLOOD GLUCOSE TEST test strip daily. 12/20/20   [provider]  TRUEplus Lancets 30G MISC CHECK BLOOD SUGAR ONCE DAILY 12/20/20   [provider]    Current Outpatient Medications  Medication Sig Dispense Refill   amLODipine (NORVASC) 10 MG tablet Take 1 tablet (10 mg total) by mouth daily. 90 tablet 3   Ascorbic Acid (VITAMIN C PO) Take by mouth daily.     aspirin 81 MG tablet Take 81 mg by mouth 2 (two) times daily.     Cholecalciferol (VITAMIN D3) 5000 units CAPS Take 10,000 Units by mouth daily.     clonazePAM (KLONOPIN) 0.5 MG tablet TAKE 1 TABLET BY MOUTH DAILY AS NEEDED 30 tablet 1   Coenzyme Q10 (COQ-10 PO) Take 100 mg by mouth. Take 2 daily     GARLIC PO Take by mouth daily.     losartan (COZAAR) 100 MG tablet Take 1 tablet (100 mg total) by mouth daily. 90 tablet 3   metFORMIN (GLUCOPHAGE-XR) 500 MG 24 hr tablet SMARTSIG:1 Tablet(s) By Mouth Every Evening     Multiple Vitamin (MULTIVITAMIN PO) Take by mouth daily.     Multiple Vitamins-Minerals (ZINC PO) Take by mouth daily.     Omega-3 Fatty Acids (FISH OIL) 1200 MG CAPS Take 1,200 mg by mouth daily.     RABEprazole (ACIPHEX) 20 MG tablet TAKE ONE (1) TABLET ONCE DAILY  90 tablet 1   rosuvastatin (CRESTOR) 5 MG tablet Take 5 mg by mouth daily.     sertraline (ZOLOFT) 50 MG tablet Take 50 mg by mouth daily.     Triamcinolone Acetonide (NASACORT ALLERGY 24HR NA) Place 2 sprays into the nose daily as needed (allergies).      vitamin E 400 UNIT capsule Take 400 Units by mouth daily.     ADVAIR DISKUS 100-50 MCG/DOSE AEPB Inhale one puff one to two times daily as directed during asthma flare-ups.  Rinse, gargle, and spit after use. 60 each 11   albuterol (PROAIR HFA) 108 (90 Base) MCG/ACT inhaler Inhale  two puffs every 4-6 hours if needed for cough or wheeze. 1 each 1   EPINEPHrine 0.3 mg/0.3 mL IJ SOAJ injection Use as directed for life-threatening allergic reaction. 2 each 3   nystatin-triamcinolone ointment (MYCOLOG) Apply 1 application topically 2 (two) times daily. 30 g 0   sildenafil (REVATIO) 20 MG tablet SMARTSIG:2-5 Tablet(s) By Mouth     TRUE METRIX BLOOD GLUCOSE TEST test strip daily.     TRUEplus Lancets 30G MISC CHECK BLOOD SUGAR ONCE DAILY     Current Facility-Administered Medications  Medication Dose Route Frequency Provider Last Rate Last Admin   0.9 %  sodium chloride infusion  500 mL Intravenous Once Gatha Mayer, MD        Allergies as of 05/02/2021 - Review Complete 05/02/2021  Allergen Reaction Noted   Bee venom Anaphylaxis 09/24/2012   Hydromorphone hcl Nausea And Vomiting     Family History  Problem Relation Age of Onset   Lung cancer Father    Heart disease Father    Arthritis Father    Emphysema Father    Hypertension Mother    Asthma Maternal Grandfather    Liver disease Daughter        Primary Sclerosisng cholangitis   Colon cancer Neg Hx    Throat cancer Neg Hx    Kidney disease Neg Hx    Stomach cancer Neg Hx    Rectal cancer Neg Hx    Esophageal cancer Neg Hx     Social History   Socioeconomic History   Marital status: Married    Spouse name: Not on file   Number of children: 2   Years of education: Not on  file   Highest education level: Not on file  Occupational History   Occupation: Retired  Tobacco Use   Smoking status: Never   Smokeless tobacco: Never  Vaping Use   Vaping Use: Never used  Substance and Sexual Activity   Alcohol use: No   Drug use: No   Sexual activity: Yes  Other Topics Concern     Social History Narrative   Daily Caffeine use sodas and tea    Review of Systems:  other review of systems negative except as mentioned in the HPI.  Physical Exam: Vital signs BP (!) 170/98    Pulse 70    Temp (!) 97.5 F (36.4 C)    Ht 5\' 10"  (1.778 m)    Wt 233 lb (105.7 kg)    SpO2 96%    BMI 33.43 kg/m   General:   Alert,  Well-developed, well-nourished, pleasant and cooperative in NAD Lungs:  Clear throughout to auscultation.   Heart:  Regular rate and rhythm; no murmurs, clicks, rubs,  or gallops. Abdomen:  Soft, nontender and nondistended. Normal bowel sounds.   Neuro/Psych:  Alert and cooperative. Normal mood and affect. A and O x 3   @Kaydra Borgen  Simonne Maffucci, MD, [facility] [phone] (pager) 05/02/2021 7:57 AM@

## 2021-05-02 NOTE — Op Note (Signed)
[facility] Patient Name: Alex Castillo Procedure Date: 05/02/2021 7:55 AM MRN: [id number] Endoscopist: Gatha Mayer , MD Age: 75 Referring MD:  Date of Birth: Apr 10, 1946 Gender: Male Account #: [id number] Procedure:                Upper GI endoscopy Indications:              Dysphagia Medicines:                Propofol per Anesthesia, Monitored Anesthesia Care Procedure:                Pre-Anesthesia Assessment:                           - Prior to the procedure, a History and Physical                            was performed, and patient medications and                            allergies were reviewed. The patient's tolerance of                            previous anesthesia was also reviewed. The risks                            and benefits of the procedure and the sedation                            options and risks were discussed with the patient.                            All questions were answered, and informed consent                            was obtained. Prior Anticoagulants: The patient has                            taken no previous anticoagulant or antiplatelet                            agents. ASA Grade Assessment: II - A patient with                            mild systemic disease. After reviewing the risks                            and benefits, the patient was deemed in                            satisfactory condition to undergo the procedure.                           After obtaining informed consent, the endoscope was  passed under direct vision. Throughout the                            procedure, the patient's blood pressure, pulse, and                            oxygen saturations were monitored continuously. The                            GIF [id number] #[id number] was introduced through the                            mouth, and advanced to the second part of duodenum.                            The upper GI endoscopy  was accomplished without                            difficulty. The patient tolerated the procedure                            well. Scope In: Scope Out: Findings:                 A moderate ring was found at the gastroesophageal                            junction. The scope was withdrawn. Dilation was                            performed with a Maloney dilator with mild                            resistance at 33 Fr. The dilation site was examined                            following endoscope reinsertion and showed no                            change. Estimated blood loss: none. This was                            biopsied with a cold forceps for ring disruption.                            No biopsies or other specimens were collected for                            this exam.                           A small sliding hiatal hernia was found.                           Patchy mildly erythematous  mucosa was found in the                            prepyloric region of the stomach.                           The gastroesophageal flap valve was visualized                            endoscopically and classified as Hill Grade III                            (minimal fold, loose to endoscope, hiatal hernia                            likely).                           The cardia and gastric fundus were normal on                            retroflexion.                           The exam was otherwise without abnormality. Complications:            No immediate complications. Estimated Blood Loss:     Estimated blood loss was minimal. Impression:               - Moderate muscular ring as before. Dilated 54 Fr                            no effect. Biopsied for disruption as before. No                            specimens collected.                           - Small sliding hiatal hernia.                           - Erythematous mucosa in the prepyloric region of                            the  stomach.                           - Gastroesophageal flap valve classified as Hill                            Grade III (minimal fold, loose to endoscope, hiatal                            hernia likely).                           - The examination was otherwise normal. Recommendation:           -  Patient has a contact number available for                            emergencies. The signs and symptoms of potential                            delayed complications were discussed with the                            patient. Return to normal activities tomorrow.                            Written discharge instructions were provided to the                            patient.                           - Clear liquids x 1 hour then soft foods rest of                            day. Start prior diet tomorrow.                           - Continue present medications.                           - Return as needed Gatha Mayer, MD 05/02/2021 8:19:44 AM This report has been signed electronically.

## 2021-05-02 NOTE — Progress Notes (Signed)
VS by AG  Pt's states no medical or surgical changes since previsit or office visit.  

## 2021-05-02 NOTE — Patient Instructions (Addendum)
YOU HAD AN ENDOSCOPIC PROCEDURE TODAY AT [facility]:   Refer to the procedure report that was given to you for any specific questions about what was found during the examination.  If the procedure report does not answer your questions, please call your gastroenterologist to clarify.  If you requested that your care partner not be given the details of your procedure findings, then the procedure report has been included in a sealed envelope for you to review at your convenience later.  YOU SHOULD EXPECT: Some feelings of bloating in the abdomen. Passage of more gas than usual.  Walking can help get rid of the air that was put into your GI tract during the procedure and reduce the bloating. If you had a lower endoscopy (such as a colonoscopy or flexible sigmoidoscopy) you may notice spotting of blood in your stool or on the toilet paper. If you underwent a bowel prep for your procedure, you may not have a normal bowel movement for a few days.  Please Note:  You might notice some irritation and congestion in your nose or some drainage.  This is from the oxygen used during your procedure.  There is no need for concern and it should clear up in a day or so.  SYMPTOMS TO REPORT IMMEDIATELY:  Following upper endoscopy (EGD)  Vomiting of blood or coffee ground material  New chest pain or pain under the shoulder blades  Painful or persistently difficult swallowing  New shortness of breath  Fever of 100F or higher  Black, tarry-looking stools  For urgent or emergent issues, a gastroenterologist can be reached at any hour by calling [phone]. Do not use MyChart messaging for urgent concerns.    DIET:  We do recommend a small meal at first, but then you may proceed to your regular diet.  Drink plenty of fluids but you should avoid alcoholic beverages for 24 hours.  ACTIVITY:  You should plan to take it easy for the rest of today and you should NOT DRIVE or use heavy machinery until  tomorrow (because of the sedation medicines used during the test).    FOLLOW UP: Our staff will call the number listed on your records 48-72 hours following your procedure to check on you and address any questions or concerns that you may have regarding the information given to you following your procedure. If we do not reach you, we will leave a message.  We will attempt to reach you two times.  During this call, we will ask if you have developed any symptoms of COVID 19. If you develop any symptoms (ie: fever, flu-like symptoms, shortness of breath, cough etc.) before then, please call [phone].  If you test positive for Covid 19 in the 2 weeks post procedure, please call and report this information to [place].    If any biopsies were taken you will be contacted by phone or by letter within the next 1-3 weeks.  Please call us at [phone] if you have not heard about the biopsies in 3 weeks.    SIGNATURES/CONFIDENTIALITY: You and/or your care partner have signed paperwork which will be entered into your electronic medical record.  These signatures attest to the fact that that the information above on your After Visit Summary has been reviewed and is understood.  Full responsibility of the confidentiality of this discharge information lies with you and/or your care-partner. Things basically look the same. I dilated and opened up the ring again.  I  appreciate the opportunity to care for you. Gatha Mayer, MD, [facility]

## 2021-05-04 ENCOUNTER — Telehealth: Payer: Self-pay | Admitting: *Deleted

## 2021-05-04 ENCOUNTER — Telehealth: Payer: Self-pay

## 2021-05-04 NOTE — Telephone Encounter (Signed)
Attempted f/u phone call. No answer. Left message. °

## 2021-05-04 NOTE — Telephone Encounter (Signed)
Second attempt follow up call to pt, no answer.  

## 2021-05-18 ENCOUNTER — Other Ambulatory Visit: Payer: Self-pay | Admitting: Allergy and Immunology

## 2021-05-18 ENCOUNTER — Other Ambulatory Visit: Payer: Self-pay | Admitting: Internal Medicine

## 2021-05-18 DIAGNOSIS — E119 Type 2 diabetes mellitus without complications: Secondary | ICD-10-CM | POA: Diagnosis not present

## 2021-05-18 DIAGNOSIS — E7800 Pure hypercholesterolemia, unspecified: Secondary | ICD-10-CM | POA: Diagnosis not present

## 2021-05-18 DIAGNOSIS — I10 Essential (primary) hypertension: Secondary | ICD-10-CM | POA: Diagnosis not present

## 2021-05-22 ENCOUNTER — Other Ambulatory Visit: Payer: Self-pay | Admitting: Cardiovascular Disease

## 2021-05-23 MED ORDER — LOSARTAN POTASSIUM 100 MG PO TABS: 100.0000 mg | ORAL_TABLET | Freq: Every day | ORAL | 3 refills | Status: DC

## 2021-05-30 ENCOUNTER — Other Ambulatory Visit: Payer: Self-pay

## 2021-05-30 ENCOUNTER — Ambulatory Visit (INDEPENDENT_AMBULATORY_CARE_PROVIDER_SITE_OTHER): Payer: PPO

## 2021-05-30 DIAGNOSIS — Z91038 Other insect allergy status: Secondary | ICD-10-CM

## 2021-06-20 ENCOUNTER — Other Ambulatory Visit: Payer: Self-pay

## 2021-06-20 ENCOUNTER — Other Ambulatory Visit: Payer: PPO | Admitting: *Deleted

## 2021-06-20 DIAGNOSIS — I10 Essential (primary) hypertension: Secondary | ICD-10-CM

## 2021-06-20 DIAGNOSIS — R002 Palpitations: Secondary | ICD-10-CM | POA: Diagnosis not present

## 2021-06-20 DIAGNOSIS — I440 Atrioventricular block, first degree: Secondary | ICD-10-CM | POA: Diagnosis not present

## 2021-06-20 LAB — HEPATIC FUNCTION PANEL
ALT: 18 IU/L (ref 0–44)
AST: 18 IU/L (ref 0–40)
Albumin: 4.3 g/dL (ref 3.7–4.7)
Alkaline Phosphatase: 65 IU/L (ref 44–121)
Bilirubin Total: 0.6 mg/dL (ref 0.0–1.2)
Bilirubin, Direct: 0.17 mg/dL (ref 0.00–0.40)
Total Protein: 6.9 g/dL (ref 6.0–8.5)

## 2021-06-20 LAB — LIPID PANEL
Chol/HDL Ratio: 3.5 ratio (ref 0.0–5.0)
Cholesterol, Total: 140 mg/dL (ref 100–199)
HDL: 40 mg/dL (ref >39)
LDL Chol Calc (NIH): 85 mg/dL (ref 0–99)
Triglycerides: 79 mg/dL (ref 0–149)
VLDL Cholesterol Cal: 15 mg/dL (ref 5–40)

## 2021-06-28 ENCOUNTER — Other Ambulatory Visit: Payer: Self-pay

## 2021-06-28 ENCOUNTER — Encounter: Payer: Self-pay | Admitting: Allergy and Immunology

## 2021-06-28 ENCOUNTER — Ambulatory Visit: Payer: PPO | Admitting: Allergy and Immunology

## 2021-06-28 VITALS — BP 130/82 | HR 69 | Resp 16 | Ht 69.3 in | Wt 230.0 lb

## 2021-06-28 DIAGNOSIS — Z794 Long term (current) use of insulin: Secondary | ICD-10-CM | POA: Diagnosis not present

## 2021-06-28 DIAGNOSIS — E119 Type 2 diabetes mellitus without complications: Secondary | ICD-10-CM

## 2021-06-28 DIAGNOSIS — J4540 Moderate persistent asthma, uncomplicated: Secondary | ICD-10-CM

## 2021-06-28 DIAGNOSIS — T63481A Toxic effect of venom of other arthropod, accidental (unintentional), initial encounter: Secondary | ICD-10-CM

## 2021-06-28 DIAGNOSIS — J3089 Other allergic rhinitis: Secondary | ICD-10-CM

## 2021-06-28 MED ORDER — FLUTICASONE-SALMETEROL 100-50 MCG/ACT IN AEPB: INHALATION_SPRAY | RESPIRATORY_TRACT | 11 refills | Status: DC

## 2021-06-28 MED ORDER — EPINEPHRINE 0.3 MG/0.3ML IJ SOAJ: INTRAMUSCULAR | 3 refills | Status: AC

## 2021-06-28 NOTE — Patient Instructions (Addendum)
°  1.  Continue immunotherapy and EpiPen  2.  Can use Advair 100-1 inhalation 1-2 times per day during periods of lower airway symptoms  3.  Can use Nasacort-1-2 sprays each nostril 1 time per day during periods of upper airway symptoms  4.  If needed:   A.  Albuterol HFA-2 inhalations every 4-6 hours  B.  OTC antihistamine  5.  Paxlovid for Covid infection. Tamiflu for influenza infection  6.  Ozempic???  7.  Return to clinic in 1 year or earlier if problem

## 2021-06-28 NOTE — Progress Notes (Signed)
[place]   Follow-up Note  Referring Provider: Townsend Roger, MD Primary Provider: Nona Castillo, Alex Cornea, MD Date of Office Visit: 06/28/2021  Subjective:   Alex Castillo (DOB: Aug 31, 1945) is a 76 y.o. male who returns to the [facility] on 06/28/2021 in re-evaluation of the following:  HPI: [name] returns to this clinic in evaluation of hymenoptera venom hypersensitivity state, intermittent asthma, and allergic rhinitis.  His last visit to this clinic was 16 June 2020.  His immunotherapy is currently at every 8 weeks without any adverse effect.  He continues to carry an EpiPen.  Asthma has been under excellent control with intermittent and rare use of his controller agents and a short acting bronchodilator.  He has not required a systemic steroid to treat an exacerbation.  His nose is doing very well while using Nasacort about 3 times per week.  He has not required antibiotics to treat an episode of sinusitis.  He informs me that he has been having some issues with his diabetes even though he uses metformin.  His hemoglobin A1c is about 6.1%.  He has gained some weight recently.  He has received 3 COVID vaccines and this year's flu vaccine.  Allergies as of 06/28/2021       Reactions   Bee Venom Anaphylaxis   Hydromorphone Hcl Nausea And Vomiting        Medication List    Advair Diskus 100-50 MCG/ACT Aepb Generic drug: fluticasone-salmeterol Inhale one puff one to two times daily as directed during asthma flare-ups.  Rinse, gargle, and spit after use.   albuterol 108 (90 Base) MCG/ACT inhaler Commonly known as: ProAir HFA Inhale two puffs every 4-6 hours if needed for cough or wheeze.   amLODipine 10 MG tablet Commonly known as: NORVASC Take 1 tablet (10 mg total) by mouth daily.   aspirin 81 MG tablet Take 81 mg by mouth 2 (two) times daily.   clonazePAM 0.5 MG tablet Commonly known as:  KLONOPIN TAKE 1 TABLET BY MOUTH DAILY AS NEEDED   COQ-10 PO Take 100 mg by mouth. Take 2 daily   EPINEPHrine 0.3 mg/0.3 mL Soaj injection Commonly known as: EPI-PEN Use as directed for life-threatening allergic reaction.   Fish Oil 1200 MG Caps Take 1,200 mg by mouth daily.   GARLIC PO Take by mouth daily.   losartan 100 MG tablet Commonly known as: COZAAR Take 1 tablet (100 mg total) by mouth daily.   metFORMIN 500 MG 24 hr tablet Commonly known as: GLUCOPHAGE-XR SMARTSIG:1 Tablet(s) By Mouth Every Evening   MULTIVITAMIN PO Take by mouth daily.   NASACORT ALLERGY 24HR NA Place 2 sprays into the nose daily as needed (allergies).   nystatin-triamcinolone ointment Commonly known as: MYCOLOG Apply 1 application topically 2 (two) times daily.   RABEprazole 20 MG tablet Commonly known as: ACIPHEX TAKE 1 TABLET BY MOUTH EVERY DAY   rosuvastatin 5 MG tablet Commonly known as: CRESTOR Take 5 mg by mouth daily.   sertraline 50 MG tablet Commonly known as: ZOLOFT Take 50 mg by mouth daily.   sildenafil 20 MG tablet Commonly known as: REVATIO SMARTSIG:2-5 Tablet(s) By Mouth   True Metrix Blood Glucose Test test strip Generic drug: glucose blood daily.   TRUEplus Lancets 30G Misc CHECK BLOOD SUGAR ONCE DAILY   VITAMIN C PO Take by mouth daily.   Vitamin D3 125 MCG (5000 UT) Caps Take 10,000 Units by mouth daily.  vitamin E 180 MG (400 UNITS) capsule Take 400 Units by mouth daily.   ZINC PO Take by mouth daily.    Past Medical History:  Diagnosis Date   Adenomatous polyps    Anxiety    Arthritis    Asthma    BPH (benign prostatic hyperplasia)    Cancer ([facility])    basal cell on nose   Complication of anesthesia    slow to wake up trouble voiding after a back surgery    Diabetes ([place])    Diverticulosis    ED (erectile dysfunction)    Esophageal stricture    First degree AV block    GERD (gastroesophageal reflux disease)    History of kidney  stones    HTN (hypertension)    ETT negative 10/08   Hymenoptera allergy    Internal hemorrhoids    Palpitations    Staph infection    in right elbow   Unspecified gastritis and gastroduodenitis without mention of hemorrhage 02/19/2013    Past Surgical History:  Procedure Laterality Date   bilateral inguinal hernia repair     COLONOSCOPY W/ BIOPSIES AND POLYPECTOMY  09/15/2008   adonomatous polyps, diverticulosis, internal hemorrhoids   CYSTOSCOPY/RETROGRADE/URETEROSCOPY/STONE EXTRACTION WITH BASKET Left 12/08/2012   Procedure: CYSTOSCOPY, LEFT RETROGRADE, LEFT URETEROSCOPY/STONE EXTRACTION WITH BASKET;  Surgeon: Alex Frock, MD;  Location: [facility];  Service: Urology;  Laterality: Left;   ESOPHAGOGASTRODUODENOSCOPY  02/19/2013   INGUINAL HERNIA REPAIR Left 07/25/2016   Procedure: LAPAROSCOPIC REPAIR RECURRENT LEFT INGUINAL HERNIA WITH MESH POSSIBLE OPEN;  Surgeon: Alex Skates, MD;  Location: [facility];  Service: General;  Laterality: Left;   INSERTION OF MESH Left 07/25/2016   Procedure: INSERTION OF MESH;  Surgeon: Alex Skates, MD;  Location: [facility];  Service: General;  Laterality: Left;   lower back surgery with lumbar vertebral repair     nasl septoplasty     right foot surgery     UPPER GASTROINTESTINAL ENDOSCOPY  02/07/2010   w/dil, tourtuous esophagus, hiatal hernia    Review of systems negative except as noted in HPI / PMHx or noted below:  Review of Systems  Constitutional: Negative.   HENT: Negative.    Eyes: Negative.   Respiratory: Negative.    Cardiovascular: Negative.   Gastrointestinal: Negative.   Genitourinary: Negative.   Musculoskeletal: Negative.   Skin: Negative.   Neurological: Negative.   Endo/Heme/Allergies: Negative.   Psychiatric/Behavioral: Negative.      Objective:   Vitals:   06/28/21 0815  BP: 130/82  Pulse: 69  Resp: 16  SpO2: 95%   Height: 5' 9.3" (176 cm)  Weight: 230 lb (104.3 kg)   Physical Exam Constitutional:       Appearance: He is not diaphoretic.  HENT:     Head: Normocephalic.     Right Ear: Tympanic membrane, ear canal and external ear normal.     Left Ear: Tympanic membrane, ear canal and external ear normal.     Nose: Nose normal. No mucosal edema or rhinorrhea.     Mouth/Throat:     Pharynx: Uvula midline. No oropharyngeal exudate.  Eyes:     Conjunctiva/sclera: Conjunctivae normal.  Neck:     Thyroid: No thyromegaly.     Trachea: Trachea normal. No tracheal tenderness or tracheal deviation.  Cardiovascular:     Rate and Rhythm: Normal rate and regular rhythm.     Heart sounds: Normal heart sounds, S1 normal and S2 normal. No murmur heard. Pulmonary:     Effort:  No respiratory distress.     Breath sounds: Normal breath sounds. No stridor. No wheezing or rales.  Lymphadenopathy:     Head:     Right side of head: No tonsillar adenopathy.     Left side of head: No tonsillar adenopathy.     Cervical: No cervical adenopathy.  Skin:    Findings: No erythema or rash.     Nails: There is no clubbing.  Neurological:     Mental Status: He is alert.    Diagnostics:    Spirometry was performed and demonstrated an FEV1 of 1.78 at 60 % of predicted.  Assessment and Plan:   1. Allergic reaction to hymenoptera venom   2. Asthma, moderate persistent, well-controlled   3. Perennial allergic rhinitis   4. Type 2 diabetes mellitus without complication, with long-term current use of insulin ([place])     1.  Continue immunotherapy and EpiPen  2.  Can use Advair 100-1 inhalation 1-2 times per day during periods of lower airway symptoms  3.  Can use Nasacort-1-2 sprays each nostril 1 time per day during periods of upper airway symptoms  4.  If needed:   A.  Albuterol HFA-2 inhalations every 4-6 hours  B.  OTC antihistamine  5.  Paxlovid for Covid infection. Tamiflu for influenza infection  6.  Ozempic???  7.  Return to clinic in 1 year or earlier if problem  Overall Marquavion is doing very  well with his immunotherapy directed against hymenoptera venom and his respiratory tract issue is under very good control with intermittent use of controller agents.  He has been having some issues with his diabetes and may be he would benefit from switching his metformin to Ozempic and he can discuss this with his primary care doctor.  I will see him back in this clinic in 1 year or earlier if there is a problem.  Allena Katz, MD Allergy / Immunology [place]

## 2021-06-29 ENCOUNTER — Encounter: Payer: Self-pay | Admitting: Allergy and Immunology

## 2021-07-25 ENCOUNTER — Ambulatory Visit (INDEPENDENT_AMBULATORY_CARE_PROVIDER_SITE_OTHER): Payer: PPO | Admitting: *Deleted

## 2021-07-25 ENCOUNTER — Other Ambulatory Visit: Payer: Self-pay

## 2021-07-25 DIAGNOSIS — Z91038 Other insect allergy status: Secondary | ICD-10-CM | POA: Diagnosis not present

## 2021-08-16 DIAGNOSIS — Z08 Encounter for follow-up examination after completed treatment for malignant neoplasm: Secondary | ICD-10-CM | POA: Diagnosis not present

## 2021-08-16 DIAGNOSIS — D225 Melanocytic nevi of trunk: Secondary | ICD-10-CM | POA: Diagnosis not present

## 2021-08-16 DIAGNOSIS — R229 Localized swelling, mass and lump, unspecified: Secondary | ICD-10-CM | POA: Diagnosis not present

## 2021-08-16 DIAGNOSIS — Z85828 Personal history of other malignant neoplasm of skin: Secondary | ICD-10-CM | POA: Diagnosis not present

## 2021-08-16 DIAGNOSIS — L570 Actinic keratosis: Secondary | ICD-10-CM | POA: Diagnosis not present

## 2021-08-16 DIAGNOSIS — D229 Melanocytic nevi, unspecified: Secondary | ICD-10-CM | POA: Diagnosis not present

## 2021-08-16 DIAGNOSIS — L821 Other seborrheic keratosis: Secondary | ICD-10-CM | POA: Diagnosis not present

## 2021-08-16 DIAGNOSIS — D485 Neoplasm of uncertain behavior of skin: Secondary | ICD-10-CM | POA: Diagnosis not present

## 2021-08-16 DIAGNOSIS — L578 Other skin changes due to chronic exposure to nonionizing radiation: Secondary | ICD-10-CM | POA: Diagnosis not present

## 2021-08-16 DIAGNOSIS — C44519 Basal cell carcinoma of skin of other part of trunk: Secondary | ICD-10-CM | POA: Diagnosis not present

## 2021-08-17 DIAGNOSIS — E119 Type 2 diabetes mellitus without complications: Secondary | ICD-10-CM | POA: Diagnosis not present

## 2021-08-17 DIAGNOSIS — E7800 Pure hypercholesterolemia, unspecified: Secondary | ICD-10-CM | POA: Diagnosis not present

## 2021-08-17 DIAGNOSIS — I10 Essential (primary) hypertension: Secondary | ICD-10-CM | POA: Diagnosis not present

## 2021-08-28 DIAGNOSIS — C44519 Basal cell carcinoma of skin of other part of trunk: Secondary | ICD-10-CM | POA: Diagnosis not present

## 2021-08-28 DIAGNOSIS — D485 Neoplasm of uncertain behavior of skin: Secondary | ICD-10-CM | POA: Diagnosis not present

## 2021-08-28 DIAGNOSIS — L989 Disorder of the skin and subcutaneous tissue, unspecified: Secondary | ICD-10-CM | POA: Diagnosis not present

## 2021-08-30 DIAGNOSIS — H524 Presbyopia: Secondary | ICD-10-CM | POA: Diagnosis not present

## 2021-08-30 DIAGNOSIS — E119 Type 2 diabetes mellitus without complications: Secondary | ICD-10-CM | POA: Diagnosis not present

## 2021-09-04 NOTE — Progress Notes (Signed)
? ? ?Date:  09/14/2021  ? ?ID:  Alex Castillo, DOB 10/13/1945, MRN [id number] ? ?PCP:  Townsend Roger, MD  ?Cardiologist:  Dr. Johnsie Cancel   ? ?History of Present Illness: ?Alex Castillo is a 76 y.o. male who presents for f/u of HTN, and palpitations.   ? ?Activity limited by chronic back issues but still tries to play tennis   Hx of 1st degree AV block.  ? ?Years ago he had 3 episodes of rapid HR and none since.  He is allergic to bees and had episode of arrest with initial sting.  Now carries epi pen.  Some white coat HTN ?  ?Diagnosed with DM in November 2020 and lost a lot of weight A1c better at 6.13 January 2020  ? ?Sees Dr Carlean Purl for Esophageal dysphagia with dysmotility and has had dilation   ? 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 ?Had myalgias with pravastatin and similar with crestor Last telephone visit recommended trying Zocor M/W/F LDL is only 85 with no documented vascular dx   ? ?Past Medical History:  ?Diagnosis Date  ? Adenomatous polyps   ? Anxiety   ? Arthritis   ? Asthma   ? BPH (benign prostatic hyperplasia)   ? Cancer [facility])   ? basal cell on nose  ? Complication of anesthesia   ? slow to wake up trouble voiding after a back surgery   ? Diabetes ([place])   ? Diverticulosis   ? ED (erectile dysfunction)   ? Esophageal stricture   ? First degree AV block   ? GERD (gastroesophageal reflux disease)   ? History of kidney stones   ? HTN (hypertension)   ? ETT negative 10/08  ? Hymenoptera allergy   ? Internal hemorrhoids   ? Palpitations   ? Staph infection   ? in right elbow  ? Unspecified gastritis and gastroduodenitis without mention of hemorrhage 02/19/2013  ? ? ?Past Surgical History:  ?Procedure Laterality Date  ? bilateral inguinal hernia repair    ? COLONOSCOPY W/ BIOPSIES AND POLYPECTOMY  09/15/2008  ? adonomatous polyps, diverticulosis, internal hemorrhoids  ?  CYSTOSCOPY/RETROGRADE/URETEROSCOPY/STONE EXTRACTION WITH BASKET Left 12/08/2012  ? Procedure: CYSTOSCOPY, LEFT RETROGRADE, LEFT URETEROSCOPY/STONE EXTRACTION WITH BASKET;  Surgeon: Alexis Frock, MD;  Location: [facility];  Service: Urology;  Laterality: Left;  ? ESOPHAGOGASTRODUODENOSCOPY  02/19/2013  ? INGUINAL HERNIA REPAIR Left 07/25/2016  ? Procedure: LAPAROSCOPIC REPAIR RECURRENT LEFT INGUINAL HERNIA WITH MESH POSSIBLE OPEN;  Surgeon: Fanny Skates, MD;  Location: [facility];  Service: General;  Laterality: Left;  ? INSERTION OF MESH Left 07/25/2016  ? Procedure: INSERTION OF MESH;  Surgeon: Fanny Skates, MD;  Location: [facility];  Service: General;  Laterality: Left;  ? lower back surgery with lumbar vertebral repair    ? nasl septoplasty    ? right foot surgery    ? UPPER GASTROINTESTINAL ENDOSCOPY  02/07/2010  ? w/dil, tourtuous esophagus, hiatal hernia  ? ? ? ?Current Outpatient Medications  ?Medication Sig Dispense Refill  ? albuterol (PROAIR HFA) 108 (90 Base) MCG/ACT inhaler Inhale two puffs every 4-6 hours if needed for cough or wheeze. 1 each 1  ? amLODipine (NORVASC) 10 MG tablet Take 1 tablet (10 mg total) by mouth daily. 90 tablet 3  ? Ascorbic Acid (VITAMIN C PO) Take by mouth daily.    ? aspirin 81 MG tablet Take 81 mg  by mouth 2 (two) times daily.    ? Cholecalciferol (VITAMIN D3) 5000 units CAPS Take 10,000 Units by mouth daily.    ? clonazePAM (KLONOPIN) 0.5 MG tablet TAKE 1 TABLET BY MOUTH DAILY AS NEEDED 30 tablet 1  ? Coenzyme Q10 (COQ-10 PO) Take 100 mg by mouth. Take 2 daily    ? EPINEPHrine 0.3 mg/0.3 mL IJ SOAJ injection Use as directed for life-threatening allergic reaction. 2 each 3  ? fluticasone-salmeterol (ADVAIR DISKUS) 100-50 MCG/ACT AEPB Inhale one puff one to two times daily as directed during asthma flare-ups.  Rinse, gargle, and spit after use. 60 each 11  ? GARLIC PO Take by mouth daily.    ? hydrochlorothiazide (HYDRODIURIL) 12.5 MG tablet Take 12.5 mg by mouth daily.    ? losartan  (COZAAR) 100 MG tablet Take 1 tablet (100 mg total) by mouth daily. 90 tablet 3  ? metFORMIN (GLUCOPHAGE-XR) 500 MG 24 hr tablet SMARTSIG:1 Tablet(s) By Mouth Every Evening    ? Multiple Vitamin (MULTIVITAMIN PO) Take by mouth daily.    ? Multiple Vitamins-Minerals (ZINC PO) Take by mouth daily.    ? nystatin-triamcinolone ointment (MYCOLOG) Apply 1 application topically 2 (two) times daily. 30 g 0  ? Omega-3 Fatty Acids (FISH OIL) 1200 MG CAPS Take 1,200 mg by mouth daily.    ? RABEprazole (ACIPHEX) 20 MG tablet TAKE 1 TABLET BY MOUTH EVERY DAY 90 tablet 1  ? rosuvastatin (CRESTOR) 5 MG tablet Take 5 mg by mouth daily.    ? sertraline (ZOLOFT) 50 MG tablet Take 50 mg by mouth daily.    ? sildenafil (REVATIO) 20 MG tablet SMARTSIG:2-5 Tablet(s) By Mouth    ? Triamcinolone Acetonide (NASACORT ALLERGY 24HR NA) Place 2 sprays into the nose daily as needed (allergies).     ? TRUE METRIX BLOOD GLUCOSE TEST test strip daily.    ? TRUEplus Lancets 30G MISC CHECK BLOOD SUGAR ONCE DAILY    ? vitamin E 400 UNIT capsule Take 400 Units by mouth daily.    ? ?No current facility-administered medications for this visit.  ? ? ?Allergies:   Bee venom and Hydromorphone hcl  ? ? ?Social History:  The patient  reports that he has never smoked. He has never used smokeless tobacco. He reports that he does not drink alcohol and does not use drugs.  ? ?Family History:  The patient's family history includes Arthritis in his father; Asthma in his maternal grandfather; Emphysema in his father; Heart disease in his father; Hypertension in his mother; Liver disease in his daughter; Lung cancer in his father.  ? ? ?ROS:  General:no colds or fevers, no weight changes ?Skin:no rashes or ulcers ?HEENT:no blurred vision, no congestion ?CV:see HPI ?PUL:see HPI ?GI:no diarrhea constipation or melena, no indigestion ?GU:no hematuria, no dysuria ?MS:no joint pain, no claudication ?Neuro:no syncope, no lightheadedness ?Endo:no diabetes, no thyroid  disease ? ?Wt Readings from Last 3 Encounters:  ?09/14/21 227 lb 12.8 oz (103.3 kg)  ?06/28/21 230 lb (104.3 kg)  ?05/02/21 233 lb (105.7 kg)  ?  ? ?PHYSICAL EXAM: ?VS:  BP (!) 142/82   Pulse 68   Ht '5\' 9"'$  (1.753 m)   Wt 227 lb 12.8 oz (103.3 kg)   SpO2 96%   BMI 33.64 kg/m?  , BMI Body mass index is 33.64 kg/m?. ? ?Affect appropriate ?Healthy:  appears stated age ?HEENT: normal ?Neck supple with no adenopathy ?JVP normal no bruits no thyromegaly ?Lungs clear with no wheezing and good  diaphragmatic motion ?Heart:  S1/S2 no murmur, no rub, gallop or click ?PMI normal ?Abdomen: benighn, BS positve, no tenderness, no AAA ?no bruit.  No HSM or HJR ?Distal pulses intact with no bruits ?No edema ?Neuro non-focal ?Skin warm and dry ?No muscular weakness ? ? ? ?EKG:  03/02/19 SR rate 65 PR 218 otherwise normal 09/14/2021 SR rate 78 normal  ? ? ?Recent Labs: ?06/20/2021: ALT 18  ? ? ?Lipid Panel ?   ?Component Value Date/Time  ? CHOL 140 06/20/2021 0811  ? TRIG 79 06/20/2021 0811  ? HDL 40 06/20/2021 0811  ? CHOLHDL 3.5 06/20/2021 0811  ? CHOLHDL 5 09/28/2009 0826  ? VLDL 18.6 09/28/2009 0826  ? [place] 85 06/20/2021 0811  ? ?  ? ? ?Other studies Reviewed: ?Additional studies/ records that were reviewed today include: previous OV notes. ? ? ?ASSESSMENT AND PLAN: ? ?1.  HTN Well controlled.  Continue current medications and low sodium Dash type diet.   ? ?2.  Palpitations.  No further episodes observe  ? ?3.  1st degree AV block - monitor. Yearly ECG    ? ?4. DM:  Newly diagnosed in November 2020  on glucophage A1c improved to 6.13 January 2020  ? ?5. HLD:  at goal taking crestor 5 mg 3x/week  ? ?The following changes have been made: D/c crestor try zocor 10 mg  ?Labs/ tests ordered today include: lipid and liver in 3 months  ? ? ?Disposition:   FU: in  6 months  ? ? ?Signed, ?Jenkins Rouge, MD  ?09/14/2021 9:30 AM    ?[facility] ?[address], [place], [place] ?[facility] [place], [place] ?Phone: [phone]; Fax: [phone]  ?[phone] ?

## 2021-09-08 DIAGNOSIS — L244 Irritant contact dermatitis due to drugs in contact with skin: Secondary | ICD-10-CM | POA: Diagnosis not present

## 2021-09-08 DIAGNOSIS — L231 Allergic contact dermatitis due to adhesives: Secondary | ICD-10-CM | POA: Diagnosis not present

## 2021-09-08 DIAGNOSIS — L578 Other skin changes due to chronic exposure to nonionizing radiation: Secondary | ICD-10-CM | POA: Diagnosis not present

## 2021-09-12 ENCOUNTER — Ambulatory Visit (INDEPENDENT_AMBULATORY_CARE_PROVIDER_SITE_OTHER): Payer: PPO | Admitting: *Deleted

## 2021-09-12 DIAGNOSIS — Z91038 Other insect allergy status: Secondary | ICD-10-CM | POA: Diagnosis not present

## 2021-09-14 ENCOUNTER — Encounter: Payer: Self-pay | Admitting: Cardiovascular Disease

## 2021-09-14 ENCOUNTER — Ambulatory Visit: Payer: PPO | Admitting: Cardiovascular Disease

## 2021-09-14 VITALS — BP 142/82 | HR 68 | Ht 69.0 in | Wt 227.8 lb

## 2021-09-14 DIAGNOSIS — I10 Essential (primary) hypertension: Secondary | ICD-10-CM | POA: Diagnosis not present

## 2021-09-14 DIAGNOSIS — E782 Mixed hyperlipidemia: Secondary | ICD-10-CM

## 2021-09-14 DIAGNOSIS — I440 Atrioventricular block, first degree: Secondary | ICD-10-CM

## 2021-09-14 DIAGNOSIS — R002 Palpitations: Secondary | ICD-10-CM

## 2021-09-14 NOTE — Patient Instructions (Addendum)
Medication Instructions:  Your physician recommends that you continue on your current medications as directed. Please refer to the Current Medication list given to you today.  *If you need a refill on your cardiac medications before your next appointment, please call your pharmacy*  Lab Work: If you have labs (blood work) drawn today and your tests are completely normal, you will receive your results only by: MyChart Message (if you have MyChart) OR A paper copy in the mail If you have any lab test that is abnormal or we need to change your treatment, we will call you to review the results.  Testing/Procedures: None ordered today.  Follow-Up: At [facility], you and your health needs are our priority.  As part of our continuing mission to provide you with exceptional heart care, we have created designated Provider Care Teams.  These Care Teams include your primary Cardiologist (physician) and Advanced Practice Providers (APPs -  Physician Assistants and Nurse Practitioners) who all work together to provide you with the care you need, when you need it.  We recommend signing up for the patient portal called "MyChart".  Sign up information is provided on this After Visit Summary.  MyChart is used to connect with patients for Virtual Visits (Telemedicine).  Patients are able to view lab/test results, encounter notes, upcoming appointments, etc.  Non-urgent messages can be sent to your provider as well.   To learn more about what you can do with MyChart, go to [email or web address].    Your next appointment:   12 month(s)  The format for your next appointment:   In Person  Provider:   Peter Nishan, MD {  Other Instructions   Important Information About Sugar       

## 2021-09-19 ENCOUNTER — Ambulatory Visit: Payer: PPO

## 2021-11-07 ENCOUNTER — Ambulatory Visit (INDEPENDENT_AMBULATORY_CARE_PROVIDER_SITE_OTHER): Payer: PPO | Admitting: *Deleted

## 2021-11-07 DIAGNOSIS — Z91038 Other insect allergy status: Secondary | ICD-10-CM | POA: Diagnosis not present

## 2021-11-18 ENCOUNTER — Other Ambulatory Visit: Payer: Self-pay | Admitting: Internal Medicine

## 2021-11-22 DIAGNOSIS — I10 Essential (primary) hypertension: Secondary | ICD-10-CM | POA: Diagnosis not present

## 2021-11-22 DIAGNOSIS — E119 Type 2 diabetes mellitus without complications: Secondary | ICD-10-CM | POA: Diagnosis not present

## 2021-12-05 DIAGNOSIS — L240 Irritant contact dermatitis due to detergents: Secondary | ICD-10-CM | POA: Diagnosis not present

## 2022-01-02 ENCOUNTER — Ambulatory Visit (INDEPENDENT_AMBULATORY_CARE_PROVIDER_SITE_OTHER): Payer: PPO | Admitting: *Deleted

## 2022-01-02 DIAGNOSIS — Z91038 Other insect allergy status: Secondary | ICD-10-CM

## 2022-01-08 ENCOUNTER — Other Ambulatory Visit: Payer: Self-pay | Admitting: Cardiovascular Disease

## 2022-02-22 DIAGNOSIS — R3911 Hesitancy of micturition: Secondary | ICD-10-CM | POA: Diagnosis not present

## 2022-02-22 DIAGNOSIS — N200 Calculus of kidney: Secondary | ICD-10-CM | POA: Diagnosis not present

## 2022-02-22 DIAGNOSIS — N5201 Erectile dysfunction due to arterial insufficiency: Secondary | ICD-10-CM | POA: Diagnosis not present

## 2022-02-23 DIAGNOSIS — Z0000 Encounter for general adult medical examination without abnormal findings: Secondary | ICD-10-CM | POA: Diagnosis not present

## 2022-02-23 DIAGNOSIS — E119 Type 2 diabetes mellitus without complications: Secondary | ICD-10-CM | POA: Diagnosis not present

## 2022-02-23 DIAGNOSIS — I10 Essential (primary) hypertension: Secondary | ICD-10-CM | POA: Diagnosis not present

## 2022-02-23 DIAGNOSIS — I440 Atrioventricular block, first degree: Secondary | ICD-10-CM | POA: Diagnosis not present

## 2022-02-23 DIAGNOSIS — N400 Enlarged prostate without lower urinary tract symptoms: Secondary | ICD-10-CM | POA: Diagnosis not present

## 2022-02-23 DIAGNOSIS — E7800 Pure hypercholesterolemia, unspecified: Secondary | ICD-10-CM | POA: Diagnosis not present

## 2022-02-23 DIAGNOSIS — E669 Obesity, unspecified: Secondary | ICD-10-CM | POA: Diagnosis not present

## 2022-02-23 DIAGNOSIS — Z6831 Body mass index (BMI) 31.0-31.9, adult: Secondary | ICD-10-CM | POA: Diagnosis not present

## 2022-02-23 DIAGNOSIS — Z23 Encounter for immunization: Secondary | ICD-10-CM | POA: Diagnosis not present

## 2022-02-27 ENCOUNTER — Ambulatory Visit (INDEPENDENT_AMBULATORY_CARE_PROVIDER_SITE_OTHER): Payer: PPO | Admitting: *Deleted

## 2022-02-27 DIAGNOSIS — Z91038 Other insect allergy status: Secondary | ICD-10-CM

## 2022-03-07 DIAGNOSIS — L821 Other seborrheic keratosis: Secondary | ICD-10-CM | POA: Diagnosis not present

## 2022-03-07 DIAGNOSIS — L578 Other skin changes due to chronic exposure to nonionizing radiation: Secondary | ICD-10-CM | POA: Diagnosis not present

## 2022-03-07 DIAGNOSIS — Z09 Encounter for follow-up examination after completed treatment for conditions other than malignant neoplasm: Secondary | ICD-10-CM | POA: Diagnosis not present

## 2022-03-07 DIAGNOSIS — Z08 Encounter for follow-up examination after completed treatment for malignant neoplasm: Secondary | ICD-10-CM | POA: Diagnosis not present

## 2022-03-07 DIAGNOSIS — Z872 Personal history of diseases of the skin and subcutaneous tissue: Secondary | ICD-10-CM | POA: Diagnosis not present

## 2022-03-07 DIAGNOSIS — D1801 Hemangioma of skin and subcutaneous tissue: Secondary | ICD-10-CM | POA: Diagnosis not present

## 2022-03-07 DIAGNOSIS — R229 Localized swelling, mass and lump, unspecified: Secondary | ICD-10-CM | POA: Diagnosis not present

## 2022-03-07 DIAGNOSIS — Z85828 Personal history of other malignant neoplasm of skin: Secondary | ICD-10-CM | POA: Diagnosis not present

## 2022-03-11 ENCOUNTER — Other Ambulatory Visit: Payer: Self-pay | Admitting: Cardiovascular Disease

## 2022-03-29 DIAGNOSIS — M25561 Pain in right knee: Secondary | ICD-10-CM | POA: Diagnosis not present

## 2022-04-24 ENCOUNTER — Ambulatory Visit (INDEPENDENT_AMBULATORY_CARE_PROVIDER_SITE_OTHER): Payer: PPO | Admitting: *Deleted

## 2022-04-24 DIAGNOSIS — Z91038 Other insect allergy status: Secondary | ICD-10-CM | POA: Diagnosis not present

## 2022-05-09 ENCOUNTER — Other Ambulatory Visit: Payer: Self-pay | Admitting: Cardiovascular Disease

## 2022-05-17 ENCOUNTER — Other Ambulatory Visit: Payer: Self-pay | Admitting: Internal Medicine

## 2022-05-25 ENCOUNTER — Ambulatory Visit: Payer: Medicare HMO | Admitting: Internal Medicine

## 2022-05-30 ENCOUNTER — Other Ambulatory Visit: Payer: Self-pay

## 2022-05-30 ENCOUNTER — Ambulatory Visit: Payer: Medicare HMO | Admitting: Internal Medicine

## 2022-05-30 ENCOUNTER — Encounter: Payer: Self-pay | Admitting: Internal Medicine

## 2022-05-30 VITALS — BP 128/72 | HR 82 | Temp 97.6°F | Resp 18 | Ht 70.0 in | Wt 221.2 lb

## 2022-05-30 DIAGNOSIS — E119 Type 2 diabetes mellitus without complications: Secondary | ICD-10-CM | POA: Diagnosis not present

## 2022-05-30 MED ORDER — GLUCOSE BLOOD VI STRP: ORAL_STRIP | 12 refills | Status: DC

## 2022-05-30 MED ORDER — ONETOUCH ULTRASOFT LANCETS MISC: 12 refills | Status: DC

## 2022-05-30 NOTE — Assessment & Plan Note (Signed)
He wants to hold off on getting a HgBA1c until his next visit until he watches his diet and exercises more.  We will continue his current meds.

## 2022-05-30 NOTE — Progress Notes (Signed)
Office Visit  Subjective   Patient ID: Alex Castillo   DOB: 1945/09/26   Age: 77 y.o.   MRN: [id number]   Chief Complaint Chief Complaint  Patient presents with   Follow-up    T2D     History of Present Illness The patient is a 77 year old Caucasian/White male who returns for a follow-up visit for his T2 diabetes.  He was diagnosed with new onset diabetes on 03/2018 with a HgBA1c of 9.7%.  Over the interim, he states he has not been compliant over the holidays with his diet.  This past year, his diabetes was not controlled and we increased his metformin ER from '500mg'$  daily to '500mg'$  BID.  He remains on Metformin ER 500 mg po BID. He is not exercising as much. He specifically denies unexplained abdominal pain, nausea or vomiting, or documented hypoglycemia.  He is checking his FSBS 2-3 times per week and his sugars have been running 100-140.   He came in fasting today in anticipation of lab work.  His last HgBA1c was done 3 months ago and was 6.9%. There are no long term complications of diabetes with no diabetic retinopathy, neuropathy, nephropathy or cardiovascular disease.  He had a dilated eye exam for his yearly diabetic eye exam done on 08/30/2021 and there was no evidence of diabetic retinopathy.     Past Medical History Past Medical History:  Diagnosis Date   Adenomatous polyps    Anxiety    Arthritis    Asthma    BPH (benign prostatic hyperplasia)    Cancer (HCC)    basal cell on nose   Complication of anesthesia    slow to wake up trouble voiding after a back surgery    Diabetes ([place])    Diverticulosis    ED (erectile dysfunction)    Esophageal stricture    First degree AV block    GERD (gastroesophageal reflux disease)    Hiatal hernia    History of kidney stones    History of peptic ulcer    HTN (hypertension)    ETT negative 10/08   Hymenoptera allergy    Hypercholesterolemia    Internal hemorrhoids    Palpitations    Staph infection    in right elbow    T2DM (type 2 diabetes mellitus) ([place])    Unspecified gastritis and gastroduodenitis without mention of hemorrhage 02/19/2013     Allergies Allergies  Allergen Reactions   Bee Venom Anaphylaxis   Hydromorphone Hcl Nausea And Vomiting     Medications  Current Outpatient Medications:    albuterol (PROAIR HFA) 108 (90 Base) MCG/ACT inhaler, Inhale two puffs every 4-6 hours if needed for cough or wheeze., Disp: 1 each, Rfl: 1   amLODipine (NORVASC) 10 MG tablet, TAKE 1 TABLET BY MOUTH ONCE DAILY, Disp: 90 tablet, Rfl: 1   Ascorbic Acid (VITAMIN C PO), Take by mouth daily., Disp: , Rfl:    aspirin 81 MG tablet, Take 81 mg by mouth 2 (two) times daily., Disp: , Rfl:    Cholecalciferol (VITAMIN D3) 5000 units CAPS, Take 10,000 Units by mouth daily., Disp: , Rfl:    clonazePAM (KLONOPIN) 0.5 MG tablet, TAKE 1 TABLET BY MOUTH DAILY AS NEEDED, Disp: 30 tablet, Rfl: 1   hydrochlorothiazide (HYDRODIURIL) 12.5 MG tablet, Take 12.5 mg by mouth daily., Disp: , Rfl:    losartan (COZAAR) 100 MG tablet, TAKE 1 TABLET(100 MG) BY MOUTH DAILY, Disp: 90 tablet, Rfl: 1   metFORMIN (GLUCOPHAGE-XR)  500 MG 24 hr tablet, Take 500 mg by mouth 2 (two) times daily with a meal., Disp: , Rfl:    RABEprazole (ACIPHEX) 20 MG tablet, TAKE 1 TABLET BY MOUTH EVERY DAY, Disp: 90 tablet, Rfl: 0   rosuvastatin (CRESTOR) 5 MG tablet, Take 5 mg by mouth daily., Disp: , Rfl:    sertraline (ZOLOFT) 50 MG tablet, Take 50 mg by mouth daily., Disp: , Rfl:    Coenzyme Q10 (COQ-10 PO), Take 100 mg by mouth. Take 2 daily, Disp: , Rfl:    EPINEPHrine 0.3 mg/0.3 mL IJ SOAJ injection, Use as directed for life-threatening allergic reaction., Disp: 2 each, Rfl: 3   fluticasone-salmeterol (ADVAIR DISKUS) 100-50 MCG/ACT AEPB, Inhale one puff one to two times daily as directed during asthma flare-ups.  Rinse, gargle, and spit after use., Disp: 60 each, Rfl: 11   GARLIC PO, Take by mouth daily., Disp: , Rfl:    Multiple Vitamin (MULTIVITAMIN  PO), Take by mouth daily., Disp: , Rfl:    Multiple Vitamins-Minerals (ZINC PO), Take by mouth daily., Disp: , Rfl:    nystatin-triamcinolone ointment (MYCOLOG), Apply 1 application topically 2 (two) times daily., Disp: 30 g, Rfl: 0   Omega-3 Fatty Acids (FISH OIL) 1200 MG CAPS, Take 1,200 mg by mouth daily., Disp: , Rfl:    sildenafil (REVATIO) 20 MG tablet, SMARTSIG:2-5 Tablet(s) By Mouth, Disp: , Rfl:    Triamcinolone Acetonide (NASACORT ALLERGY 24HR NA), Place 2 sprays into the nose daily as needed (allergies). , Disp: , Rfl:    TRUE METRIX BLOOD GLUCOSE TEST test strip, daily., Disp: , Rfl:    TRUEplus Lancets 30G MISC, CHECK BLOOD SUGAR ONCE DAILY, Disp: , Rfl:    vitamin E 400 UNIT capsule, Take 400 Units by mouth daily., Disp: , Rfl:    Review of Systems Review of Systems  Constitutional:  Negative for chills and fever.  Eyes:  Negative for blurred vision and double vision.  Respiratory:  Negative for cough and shortness of breath.   Cardiovascular:  Negative for chest pain, palpitations and leg swelling.  Gastrointestinal:  Negative for abdominal pain, constipation, diarrhea, nausea and vomiting.  Neurological:  Negative for dizziness, weakness and headaches.       Objective:    Vitals BP 128/72 (BP Location: Right Arm, Patient Position: Sitting, Cuff Size: Normal)   Pulse 82   Temp 97.6 F (36.4 C) (Temporal)   Resp 18   Ht '5\' 10"'$  (1.778 m)   Wt 221 lb 3.2 oz (100.3 kg)   SpO2 95%   BMI 31.74 kg/m    Physical Examination Physical Exam Constitutional:      Appearance: Normal appearance. He is not ill-appearing.  Cardiovascular:     Rate and Rhythm: Normal rate and regular rhythm.     Pulses: Normal pulses.     Heart sounds: No murmur heard.    No friction rub. No gallop.  Pulmonary:     Effort: Pulmonary effort is normal. No respiratory distress.     Breath sounds: No wheezing, rhonchi or rales.  Abdominal:     General: Abdomen is flat. Bowel sounds are  normal. There is no distension.     Palpations: Abdomen is soft.     Tenderness: There is no abdominal tenderness.  Musculoskeletal:     Right lower leg: No edema.     Left lower leg: No edema.  Skin:    General: Skin is warm and dry.     Findings: No rash.  Neurological:     Mental Status: He is alert.        Assessment & Plan:   Diabetes mellitus without complication ([place]) He wants to hold off on getting a HgBA1c until his next visit until he watches his diet and exercises more.  We will continue his current meds.    No follow-ups on file.   Townsend Roger, MD

## 2022-05-31 ENCOUNTER — Other Ambulatory Visit: Payer: Self-pay

## 2022-05-31 MED ORDER — ONETOUCH ULTRASOFT LANCETS MISC: 12 refills | Status: DC

## 2022-06-19 ENCOUNTER — Ambulatory Visit (INDEPENDENT_AMBULATORY_CARE_PROVIDER_SITE_OTHER): Payer: Medicare HMO | Admitting: *Deleted

## 2022-06-19 DIAGNOSIS — T63481A Toxic effect of venom of other arthropod, accidental (unintentional), initial encounter: Secondary | ICD-10-CM

## 2022-06-20 DIAGNOSIS — L300 Nummular dermatitis: Secondary | ICD-10-CM | POA: Diagnosis not present

## 2022-08-13 ENCOUNTER — Ambulatory Visit (INDEPENDENT_AMBULATORY_CARE_PROVIDER_SITE_OTHER): Payer: Medicare HMO

## 2022-08-13 DIAGNOSIS — T63481A Toxic effect of venom of other arthropod, accidental (unintentional), initial encounter: Secondary | ICD-10-CM

## 2022-08-14 ENCOUNTER — Ambulatory Visit: Payer: Medicare HMO

## 2022-08-17 ENCOUNTER — Telehealth: Payer: Self-pay

## 2022-08-17 DIAGNOSIS — L089 Local infection of the skin and subcutaneous tissue, unspecified: Secondary | ICD-10-CM | POA: Diagnosis not present

## 2022-08-17 NOTE — Telephone Encounter (Signed)
Called and lm on vm to advise pt to call the office for appt on Monday. I will hold this message and reach out again first thing Monday morning for same day appt.

## 2022-08-20 ENCOUNTER — Other Ambulatory Visit: Payer: Self-pay | Admitting: Internal Medicine

## 2022-08-20 ENCOUNTER — Other Ambulatory Visit: Payer: Self-pay | Admitting: Cardiovascular Disease

## 2022-08-20 ENCOUNTER — Ambulatory Visit: Payer: Medicare HMO | Admitting: Orthopedic Surgery

## 2022-08-20 DIAGNOSIS — L97511 Non-pressure chronic ulcer of other part of right foot limited to breakdown of skin: Secondary | ICD-10-CM

## 2022-08-20 NOTE — Telephone Encounter (Signed)
Pt will come in at 10:45 today.

## 2022-08-21 ENCOUNTER — Other Ambulatory Visit: Payer: Self-pay | Admitting: Internal Medicine

## 2022-08-24 ENCOUNTER — Ambulatory Visit: Payer: PPO | Admitting: Family

## 2022-08-28 ENCOUNTER — Encounter: Payer: Self-pay | Admitting: Internal Medicine

## 2022-08-28 ENCOUNTER — Ambulatory Visit: Payer: Medicare HMO | Admitting: Internal Medicine

## 2022-08-28 VITALS — BP 130/70 | HR 79 | Temp 98.2°F | Resp 16 | Ht 70.0 in | Wt 217.6 lb

## 2022-08-28 DIAGNOSIS — E119 Type 2 diabetes mellitus without complications: Secondary | ICD-10-CM | POA: Diagnosis not present

## 2022-08-28 DIAGNOSIS — I10 Essential (primary) hypertension: Secondary | ICD-10-CM | POA: Diagnosis not present

## 2022-08-28 DIAGNOSIS — E7800 Pure hypercholesterolemia, unspecified: Secondary | ICD-10-CM

## 2022-08-28 MED ORDER — FLUTICASONE-SALMETEROL 100-50 MCG/ACT IN AEPB: INHALATION_SPRAY | RESPIRATORY_TRACT | 11 refills | Status: DC

## 2022-08-28 NOTE — Progress Notes (Signed)
Office Visit  Subjective   Patient ID: Alex Castillo   DOB: 1946/03/30   Age: 77 y.o.   MRN: [id number]   Chief Complaint Chief Complaint  Patient presents with   Follow-up    Diabetes     History of Present Illness The patient is a 77 year old Caucasian/White male who returns for a follow-up visit for his T2 diabetes.  He was diagnosed with new onset diabetes on 03/2018 with a HgBA1c of 9.7%.  Over the interim, he states he has noticed some elevated in his blood sugars.  He is trying to get back to exercise with tennis and he is watching his diet.  Last year, his diabetes was not controlled and we increased his metformin ER from 500mg  daily to 500mg  BID.  He remains on Metformin ER 500 mg po BID. He is not exercising as much. He specifically denies unexplained abdominal pain, nausea or vomiting, or documented hypoglycemia.  He is checking his FSBS 2-3 times per week and his sugars have been running 100-150.   He came in fasting today in anticipation of lab work.  His last HgBA1c was done 6 months ago and was 6.9%. There are no long term complications of diabetes with no diabetic retinopathy, neuropathy, nephropathy or cardiovascular disease.  He had a dilated eye exam for his yearly diabetic eye exam done on 08/30/2021 and there was no evidence of diabetic retinopathy.  The patient is a 77 year old Caucasian/White male who presents for a follow-up evaluation of hypertension. Last year, his BP was not controlled and we restarted HCTZ 12.5mg  daily.   The patient has been checking his blood pressure at home and his systolic BP's have been running in the 130's. The patient's current medications include: Cozaar 100 mg daily, HCTZ 12.5mg  daily and amlodipine 10mg  daily.  The patient has been tolerating his medications well. The patient denies any headache, visual changes, dizziness, lightheadness, chest pain, shortness of breath, weakness/numbness, and edema.  Alex Castillo is a 77 yo male who  returns today for routine followup on his cholesterol.   The patient was seen by his cardiologist last year where they were going to change him from crestor to zocor due to him having some myalgias.  Today, he states his myalgias are now resolved but he admits he is not really taking his crestor.    This past year he was contacted by [facility] where they noted he was not filling his crestor.  He told them that he was having myalgias from his crestor and he stopped the medication.  He restarted his crestor where he is supposed to be taking it 3 times per week.  He again denies any myalgias with this dosing.   Overall, he states he is doing well and is without any complaints or problems at this time. He specifically denies nausea, vomiting, diarrhea, myalgias, and fatigue. He remains on dietary management as well as the following cholesterol lowering medications crestor 5mg  which he takes 3 times per week (he has taken this in 6 months) as well as fish oil and coenzyme Q10. He is fasting in anticipation for labs today.       Past Medical History Past Medical History:  Diagnosis Date   Adenomatous polyps    Anxiety    Arthritis    Asthma    BPH (benign prostatic hyperplasia)    Cancer    basal cell on nose   Complication of anesthesia  slow to wake up trouble voiding after a back surgery    Diabetes    Diverticulosis    ED (erectile dysfunction)    Esophageal stricture    First degree AV block    GERD (gastroesophageal reflux disease)    Hiatal hernia    History of kidney stones    History of peptic ulcer    HTN (hypertension)    ETT negative 10/08   Hymenoptera allergy    Hypercholesterolemia    Internal hemorrhoids    Palpitations    Staph infection    in right elbow   T2DM (type 2 diabetes mellitus)    Unspecified gastritis and gastroduodenitis without mention of hemorrhage 02/19/2013     Allergies Allergies  Allergen Reactions   Bee Venom Anaphylaxis   Hydromorphone  Hcl Nausea And Vomiting     Medications  Current Outpatient Medications:    glucose blood test strip, Use as instructed, Disp: 100 each, Rfl: 12   albuterol (PROAIR HFA) 108 (90 Base) MCG/ACT inhaler, Inhale two puffs every 4-6 hours if needed for cough or wheeze., Disp: 1 each, Rfl: 1   amLODipine (NORVASC) 10 MG tablet, TAKE 1 TABLET BY MOUTH ONCE DAILY, Disp: 90 tablet, Rfl: 1   Ascorbic Acid (VITAMIN C PO), Take by mouth daily., Disp: , Rfl:    aspirin 81 MG tablet, Take 81 mg by mouth 2 (two) times daily., Disp: , Rfl:    Cholecalciferol (VITAMIN D3) 5000 units CAPS, Take 10,000 Units by mouth daily., Disp: , Rfl:    clonazePAM (KLONOPIN) 0.5 MG tablet, TAKE 1 TABLET BY MOUTH DAILY AS NEEDED, Disp: 30 tablet, Rfl: 1   Coenzyme Q10 (COQ-10 PO), Take 100 mg by mouth. Take 2 daily, Disp: , Rfl:    EPINEPHrine 0.3 mg/0.3 mL IJ SOAJ injection, Use as directed for life-threatening allergic reaction., Disp: 2 each, Rfl: 3   fluticasone-salmeterol (ADVAIR DISKUS) 100-50 MCG/ACT AEPB, Inhale one puff one to two times daily as directed during asthma flare-ups.  Rinse, gargle, and spit after use., Disp: 60 each, Rfl: 11   GARLIC PO, Take by mouth daily., Disp: , Rfl:    hydrochlorothiazide (HYDRODIURIL) 12.5 MG tablet, TAKE 1 TABLET(12.5 MG) BY MOUTH EVERY DAY, Disp: 90 tablet, Rfl: 0   Lancets (ONETOUCH ULTRASOFT) lancets, Check FSBS twice daily, Disp: 100 each, Rfl: 12   losartan (COZAAR) 100 MG tablet, TAKE 1 TABLET(100 MG) BY MOUTH DAILY, Disp: 30 tablet, Rfl: 0   metFORMIN (GLUCOPHAGE-XR) 500 MG 24 hr tablet, Take 500 mg by mouth 2 (two) times daily with a meal., Disp: , Rfl:    Multiple Vitamin (MULTIVITAMIN PO), Take by mouth daily., Disp: , Rfl:    Multiple Vitamins-Minerals (ZINC PO), Take by mouth daily., Disp: , Rfl:    nystatin-triamcinolone ointment (MYCOLOG), Apply 1 application topically 2 (two) times daily., Disp: 30 g, Rfl: 0   Omega-3 Fatty Acids (FISH OIL) 1200 MG CAPS, Take  1,200 mg by mouth daily., Disp: , Rfl:    RABEprazole (ACIPHEX) 20 MG tablet, TAKE 1 TABLET BY MOUTH EVERY DAY, Disp: 90 tablet, Rfl: 0   rosuvastatin (CRESTOR) 5 MG tablet, TAKE 1 TABLET(5 MG) BY MOUTH 3 TIMES EVERY WEEK, Disp: 90 tablet, Rfl: 0   sertraline (ZOLOFT) 50 MG tablet, Take 50 mg by mouth daily., Disp: , Rfl:    sildenafil (REVATIO) 20 MG tablet, SMARTSIG:2-5 Tablet(s) By Mouth, Disp: , Rfl:    Triamcinolone Acetonide (NASACORT ALLERGY 24HR NA), Place 2 sprays into the  nose daily as needed (allergies). , Disp: , Rfl:    TRUE METRIX BLOOD GLUCOSE TEST test strip, daily., Disp: , Rfl:    vitamin E 400 UNIT capsule, Take 400 Units by mouth daily., Disp: , Rfl:    Review of Systems Review of Systems  Constitutional:  Negative for chills and fever.  Eyes:  Negative for blurred vision and double vision.  Respiratory:  Negative for cough, shortness of breath and wheezing.   Cardiovascular:  Negative for chest pain, palpitations and leg swelling.  Gastrointestinal:  Negative for abdominal pain, constipation, diarrhea, nausea and vomiting.  Genitourinary:  Negative for frequency.  Musculoskeletal:  Negative for myalgias.  Skin:  Negative for itching and rash.  Neurological:  Negative for dizziness, weakness and headaches.       Objective:    Vitals BP 130/70   Pulse 79   Temp 98.2 F (36.8 C)   Resp 16   Ht  (1.778 m)   Wt 217 lb 9.6 oz (98.7 kg)   SpO2 96%   BMI 31.22 kg/m    Physical Examination Physical Exam Constitutional:      Appearance: Normal appearance. He is not ill-appearing.  Cardiovascular:     Rate and Rhythm: Normal rate and regular rhythm.     Pulses: Normal pulses.     Heart sounds: No murmur heard.    No friction rub. No gallop.  Pulmonary:     Effort: Pulmonary effort is normal. No respiratory distress.     Breath sounds: No wheezing, rhonchi or rales.  Abdominal:     General: Abdomen is flat. Bowel sounds are normal. There is no  distension.     Palpations: Abdomen is soft.     Tenderness: There is no abdominal tenderness.  Musculoskeletal:     Right lower leg: No edema.     Left lower leg: No edema.  Skin:    General: Skin is warm and dry.     Findings: No rash.  Neurological:     General: No focal deficit present.     Mental Status: He is alert and oriented to person, place, and time.  Psychiatric:        Mood and Affect: Mood normal.        Behavior: Behavior normal.        Assessment & Plan:   Essential hypertension The patient's BP is doing well and we will continue on his current medications.  Diabetes mellitus without complication (HCC) We will check a HgBA1c on him today.   Our goal will be a HgbA1c <7%.  We will refer him to optometry.  Hypercholesterolemia We will also check his FLP today.  Continue with exercise, diet and lost of weight.    No follow-ups on file.   Crist Fat, MD

## 2022-08-28 NOTE — Assessment & Plan Note (Signed)
We will also check his FLP today.  Continue with exercise, diet and lost of weight.

## 2022-08-28 NOTE — Assessment & Plan Note (Signed)
The patient's BP is doing well and we will continue on his current medications.

## 2022-08-28 NOTE — Assessment & Plan Note (Signed)
We will check a HgBA1c on him today.   Our goal will be a HgbA1c <7%.  We will refer him to optometry.

## 2022-08-29 LAB — LIPID PANEL
Chol/HDL Ratio: 4.3 ratio (ref 0.0–5.0)
Cholesterol, Total: 163 mg/dL (ref 100–199)
HDL: 38 mg/dL — ABNORMAL LOW (ref >39)
LDL Chol Calc (NIH): 111 mg/dL — ABNORMAL HIGH (ref 0–99)
Triglycerides: 75 mg/dL (ref 0–149)
VLDL Cholesterol Cal: 14 mg/dL (ref 5–40)

## 2022-08-29 LAB — HEMOGLOBIN A1C
Est. average glucose Bld gHb Est-mCnc: 151 mg/dL
Hgb A1c MFr Bld: 6.9 % — ABNORMAL HIGH (ref 4.8–5.6)

## 2022-08-30 NOTE — Progress Notes (Signed)
Patient called.  Patient aware.  VE: His diabetes is doing ok- continue to watch diet and exercise.  His cholesterol is not controlled.  Ask him to restart his crestor.

## 2022-08-31 ENCOUNTER — Encounter: Payer: Self-pay | Admitting: Orthopedic Surgery

## 2022-08-31 NOTE — Progress Notes (Signed)
Office Visit Note   Patient: Alex Castillo           Date of Birth: 10-Sep-1945           MRN: [id number] Visit Date: 08/20/2022              Requested by: Crist Fat, MD [address] [place],  [place] [address] PCP: Crist Fat, MD  Chief Complaint  Patient presents with   Right Foot - Pain      HPI: Patient is a 78 year old gentleman who is seen for initial evaluation and referral from Dr. Dion Saucier.  Patient complains of ulcers between the toes on the right foot.  Patient has tried triple antibiotic ointment and Bactrim DS.  Patient's most recent hemoglobin A1c is 6.9.  Patient states he is very active with tennis.  Assessment & Plan: Visit Diagnoses:  1. Non-pressure chronic ulcer of other part of right foot limited to breakdown of skin     Plan: Recommended using wool socks to help wick away moisture.  A sample of the Vive sock was provided so he could place this between the toes to wick away the moisture.  Recommended a extra-large knee-high compression sock.  Follow-Up Instructions: Return if symptoms worsen or fail to improve.   Ortho Exam  Patient is alert, oriented, no adenopathy, well-dressed, normal affect, normal respiratory effort. Examination patient has a palpable dorsalis pedis and posterior tibial pulse there is an ulcer in the second webspace between the second and third toes this is 3 mm in diameter with healthy granulation tissue there is no tunneling no ascending cellulitis no sausage digit swelling.  The calf measures 40 cm in circumference with pitting edema.  Imaging: No results found. No images are attached to the encounter.  Labs: Lab Results  Component Value Date   HGBA1C 6.9 (H) 08/28/2022   ESRSEDRATE 5 04/27/2016   CRP 1.5 04/27/2016     Lab Results  Component Value Date   ALBUMIN 4.3 06/20/2021   ALBUMIN 4.8 04/09/2017   ALBUMIN 4.4 07/23/2016    No results found for: "MG" No results found for: "VD25OH"  No results  found for: "PREALBUMIN"    Latest Ref Rng & Units 04/09/2017   11:01 AM 07/23/2016    9:26 AM 04/27/2016   11:29 AM  CBC EXTENDED  WBC 3.4 - 10.8 x10E3/uL 12.5  7.0  7.2   RBC 4.14 - 5.80 x10E6/uL 4.95  4.88  4.78   Hemoglobin 13.0 - 17.7 g/dL 19.1  47.8  29.5   HCT 37.5 - 51.0 % 44.6  42.6  42.7   Platelets 150 - 379 x10E3/uL 277  186  205   NEUT# 1.7 - 7.7 K/uL  4.0  4.2   Lymph# 0.7 - 4.0 K/uL  2.1  2.2      There is no height or weight on file to calculate BMI.  Orders:  No orders of the defined types were placed in this encounter.  No orders of the defined types were placed in this encounter.    Procedures: No procedures performed  Clinical Data: No additional findings.  ROS:  All other systems negative, except as noted in the HPI. Review of Systems  Objective: Vital Signs: There were no vitals taken for this visit.  Specialty Comments:  No specialty comments available.  PMFS History: Patient Active Problem List   Diagnosis Date Noted   Essential hypertension 08/28/2022   Hypercholesterolemia 08/28/2022  Diabetes mellitus without complication 05/30/2022   Asthma 01/13/2015   Allergic rhinitis 01/13/2015   Hymenoptera allergy 01/13/2015   Internal hemorrhoids with Grade 2 prolapse and pruritis ani 02/04/2013   AV BLOCK, 1ST DEGREE 09/21/2009   COLONIC POLYPS, ADENOMATOUS, HX OF 11/01/2008   ARTHRITIS 08/05/2008   PALPITATIONS 08/05/2008   RECURRENT DYSPHAGIA ? ESOPHAGEAL DYSMOTILITY 07/21/2008   ANXIETY 06/27/2007   ERECTILE DYSFUNCTION 06/27/2007   GERD 06/27/2007   IBS 06/27/2007   OSTEOARTHRITIS, CERVICAL SPINE 06/27/2007   BENIGN PROSTATIC HYPERTROPHY, HX OF 06/27/2007   Past Medical History:  Diagnosis Date   Adenomatous polyps    Anxiety    Arthritis    Asthma    BPH (benign prostatic hyperplasia)    Cancer    basal cell on nose   Complication of anesthesia    slow to wake up trouble voiding after a back surgery    Diabetes     Diverticulosis    ED (erectile dysfunction)    Esophageal stricture    First degree AV block    GERD (gastroesophageal reflux disease)    Hiatal hernia    History of kidney stones    History of peptic ulcer    HTN (hypertension)    ETT negative 10/08   Hymenoptera allergy    Hypercholesterolemia    Internal hemorrhoids    Palpitations    Staph infection    in right elbow   T2DM (type 2 diabetes mellitus)    Unspecified gastritis and gastroduodenitis without mention of hemorrhage 02/19/2013    Family History  Problem Relation Age of Onset   Lung cancer Father    Heart disease Father    Arthritis Father    Emphysema Father    Hypertension Mother    Asthma Maternal Grandfather    Liver disease Daughter        Primary Sclerosisng cholangitis   Colon cancer Neg Hx    Throat cancer Neg Hx    Kidney disease Neg Hx    Stomach cancer Neg Hx    Rectal cancer Neg Hx    Esophageal cancer Neg Hx     Past Surgical History:  Procedure Laterality Date   bilateral inguinal hernia repair     COLONOSCOPY W/ BIOPSIES AND POLYPECTOMY  09/15/2008   adonomatous polyps, diverticulosis, internal hemorrhoids   CYSTOSCOPY/RETROGRADE/URETEROSCOPY/STONE EXTRACTION WITH BASKET Left 12/08/2012   Procedure: CYSTOSCOPY, LEFT RETROGRADE, LEFT URETEROSCOPY/STONE EXTRACTION WITH BASKET;  Surgeon: Sebastian Ache, MD;  Location: [facility];  Service: Urology;  Laterality: Left;   ESOPHAGOGASTRODUODENOSCOPY  02/19/2013   INGUINAL HERNIA REPAIR Left 07/25/2016   Procedure: LAPAROSCOPIC REPAIR RECURRENT LEFT INGUINAL HERNIA WITH MESH POSSIBLE OPEN;  Surgeon: Claud Kelp, MD;  Location: [facility];  Service: General;  Laterality: Left;   INSERTION OF MESH Left 07/25/2016   Procedure: INSERTION OF MESH;  Surgeon: Claud Kelp, MD;  Location: [facility];  Service: General;  Laterality: Left;   lower back surgery with lumbar vertebral repair     nasl septoplasty     right foot surgery     UPPER GASTROINTESTINAL ENDOSCOPY   02/07/2010   w/dil, tourtuous esophagus, hiatal hernia   Social History   Occupational History   Occupation: Retired  Tobacco Use   Smoking status: Never   Smokeless tobacco: Never  Vaping Use   Vaping Use: Never used  Substance and Sexual Activity   Alcohol use: No   Drug use: No   Sexual activity: Yes

## 2022-09-03 DIAGNOSIS — H25813 Combined forms of age-related cataract, bilateral: Secondary | ICD-10-CM | POA: Diagnosis not present

## 2022-09-03 DIAGNOSIS — E119 Type 2 diabetes mellitus without complications: Secondary | ICD-10-CM | POA: Diagnosis not present

## 2022-09-03 DIAGNOSIS — H40019 Open angle with borderline findings, low risk, unspecified eye: Secondary | ICD-10-CM | POA: Diagnosis not present

## 2022-09-04 ENCOUNTER — Other Ambulatory Visit: Payer: Self-pay | Admitting: Internal Medicine

## 2022-09-14 ENCOUNTER — Other Ambulatory Visit: Payer: Self-pay | Admitting: Cardiovascular Disease

## 2022-09-18 DIAGNOSIS — Z08 Encounter for follow-up examination after completed treatment for malignant neoplasm: Secondary | ICD-10-CM | POA: Diagnosis not present

## 2022-09-18 DIAGNOSIS — Z85828 Personal history of other malignant neoplasm of skin: Secondary | ICD-10-CM | POA: Diagnosis not present

## 2022-09-18 DIAGNOSIS — L570 Actinic keratosis: Secondary | ICD-10-CM | POA: Diagnosis not present

## 2022-09-18 DIAGNOSIS — D2371 Other benign neoplasm of skin of right lower limb, including hip: Secondary | ICD-10-CM | POA: Diagnosis not present

## 2022-09-18 DIAGNOSIS — R229 Localized swelling, mass and lump, unspecified: Secondary | ICD-10-CM | POA: Diagnosis not present

## 2022-09-18 DIAGNOSIS — L821 Other seborrheic keratosis: Secondary | ICD-10-CM | POA: Diagnosis not present

## 2022-09-18 DIAGNOSIS — Z872 Personal history of diseases of the skin and subcutaneous tissue: Secondary | ICD-10-CM | POA: Diagnosis not present

## 2022-09-18 DIAGNOSIS — L578 Other skin changes due to chronic exposure to nonionizing radiation: Secondary | ICD-10-CM | POA: Diagnosis not present

## 2022-09-18 DIAGNOSIS — D1801 Hemangioma of skin and subcutaneous tissue: Secondary | ICD-10-CM | POA: Diagnosis not present

## 2022-09-18 DIAGNOSIS — Z09 Encounter for follow-up examination after completed treatment for conditions other than malignant neoplasm: Secondary | ICD-10-CM | POA: Diagnosis not present

## 2022-10-09 ENCOUNTER — Ambulatory Visit (INDEPENDENT_AMBULATORY_CARE_PROVIDER_SITE_OTHER): Payer: Medicare HMO | Admitting: *Deleted

## 2022-10-09 DIAGNOSIS — T63481A Toxic effect of venom of other arthropod, accidental (unintentional), initial encounter: Secondary | ICD-10-CM

## 2022-10-09 DIAGNOSIS — T63481D Toxic effect of venom of other arthropod, accidental (unintentional), subsequent encounter: Secondary | ICD-10-CM

## 2022-10-10 ENCOUNTER — Other Ambulatory Visit: Payer: Self-pay | Admitting: Cardiovascular Disease

## 2022-10-15 DIAGNOSIS — H40019 Open angle with borderline findings, low risk, unspecified eye: Secondary | ICD-10-CM | POA: Diagnosis not present

## 2022-11-01 NOTE — Progress Notes (Signed)
Date:  11/12/2022   ID:  Alex Castillo, DOB March 12, 1946, MRN [id number]  PCP:  Crist Fat, MD  Cardiologist:  Dr. Eden Emms    History of Present Illness: Alex Castillo is a 77 y.o. male who presents for f/u of HTN, and palpitations.    Activity limited by chronic back issues but still tries to play tennis   Hx of 1st degree AV block.   Years ago he had 3 episodes of rapid HR and none since.  He is allergic to bees and had episode of arrest with initial sting.  Now carries epi pen.  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 Weight up eating a bit too much popcorn and his wife's [employer] Rx  Had myalgias with pravastatin and similar with crestor Last telephone visit recommended trying Zocor M/W/F LDL is 111 08/28/22 Primary has spoiken with him and taking crestor 3x/week Discussed risk stratification for CAD with calcium score  Discussed Nexlizet and PSK9's will get calcium score to see if necessary  Past Medical History:  Diagnosis Date   Adenomatous polyps    Anxiety    Arthritis    Asthma    BPH (benign prostatic hyperplasia)    Cancer (HCC)    basal cell on nose   Complication of anesthesia    slow to wake up trouble voiding after a back surgery    Diabetes (HCC)    Diverticulosis    ED (erectile dysfunction)    Esophageal stricture    First degree AV block    GERD (gastroesophageal reflux disease)    Hiatal hernia    History of kidney stones    History of peptic ulcer    HTN (hypertension)    ETT negative 10/08   Hymenoptera allergy    Hypercholesterolemia    Internal hemorrhoids    Palpitations    Staph infection    in right elbow   T2DM (type 2 diabetes mellitus) (HCC)    Unspecified gastritis and gastroduodenitis without mention  of hemorrhage 02/19/2013    Past Surgical History:  Procedure Laterality Date   bilateral inguinal hernia repair     COLONOSCOPY W/ BIOPSIES AND POLYPECTOMY  09/15/2008   adonomatous polyps, diverticulosis, internal hemorrhoids   CYSTOSCOPY/RETROGRADE/URETEROSCOPY/STONE EXTRACTION WITH BASKET Left 12/08/2012   Procedure: CYSTOSCOPY, LEFT RETROGRADE, LEFT URETEROSCOPY/STONE EXTRACTION WITH BASKET;  Surgeon: Sebastian Ache, MD;  Location: [facility];  Service: Urology;  Laterality: Left;   ESOPHAGOGASTRODUODENOSCOPY  02/19/2013   INGUINAL HERNIA REPAIR Left 07/25/2016   Procedure: LAPAROSCOPIC REPAIR RECURRENT LEFT INGUINAL HERNIA WITH MESH POSSIBLE OPEN;  Surgeon: Claud Kelp, MD;  Location: [facility];  Service: General;  Laterality: Left;   INSERTION OF MESH Left 07/25/2016   Procedure: INSERTION OF MESH;  Surgeon: Claud Kelp, MD;  Location: [facility];  Service: General;  Laterality: Left;   lower back surgery with lumbar vertebral repair     nasl septoplasty     right foot surgery     UPPER GASTROINTESTINAL ENDOSCOPY  02/07/2010   w/dil, tourtuous esophagus, hiatal hernia     Current Outpatient Medications  Medication Sig Dispense Refill   glucose blood test strip Use as instructed 100 each 12   albuterol (PROAIR HFA) 108 (  90 Base) MCG/ACT inhaler Inhale two puffs every 4-6 hours if needed for cough or wheeze. 1 each 1   amLODipine (NORVASC) 10 MG tablet TAKE 1 TABLET BY MOUTH ONCE DAILY 90 tablet 1   Ascorbic Acid (VITAMIN C PO) Take by mouth daily.     aspirin 81 MG tablet Take 81 mg by mouth 2 (two) times daily.     Cholecalciferol (VITAMIN D3) 5000 units CAPS Take 10,000 Units by mouth daily.     clonazePAM (KLONOPIN) 0.5 MG tablet TAKE 1 TABLET BY MOUTH DAILY AS NEEDED 30 tablet 1   Coenzyme Q10 (COQ-10 PO) Take 100 mg by mouth. Take 2 daily     EPINEPHrine 0.3 mg/0.3 mL IJ SOAJ injection Use as directed for life-threatening allergic reaction. 2 each 3   fluticasone-salmeterol (ADVAIR  DISKUS) 100-50 MCG/ACT AEPB Inhale one puff one to two times daily as directed during asthma flare-ups.  Rinse, gargle, and spit after use. 60 each 11   GARLIC PO Take by mouth daily.     hydrochlorothiazide (HYDRODIURIL) 12.5 MG tablet TAKE 1 TABLET(12.5 MG) BY MOUTH EVERY DAY 90 tablet 0   Lancets (ONETOUCH ULTRASOFT) lancets Check FSBS twice daily 100 each 12   losartan (COZAAR) 100 MG tablet TAKE 1 TABLET(100 MG) BY MOUTH DAILY 30 tablet 0   metFORMIN (GLUCOPHAGE-XR) 500 MG 24 hr tablet Take 500 mg by mouth 2 (two) times daily with a meal.     Multiple Vitamin (MULTIVITAMIN PO) Take by mouth daily.     Multiple Vitamins-Minerals (ZINC PO) Take by mouth daily.     nystatin-triamcinolone ointment (MYCOLOG) Apply 1 application topically 2 (two) times daily. (Patient not taking: Reported on 11/12/2022) 30 g 0   Omega-3 Fatty Acids (FISH OIL) 1200 MG CAPS Take 1,200 mg by mouth daily.     RABEprazole (ACIPHEX) 20 MG tablet TAKE 1 TABLET BY MOUTH EVERY DAY 90 tablet 0   rosuvastatin (CRESTOR) 5 MG tablet TAKE 1 TABLET(5 MG) BY MOUTH 3 TIMES EVERY WEEK 90 tablet 0   sertraline (ZOLOFT) 50 MG tablet Take 50 mg by mouth daily.     sildenafil (REVATIO) 20 MG tablet SMARTSIG:2-5 Tablet(s) By Mouth     Triamcinolone Acetonide (NASACORT ALLERGY 24HR NA) Place 2 sprays into the nose daily as needed (allergies).  (Patient not taking: Reported on 11/12/2022)     TRUE METRIX BLOOD GLUCOSE TEST test strip daily.     vitamin E 400 UNIT capsule Take 400 Units by mouth daily.     No current facility-administered medications for this visit.    Allergies:   Bee venom and Hydromorphone hcl    Social History:  The patient  reports that he has never smoked. He has never used smokeless tobacco. He reports that he does not drink alcohol and does not use drugs.   Family History:  The patient's family history includes Arthritis in his father; Asthma in his maternal grandfather; Emphysema in his father; Heart disease in  his father; Hypertension in his mother; Liver disease in his daughter; Lung cancer in his father.    ROS:  General:no colds or fevers, no weight changes Skin:no rashes or ulcers HEENT:no blurred vision, no congestion CV:see HPI PUL:see HPI GI:no diarrhea constipation or melena, no indigestion GU:no hematuria, no dysuria MS:no joint pain, no claudication Neuro:no syncope, no lightheadedness Endo:no diabetes, no thyroid disease  Wt Readings from Last 3 Encounters:  11/12/22 221 lb 9.6 oz (100.5 kg)  08/28/22 217 lb 9.6 oz (98.7  kg)  05/30/22 221 lb 3.2 oz (100.3 kg)     PHYSICAL EXAM: VS:  BP 124/76   Pulse 67   Ht 5\' 10"  (1.778 m)   Wt 221 lb 9.6 oz (100.5 kg)   SpO2 97%   BMI 31.80 kg/m  , BMI Body mass index is 31.8 kg/m.  Affect appropriate Healthy:  appears stated age HEENT: normal Neck supple with no adenopathy JVP normal no bruits no thyromegaly Lungs clear with no wheezing and good diaphragmatic motion Heart:  S1/S2 no murmur, no rub, gallop or click PMI normal Abdomen: benighn, BS positve, no tenderness, no AAA no bruit.  No HSM or HJR Distal pulses intact with no bruits No edema Neuro non-focal Skin warm and dry No muscular weakness    EKG:  11/12/2022 SR normal PR 244 msec   Recent Labs: No results found for requested labs within last 365 days.    Lipid Panel    Component Value Date/Time   CHOL 163 08/28/2022 0937   TRIG 75 08/28/2022 0937   HDL 38 (L) 08/28/2022 0937   CHOLHDL 4.3 08/28/2022 0937   CHOLHDL 5 09/28/2009 0826   VLDL 18.6 09/28/2009 0826   LDLCALC 111 (H) 08/28/2022 0937      Other studies Reviewed: Additional studies/ records that were reviewed today include: previous OV notes.   ASSESSMENT AND PLAN:  1.  HTN Well controlled.  Continue current medications and low sodium Dash type diet.    2.  Palpitations.  No further episodes observe   3.  1st degree AV block - monitor. Yearly ECG   PR 244 msec on today's ECG  4.  DM:  Newly diagnosed in November 2020  on glucophage A1c improved to 6.9  5. HLD: not at goal taking crestor 5 mg 3x/week Calcium score to risk stratify for CAD given side effects from meds and LDL > 100 in setting of DM Consider Nexlizet/PSK9 if calcium score above average for age  Coronary Calcium Score   Disposition:   FU: in  a year    Signed, Charlton Haws, MD  11/12/2022 8:33 AM    [facility] [address] [place], [place], [place]  [address]/ [address] [place], [place] Phone: [phone]; Fax: [phone]  [phone]

## 2022-11-09 ENCOUNTER — Other Ambulatory Visit: Payer: Self-pay | Admitting: Internal Medicine

## 2022-11-12 ENCOUNTER — Encounter: Payer: Self-pay | Admitting: Cardiovascular Disease

## 2022-11-12 ENCOUNTER — Ambulatory Visit: Payer: Medicare HMO | Attending: Cardiovascular Disease | Admitting: Cardiovascular Disease

## 2022-11-12 VITALS — BP 124/76 | HR 67 | Ht 70.0 in | Wt 221.6 lb

## 2022-11-12 DIAGNOSIS — I10 Essential (primary) hypertension: Secondary | ICD-10-CM | POA: Diagnosis not present

## 2022-11-12 DIAGNOSIS — I440 Atrioventricular block, first degree: Secondary | ICD-10-CM

## 2022-11-12 NOTE — Patient Instructions (Signed)
Medication Instructions:  Your physician recommends that you continue on your current medications as directed. Please refer to the Current Medication list given to you today.  *If you need a refill on your cardiac medications before your next appointment, please call your pharmacy*   Lab Work: If you have labs (blood work) drawn today and your tests are completely normal, you will receive your results only by: MyChart Message (if you have MyChart) OR A paper copy in the mail If you have any lab test that is abnormal or we need to change your treatment, we will call you to review the results.  Testing/Procedures: Cardiac CT calcium score scanning, (CAT scanning), is a noninvasive, special x-ray that produces cross-sectional images of the body using x-rays and a computer. CT scans help physicians diagnose and treat medical conditions. For some CT exams, a contrast material is used to enhance visibility in the area of the body being studied. CT scans provide greater clarity and reveal more details than regular x-ray exams.  Follow-Up: At [facility], you and your health needs are our priority.  As part of our continuing mission to provide you with exceptional heart care, we have created designated Provider Care Teams.  These Care Teams include your primary Cardiologist (physician) and Advanced Practice Providers (APPs -  Physician Assistants and Nurse Practitioners) who all work together to provide you with the care you need, when you need it.  We recommend signing up for the patient portal called "MyChart".  Sign up information is provided on this After Visit Summary.  MyChart is used to connect with patients for Virtual Visits (Telemedicine).  Patients are able to view lab/test results, encounter notes, upcoming appointments, etc.  Non-urgent messages can be sent to your provider as well.   To learn more about what you can do with MyChart, go to [email or web address].    Your next  appointment:   1 year(s)  Provider:   Charlton Haws, MD

## 2022-11-23 ENCOUNTER — Other Ambulatory Visit: Payer: Self-pay | Admitting: Internal Medicine

## 2022-11-27 DIAGNOSIS — M25572 Pain in left ankle and joints of left foot: Secondary | ICD-10-CM | POA: Diagnosis not present

## 2022-11-29 ENCOUNTER — Other Ambulatory Visit: Payer: Self-pay | Admitting: Internal Medicine

## 2022-11-29 MED ORDER — CLONAZEPAM 0.5 MG PO TABS: 0.5000 mg | ORAL_TABLET | Freq: Every day | ORAL | 1 refills | Status: DC | PRN

## 2022-12-03 ENCOUNTER — Ambulatory Visit (HOSPITAL_COMMUNITY)
Admission: RE | Admit: 2022-12-03 | Discharge: 2022-12-03 | Disposition: A | Discharge status: Home / Self Care | Payer: Medicare HMO | Source: Ambulatory Visit | Attending: Cardiovascular Disease | Admitting: Cardiovascular Disease

## 2022-12-03 DIAGNOSIS — I10 Essential (primary) hypertension: Secondary | ICD-10-CM | POA: Insufficient documentation

## 2022-12-03 DIAGNOSIS — I440 Atrioventricular block, first degree: Secondary | ICD-10-CM | POA: Insufficient documentation

## 2022-12-04 ENCOUNTER — Ambulatory Visit: Payer: Medicare HMO

## 2022-12-04 ENCOUNTER — Ambulatory Visit (INDEPENDENT_AMBULATORY_CARE_PROVIDER_SITE_OTHER): Payer: Medicare HMO | Admitting: *Deleted

## 2022-12-04 DIAGNOSIS — T63481A Toxic effect of venom of other arthropod, accidental (unintentional), initial encounter: Secondary | ICD-10-CM

## 2022-12-05 ENCOUNTER — Ambulatory Visit: Payer: Medicare HMO | Admitting: Internal Medicine

## 2022-12-10 DIAGNOSIS — I10 Essential (primary) hypertension: Secondary | ICD-10-CM | POA: Diagnosis not present

## 2022-12-10 DIAGNOSIS — E782 Mixed hyperlipidemia: Secondary | ICD-10-CM | POA: Diagnosis not present

## 2022-12-10 DIAGNOSIS — E119 Type 2 diabetes mellitus without complications: Secondary | ICD-10-CM | POA: Diagnosis not present

## 2022-12-10 DIAGNOSIS — Z87442 Personal history of urinary calculi: Secondary | ICD-10-CM | POA: Diagnosis not present

## 2022-12-10 DIAGNOSIS — E669 Obesity, unspecified: Secondary | ICD-10-CM | POA: Diagnosis not present

## 2022-12-10 DIAGNOSIS — E559 Vitamin D deficiency, unspecified: Secondary | ICD-10-CM | POA: Diagnosis not present

## 2022-12-18 ENCOUNTER — Encounter: Payer: Self-pay | Admitting: Internal Medicine

## 2022-12-18 ENCOUNTER — Ambulatory Visit: Payer: Medicare HMO | Admitting: Internal Medicine

## 2022-12-18 VITALS — BP 122/76 | HR 76 | Temp 98.1°F | Resp 16 | Ht 70.0 in | Wt 219.4 lb

## 2022-12-18 DIAGNOSIS — I10 Essential (primary) hypertension: Secondary | ICD-10-CM

## 2022-12-18 DIAGNOSIS — E119 Type 2 diabetes mellitus without complications: Secondary | ICD-10-CM

## 2022-12-18 DIAGNOSIS — E7800 Pure hypercholesterolemia, unspecified: Secondary | ICD-10-CM

## 2022-12-18 NOTE — Assessment & Plan Note (Signed)
We will check his HgBA1c.  I rather his A1c be less than 6.5% due to risk of microvascular disease.

## 2022-12-18 NOTE — Assessment & Plan Note (Signed)
I ran his calcium score through the [employer] predictor from his BP and cholesterol from 08/2022.  He had a MESA score of 31.9% for his 10 year risk of a CHD event.  I told him he needs to be on his crestor and if this does not control him, we can consider a PSK9 inhibitor.  He has noncompliance taking his crestor as asked by myself and cardiology.  He states he will restart it.

## 2022-12-18 NOTE — Progress Notes (Signed)
Office Visit  Subjective   Patient ID: Alex Castillo   DOB: 1945/11/06   Age: 77 y.o.   MRN: [id number]   Chief Complaint Chief Complaint  Patient presents with   Follow-up     History of Present Illness The patient is a 77 year old Caucasian/White male who returns for a follow-up visit for his T2 diabetes.  He was diagnosed with new onset diabetes on 03/2018 with a HgBA1c of 9.7%.  Over the interim, he states he has not had any problems.  Last year, his diabetes was not controlled and we increased his metformin ER from 500mg  daily to 500mg  BID.  He remains on Metformin ER 500 mg po BID. He is exercising with using the elliptical at the gym 3 times a week and he plays tennis.  He specifically denies unexplained abdominal pain, nausea or vomiting, or documented hypoglycemia.  He is checking his FSBS 2-3 times per week and his sugars have been running 100-150.   He came in fasting today in anticipation of lab work.  His last HgBA1c was done 3 months ago and was 6.9%. There are no long term complications of diabetes with no diabetic retinopathy, neuropathy, nephropathy or cardiovascular disease.  He had a dilated eye exam for his yearly diabetic eye exam done on 08/30/2021 and there was no evidence of diabetic retinopathy.  Over the interim, he did see [facility] for his yearly eye exam but I do not have this report.   The patient is a 77 year old Caucasian/White male who presents for a follow-up evaluation of hypertension. Last year, his BP was not controlled and we restarted HCTZ 12.5mg  daily.   The patient has been checking his blood pressure at home.  His systolic BP's have been running in the 130's. The patient's current medications include: Cozaar 100 mg daily, HCTZ 12.5mg  daily and amlodipine 10mg  daily.  The patient has been tolerating his medications well. The patient denies any headache, visual changes, dizziness, lightheadness, chest pain, shortness of breath, weakness/numbness, and  edema.   Alex Castillo is a 77 yo male who returns today for routine followup on his cholesterol.   On his last visit, he had admitted he was not filling or taking is crestor.  We repeated a FLP and his LDL was 111 on 08/2022.  The patient was seen by his cardiologist last year where they were going to change him from crestor to zocor due to him having some myalgias.  This past year he was contacted by [facility] upstream where they noted he was not filling his crestor.  He told them that he was having myalgias from his crestor and he stopped the medication.  He had restarted his crestor where he is supposed to be taking it 3 times per week and denied any myalgias with this dosing.  However, he stopped taking as described above.  On his last visit 3 months ago, I asked him to restart his crestor 5mg  3 times per week but he states he did not do this as he went to see cardiology and wanted to see "the results of his artery scans".  His cardiologist ordered a CT cardiac calcium scoring which was done on 12/03/2022 and showed a coronary calcium score of 446. He also had a mildly dilated ascending aorta up to 40 mm.  Overall, he states he is doing well and is without any complaints or problems at this time. He specifically denies nausea, vomiting, diarrhea, myalgias,  and fatigue. He remains on dietary management as well as fish oil and coenzyme Q10. He is fasting in anticipation for labs today.      Past Medical History Past Medical History:  Diagnosis Date   Adenomatous polyps    Anxiety    Arthritis    Asthma    BPH (benign prostatic hyperplasia)    Cancer (HCC)    basal cell on nose   Complication of anesthesia    slow to wake up trouble voiding after a back surgery    Diabetes (HCC)    Diverticulosis    ED (erectile dysfunction)    Esophageal stricture    First degree AV block    GERD (gastroesophageal reflux disease)    Hiatal hernia    History of kidney stones    History of peptic ulcer    HTN  (hypertension)    ETT negative 10/08   Hymenoptera allergy    Hypercholesterolemia    Internal hemorrhoids    Palpitations    Staph infection    in right elbow   T2DM (type 2 diabetes mellitus) (HCC)    Unspecified gastritis and gastroduodenitis without mention of hemorrhage 02/19/2013     Allergies Allergies  Allergen Reactions   Bee Venom Anaphylaxis   Hydromorphone Hcl Nausea And Vomiting     Medications  Current Outpatient Medications:    glucose blood test strip, Use as instructed, Disp: 100 each, Rfl: 12   albuterol (PROAIR HFA) 108 (90 Base) MCG/ACT inhaler, Inhale two puffs every 4-6 hours if needed for cough or wheeze., Disp: 1 each, Rfl: 1   amLODipine (NORVASC) 10 MG tablet, TAKE 1 TABLET BY MOUTH ONCE DAILY, Disp: 90 tablet, Rfl: 1   Ascorbic Acid (VITAMIN C PO), Take by mouth daily., Disp: , Rfl:    aspirin 81 MG tablet, Take 81 mg by mouth 2 (two) times daily., Disp: , Rfl:    Cholecalciferol (VITAMIN D3) 5000 units CAPS, Take 10,000 Units by mouth daily., Disp: , Rfl:    clonazePAM (KLONOPIN) 0.5 MG tablet, Take 1 tablet (0.5 mg total) by mouth daily as needed., Disp: 30 tablet, Rfl: 1   Coenzyme Q10 (COQ-10 PO), Take 100 mg by mouth. Take 2 daily, Disp: , Rfl:    EPINEPHrine 0.3 mg/0.3 mL IJ SOAJ injection, Use as directed for life-threatening allergic reaction., Disp: 2 each, Rfl: 3   fluticasone-salmeterol (ADVAIR DISKUS) 100-50 MCG/ACT AEPB, Inhale one puff one to two times daily as directed during asthma flare-ups.  Rinse, gargle, and spit after use., Disp: 60 each, Rfl: 11   GARLIC PO, Take by mouth daily., Disp: , Rfl:    hydrochlorothiazide (HYDRODIURIL) 12.5 MG tablet, TAKE 1 TABLET(12.5 MG) BY MOUTH EVERY DAY, Disp: 90 tablet, Rfl: 0   Lancets (ONETOUCH ULTRASOFT) lancets, Check FSBS twice daily, Disp: 100 each, Rfl: 12   losartan (COZAAR) 100 MG tablet, TAKE 1 TABLET(100 MG) BY MOUTH DAILY, Disp: 30 tablet, Rfl: 0   metFORMIN (GLUCOPHAGE-XR) 500 MG 24 hr  tablet, TAKE 1 TABLET BY MOUTH TWICE DAILY, Disp: 180 tablet, Rfl: 1   Multiple Vitamin (MULTIVITAMIN PO), Take by mouth daily., Disp: , Rfl:    Multiple Vitamins-Minerals (ZINC PO), Take by mouth daily., Disp: , Rfl:    nystatin-triamcinolone ointment (MYCOLOG), Apply 1 application topically 2 (two) times daily. (Patient not taking: Reported on 11/12/2022), Disp: 30 g, Rfl: 0   Omega-3 Fatty Acids (FISH OIL) 1200 MG CAPS, Take 1,200 mg by mouth daily., Disp: , Rfl:  RABEprazole (ACIPHEX) 20 MG tablet, TAKE 1 TABLET BY MOUTH EVERY DAY, Disp: 90 tablet, Rfl: 0   rosuvastatin (CRESTOR) 5 MG tablet, TAKE 1 TABLET(5 MG) BY MOUTH 3 TIMES EVERY WEEK, Disp: 90 tablet, Rfl: 0   sertraline (ZOLOFT) 50 MG tablet, Take 50 mg by mouth daily., Disp: , Rfl:    sildenafil (REVATIO) 20 MG tablet, SMARTSIG:2-5 Tablet(s) By Mouth, Disp: , Rfl:    Triamcinolone Acetonide (NASACORT ALLERGY 24HR NA), Place 2 sprays into the nose daily as needed (allergies).  (Patient not taking: Reported on 11/12/2022), Disp: , Rfl:    TRUE METRIX BLOOD GLUCOSE TEST test strip, daily., Disp: , Rfl:    vitamin E 400 UNIT capsule, Take 400 Units by mouth daily., Disp: , Rfl:    Review of Systems Review of Systems  Constitutional:  Negative for chills and fever.  Eyes:  Negative for blurred vision and double vision.  Respiratory:  Negative for cough and shortness of breath.   Cardiovascular:  Positive for chest pain. Negative for palpitations and leg swelling.  Gastrointestinal:  Negative for abdominal pain, constipation, diarrhea, nausea and vomiting.  Genitourinary:  Negative for frequency.  Musculoskeletal:  Negative for myalgias.  Skin:  Negative for itching and rash.  Neurological:  Negative for dizziness, weakness and headaches.  Endo/Heme/Allergies:  Negative for polydipsia.       Objective:    Vitals BP 122/76   Pulse 76   Temp 98.1 F (36.7 C)   Resp 16   Ht 5\' 10"  (1.778 m)   Wt 219 lb 6.4 oz (99.5 kg)   SpO2  95%   BMI 31.48 kg/m    Physical Examination Physical Exam Constitutional:      Appearance: Normal appearance. He is not ill-appearing.  Neck:     Vascular: No carotid bruit.  Cardiovascular:     Rate and Rhythm: Normal rate and regular rhythm.     Pulses: Normal pulses.     Heart sounds: No murmur heard.    No friction rub. No gallop.  Pulmonary:     Effort: Pulmonary effort is normal. No respiratory distress.     Breath sounds: No wheezing, rhonchi or rales.  Abdominal:     General: Abdomen is flat. Bowel sounds are normal. There is no distension.     Palpations: Abdomen is soft.     Tenderness: There is no abdominal tenderness.  Musculoskeletal:     Right lower leg: No edema.     Left lower leg: No edema.  Skin:    General: Skin is warm and dry.     Findings: No rash.  Neurological:     General: No focal deficit present.     Mental Status: He is alert and oriented to person, place, and time.  Psychiatric:        Mood and Affect: Mood normal.        Behavior: Behavior normal.        Assessment & Plan:   Diabetes mellitus without complication (HCC) We will check his HgBA1c.  I rather his A1c be less than 6.5% due to risk of microvascular disease.    Hypercholesterolemia I ran his calcium score through the MESA predictor from his BP and cholesterol from 08/2022.  He had a MESA score of 31.9% for his 10 year risk of a CHD event.  I told him he needs to be on his crestor and if this does not control him, we can consider a PSK9 inhibitor.  He  has noncompliance taking his crestor as asked by myself and cardiology.  He states he will restart it.    Return in about 3 months (around 03/20/2023).   Crist Fat, MD

## 2022-12-19 ENCOUNTER — Other Ambulatory Visit: Payer: Self-pay

## 2022-12-19 MED ORDER — ROSUVASTATIN CALCIUM 5 MG PO TABS: 5.0000 mg | ORAL_TABLET | ORAL | 1 refills | Status: DC

## 2022-12-19 NOTE — Progress Notes (Signed)
Patient notified

## 2022-12-21 ENCOUNTER — Other Ambulatory Visit: Payer: Self-pay | Admitting: Cardiovascular Disease

## 2022-12-23 ENCOUNTER — Other Ambulatory Visit: Payer: Self-pay | Admitting: Internal Medicine

## 2023-01-28 ENCOUNTER — Ambulatory Visit (INDEPENDENT_AMBULATORY_CARE_PROVIDER_SITE_OTHER): Payer: Medicare HMO | Admitting: *Deleted

## 2023-01-28 DIAGNOSIS — T63481D Toxic effect of venom of other arthropod, accidental (unintentional), subsequent encounter: Secondary | ICD-10-CM | POA: Diagnosis not present

## 2023-01-28 DIAGNOSIS — T63481A Toxic effect of venom of other arthropod, accidental (unintentional), initial encounter: Secondary | ICD-10-CM

## 2023-01-29 ENCOUNTER — Ambulatory Visit: Payer: Medicare HMO

## 2023-02-13 ENCOUNTER — Other Ambulatory Visit: Payer: Self-pay | Admitting: Internal Medicine

## 2023-02-19 ENCOUNTER — Other Ambulatory Visit: Payer: Self-pay | Admitting: Internal Medicine

## 2023-02-25 ENCOUNTER — Other Ambulatory Visit: Payer: Self-pay | Admitting: Internal Medicine

## 2023-02-26 ENCOUNTER — Other Ambulatory Visit: Payer: Self-pay | Admitting: Internal Medicine

## 2023-02-26 MED ORDER — CLONAZEPAM 0.5 MG PO TABS: 0.5000 mg | ORAL_TABLET | Freq: Every day | ORAL | 1 refills | Status: DC | PRN

## 2023-03-12 ENCOUNTER — Other Ambulatory Visit: Payer: Self-pay | Admitting: Cardiovascular Disease

## 2023-03-21 ENCOUNTER — Encounter: Payer: Self-pay | Admitting: Internal Medicine

## 2023-03-21 ENCOUNTER — Ambulatory Visit: Payer: Medicare HMO | Admitting: Internal Medicine

## 2023-03-21 VITALS — BP 132/80 | HR 71 | Temp 98.6°F | Resp 18 | Ht 70.0 in | Wt 220.6 lb

## 2023-03-21 DIAGNOSIS — E1142 Type 2 diabetes mellitus with diabetic polyneuropathy: Secondary | ICD-10-CM | POA: Insufficient documentation

## 2023-03-21 DIAGNOSIS — E7800 Pure hypercholesterolemia, unspecified: Secondary | ICD-10-CM

## 2023-03-21 DIAGNOSIS — I10 Essential (primary) hypertension: Secondary | ICD-10-CM

## 2023-03-21 NOTE — Assessment & Plan Note (Signed)
He is now on his crestor.  WE will check his FLP today.

## 2023-03-21 NOTE — Assessment & Plan Note (Signed)
His BP is controlled.  We will continue on his current BP meds.

## 2023-03-21 NOTE — Progress Notes (Unsigned)
Office Visit  Subjective   Patient ID: Alex Castillo   DOB: [birth year]   Age: 77 y.o.   MRN: [id number]   Chief Complaint Chief Complaint  Patient presents with   Follow-up     History of Present Illness The patient is a 77 year old Caucasian/White male who returns for a follow-up visit for his T2 diabetes.  He was diagnosed with new onset diabetes on 03/2018 with a HgBA1c of 9.7%.  Over the interim, he states he has not had any problems.  Last year, his diabetes was not controlled and we increased his metformin ER from 500mg  daily to 500mg  BID.  He remains on Metformin ER 500 mg po BID. He is exercising with using the elliptical at the gym 3 times a week and he plays tennis.  He specifically denies unexplained abdominal pain, nausea or vomiting, or documented hypoglycemia.  He is checking his FSBS 2-3 times per week and his sugars have been running 120-150.   He came in fasting today in anticipation of lab work.  His last HgBA1c was done 3 months ago and was 6.8%. There are no long term complications of diabetes with no diabetic retinopathy, neuropathy, nephropathy or cardiovascular disease.  He had a yearly diabetic dilated eye exam done by Dr. Abelina Bachelor at [facility] on 09/03/2022 and there was no evidence of diabetic retinopathy.   The patient is a 77 year old Caucasian/White male who presents for a follow-up evaluation of hypertension. Since his last visit, he has not had any problems.  Last year, his BP was not controlled and we restarted HCTZ 12.5mg  daily.   The patient has been checking his blood pressure at home.  His systolic BP's have been running in the 130's. The patient's current medications include: Cozaar 100 mg daily, HCTZ 12.5mg  daily and amlodipine 10mg  daily.  The patient has been tolerating his medications well. The patient denies any headache, visual changes, dizziness, lightheadness, chest pain, shortness of breath, weakness/numbness, and edema.   Alex Castillo is a 77  yo male who returns today for routine followup on his cholesterol.   On his last visit, he had noncompliance with his crestor.  I ran his calcium score through the [facility] predictor from his BP and cholesterol from 08/2022.  He had a MESA score of 31.9% for his 10 year risk of a CHD event.  I told him on 12/2022 that he needed to be on his crestor and if this does not control him, we would consider a PSK9 inhibitor.  Over the interim, he states he has restarted his crestor.  The patient was seen by his cardiologist last year where they were going to change him from crestor to zocor due to him having some myalgias.  This past year he was contacted by [facility] where they noted he was not filling his crestor.  He told them that he was having myalgias from his crestor and he stopped the medication.  He had restarted his crestor where he is supposed to be taking it 3 times per week and denied any myalgias with this dosing.  However, he stopped taking as described above.  On his last visit 3 months ago, I asked him to restart his crestor 5mg  3 times per week but he states he did not do this as he went to see cardiology and wanted to see "the results of his artery scans".  His cardiologist ordered a CT cardiac calcium scoring which was done  on 12/03/2022 and showed a coronary calcium score of 446. He also had a mildly dilated ascending aorta up to 40 mm.  Overall, he states he is doing well and is without any complaints or problems at this time. He specifically denies nausea, vomiting, diarrhea, myalgias, and fatigue. He remains on dietary management as well as fish oil, coenzyme Q10 and crestor 5mg  qhs. He is fasting in anticipation for labs today.      Past Medical History Past Medical History:  Diagnosis Date   Adenomatous polyps    Anxiety    Arthritis    Asthma    BPH (benign prostatic hyperplasia)    Cancer (HCC)    basal cell on nose   Complication of anesthesia    slow to wake up trouble voiding after  a back surgery    Diabetes (HCC)    Diverticulosis    ED (erectile dysfunction)    Esophageal stricture    First degree AV block    GERD (gastroesophageal reflux disease)    Hiatal hernia    History of kidney stones    History of peptic ulcer    HTN (hypertension)    ETT negative 10/08   Hymenoptera allergy    Hypercholesterolemia    Internal hemorrhoids    Palpitations    Staph infection    in right elbow   T2DM (type 2 diabetes mellitus) (HCC)    Unspecified gastritis and gastroduodenitis without mention of hemorrhage 02/19/2013     Allergies Allergies  Allergen Reactions   Bee Venom Anaphylaxis   Hydromorphone Hcl Nausea And Vomiting     Medications  Current Outpatient Medications:    glucose blood test strip, Use as instructed, Disp: 100 each, Rfl: 12   albuterol (PROAIR HFA) 108 (90 Base) MCG/ACT inhaler, Inhale two puffs every 4-6 hours if needed for cough or wheeze., Disp: 1 each, Rfl: 1   amLODipine (NORVASC) 10 MG tablet, TAKE 1 TABLET BY MOUTH EVERY DAY, Disp: 90 tablet, Rfl: 2   Ascorbic Acid (VITAMIN C PO), Take by mouth daily., Disp: , Rfl:    aspirin 81 MG tablet, Take 81 mg by mouth 2 (two) times daily., Disp: , Rfl:    Cholecalciferol (VITAMIN D3) 5000 units CAPS, Take 10,000 Units by mouth daily., Disp: , Rfl:    clonazePAM (KLONOPIN) 0.5 MG tablet, Take 1 tablet (0.5 mg total) by mouth daily as needed., Disp: 30 tablet, Rfl: 1   Coenzyme Q10 (COQ-10 PO), Take 100 mg by mouth. Take 2 daily, Disp: , Rfl:    EPINEPHrine 0.3 mg/0.3 mL IJ SOAJ injection, Use as directed for life-threatening allergic reaction., Disp: 2 each, Rfl: 3   fluticasone-salmeterol (ADVAIR DISKUS) 100-50 MCG/ACT AEPB, Inhale one puff one to two times daily as directed during asthma flare-ups.  Rinse, gargle, and spit after use., Disp: 60 each, Rfl: 11   GARLIC PO, Take by mouth daily., Disp: , Rfl:    hydrochlorothiazide (HYDRODIURIL) 12.5 MG tablet, TAKE 1 TABLET(12.5 MG) BY MOUTH EVERY  DAY, Disp: 90 tablet, Rfl: 0   Lancets (ONETOUCH ULTRASOFT) lancets, Check FSBS twice daily, Disp: 100 each, Rfl: 12   losartan (COZAAR) 100 MG tablet, TAKE 1 TABLET(100 MG) BY MOUTH DAILY, Disp: 30 tablet, Rfl: 11   metFORMIN (GLUCOPHAGE-XR) 500 MG 24 hr tablet, TAKE 1 TABLET BY MOUTH TWICE DAILY, Disp: 180 tablet, Rfl: 1   Multiple Vitamin (MULTIVITAMIN PO), Take by mouth daily., Disp: , Rfl:    Multiple Vitamins-Minerals (ZINC PO), Take  by mouth daily., Disp: , Rfl:    nystatin-triamcinolone ointment (MYCOLOG), Apply 1 application topically 2 (two) times daily. (Patient not taking: Reported on 11/12/2022), Disp: 30 g, Rfl: 0   Omega-3 Fatty Acids (FISH OIL) 1200 MG CAPS, Take 1,200 mg by mouth daily., Disp: , Rfl:    RABEprazole (ACIPHEX) 20 MG tablet, TAKE 1 TABLET BY MOUTH EVERY DAY, Disp: 90 tablet, Rfl: 0   rosuvastatin (CRESTOR) 5 MG tablet, Take 1 tablet (5 mg total) by mouth 3 (three) times a week., Disp: 90 tablet, Rfl: 1   sertraline (ZOLOFT) 50 MG tablet, TAKE ONE(1) TABLET BY MOUTH ONCE DAILY, Disp: 90 tablet, Rfl: 0   sildenafil (REVATIO) 20 MG tablet, SMARTSIG:2-5 Tablet(s) By Mouth, Disp: , Rfl:    Triamcinolone Acetonide (NASACORT ALLERGY 24HR NA), Place 2 sprays into the nose daily as needed (allergies).  (Patient not taking: Reported on 11/12/2022), Disp: , Rfl:    TRUE METRIX BLOOD GLUCOSE TEST test strip, daily., Disp: , Rfl:    vitamin E 400 UNIT capsule, Take 400 Units by mouth daily., Disp: , Rfl:    Review of Systems Review of Systems  Constitutional:  Negative for chills and fever.  Eyes:  Negative for blurred vision and double vision.  Respiratory:  Negative for cough and shortness of breath.   Cardiovascular:  Negative for chest pain, palpitations and leg swelling.  Gastrointestinal:  Negative for abdominal pain, constipation, diarrhea, nausea and vomiting.  Genitourinary:  Negative for frequency.  Musculoskeletal:  Negative for myalgias.  Skin:  Negative for itching  and rash.  Neurological:  Negative for dizziness, weakness and headaches.  Endo/Heme/Allergies:  Negative for polydipsia.       Objective:    Vitals BP 132/80   Pulse 71   Temp 98.6 F (37 C)   Resp 18   Ht 5\' 10"  (1.778 m)   Wt 220 lb 9.6 oz (100.1 kg)   SpO2 95%   BMI 31.65 kg/m    Physical Examination Physical Exam Constitutional:      Appearance: Normal appearance. He is not ill-appearing.  Cardiovascular:     Rate and Rhythm: Normal rate and regular rhythm.     Pulses: Normal pulses.     Heart sounds: No murmur heard.    No friction rub. No gallop.  Pulmonary:     Effort: Pulmonary effort is normal. No respiratory distress.     Breath sounds: No wheezing, rhonchi or rales.  Abdominal:     General: Abdomen is flat. Bowel sounds are normal. There is no distension.     Palpations: Abdomen is soft.     Tenderness: There is no abdominal tenderness.  Musculoskeletal:     Right lower leg: No edema.     Left lower leg: No edema.  Skin:    General: Skin is warm and dry.     Findings: No rash.  Neurological:     General: No focal deficit present.     Mental Status: He is alert and oriented to person, place, and time.  Psychiatric:        Mood and Affect: Mood normal.        Assessment & Plan:   Essential hypertension His BP is controlled.  We will continue on his current BP meds.  Diabetic polyneuropathy associated with type 2 diabetes mellitus (HCC) On his foot exam today, he has decreased sensation to both his feet to vibratory sense.  I think he has some neuropathy at this point.  He has no pain.  We will continue control of his diabetes.  I discussed foot care with him.  Hypercholesterolemia He is now on his crestor.  WE will check his FLP today.    Return in about 3 months (around 06/21/2023).   Crist Fat, MD

## 2023-03-21 NOTE — Assessment & Plan Note (Signed)
On his foot exam today, he has decreased sensation to both his feet to vibratory sense.  I think he has some neuropathy at this point.  He has no pain.  We will continue control of his diabetes.  I discussed foot care with him.

## 2023-03-22 LAB — LIPID PANEL
Chol/HDL Ratio: 3.7 ratio (ref 0.0–5.0)
Cholesterol, Total: 147 mg/dL (ref 100–199)
HDL: 40 mg/dL (ref >39)
LDL Chol Calc (NIH): 91 mg/dL (ref 0–99)
Triglycerides: 81 mg/dL (ref 0–149)
VLDL Cholesterol Cal: 16 mg/dL (ref 5–40)

## 2023-03-22 LAB — HEMOGLOBIN A1C
Est. average glucose Bld gHb Est-mCnc: 160 mg/dL
Hgb A1c MFr Bld: 7.2 % — ABNORMAL HIGH (ref 4.8–5.6)

## 2023-03-22 LAB — MICROALBUMIN / CREATININE URINE RATIO
Creatinine, Urine: 63.4 mg/dL
Microalb/Creat Ratio: 17 mg/g{creat} (ref 0–29)
Microalbumin, Urine: 10.8 ug/mL

## 2023-03-25 ENCOUNTER — Ambulatory Visit: Payer: Medicare HMO

## 2023-03-26 ENCOUNTER — Ambulatory Visit (INDEPENDENT_AMBULATORY_CARE_PROVIDER_SITE_OTHER): Payer: Medicare HMO | Admitting: *Deleted

## 2023-03-26 DIAGNOSIS — T63481D Toxic effect of venom of other arthropod, accidental (unintentional), subsequent encounter: Secondary | ICD-10-CM

## 2023-03-26 DIAGNOSIS — T63481A Toxic effect of venom of other arthropod, accidental (unintentional), initial encounter: Secondary | ICD-10-CM

## 2023-04-04 DIAGNOSIS — R69 Illness, unspecified: Secondary | ICD-10-CM | POA: Diagnosis not present

## 2023-04-08 ENCOUNTER — Other Ambulatory Visit: Payer: Self-pay | Admitting: Internal Medicine

## 2023-04-12 ENCOUNTER — Other Ambulatory Visit: Payer: Self-pay | Admitting: Internal Medicine

## 2023-04-16 ENCOUNTER — Other Ambulatory Visit: Payer: Self-pay

## 2023-04-16 MED ORDER — CANAGLIFLOZIN-METFORMIN HCL 150-1000 MG PO TABS: 1.0000 | ORAL_TABLET | Freq: Every day | ORAL | 1 refills | Status: DC

## 2023-04-16 NOTE — Progress Notes (Signed)
Change in mg/metformin, New Rx

## 2023-04-16 NOTE — Progress Notes (Signed)
His diabetes is not fully controlled. Increase his metformin from 500mg  BID to 1000mg  BID  Patient is aware of labs and new mg increase to his metformin

## 2023-04-19 DIAGNOSIS — R69 Illness, unspecified: Secondary | ICD-10-CM | POA: Diagnosis not present

## 2023-05-21 ENCOUNTER — Ambulatory Visit (INDEPENDENT_AMBULATORY_CARE_PROVIDER_SITE_OTHER): Payer: PPO

## 2023-05-21 DIAGNOSIS — Z91038 Other insect allergy status: Secondary | ICD-10-CM

## 2023-05-23 ENCOUNTER — Other Ambulatory Visit: Payer: Self-pay | Admitting: Internal Medicine

## 2023-05-29 ENCOUNTER — Other Ambulatory Visit: Payer: Self-pay | Admitting: Internal Medicine

## 2023-05-29 MED ORDER — CLONAZEPAM 0.5 MG PO TABS: 0.5000 mg | ORAL_TABLET | Freq: Every day | ORAL | 2 refills | Status: DC | PRN

## 2023-06-06 DIAGNOSIS — L570 Actinic keratosis: Secondary | ICD-10-CM | POA: Diagnosis not present

## 2023-06-06 DIAGNOSIS — D1801 Hemangioma of skin and subcutaneous tissue: Secondary | ICD-10-CM | POA: Diagnosis not present

## 2023-06-06 DIAGNOSIS — Z85828 Personal history of other malignant neoplasm of skin: Secondary | ICD-10-CM | POA: Diagnosis not present

## 2023-06-06 DIAGNOSIS — Z08 Encounter for follow-up examination after completed treatment for malignant neoplasm: Secondary | ICD-10-CM | POA: Diagnosis not present

## 2023-06-06 DIAGNOSIS — L821 Other seborrheic keratosis: Secondary | ICD-10-CM | POA: Diagnosis not present

## 2023-06-06 DIAGNOSIS — R229 Localized swelling, mass and lump, unspecified: Secondary | ICD-10-CM | POA: Diagnosis not present

## 2023-06-10 ENCOUNTER — Other Ambulatory Visit: Payer: Self-pay | Admitting: Internal Medicine

## 2023-06-10 MED ORDER — CLONAZEPAM 0.5 MG PO TABS: 0.5000 mg | ORAL_TABLET | Freq: Every day | ORAL | 2 refills | Status: DC | PRN

## 2023-06-11 DIAGNOSIS — E119 Type 2 diabetes mellitus without complications: Secondary | ICD-10-CM | POA: Diagnosis not present

## 2023-06-11 DIAGNOSIS — E669 Obesity, unspecified: Secondary | ICD-10-CM | POA: Diagnosis not present

## 2023-06-11 DIAGNOSIS — Z9181 History of falling: Secondary | ICD-10-CM | POA: Diagnosis not present

## 2023-06-11 DIAGNOSIS — I10 Essential (primary) hypertension: Secondary | ICD-10-CM | POA: Diagnosis not present

## 2023-06-11 DIAGNOSIS — H8110 Benign paroxysmal vertigo, unspecified ear: Secondary | ICD-10-CM | POA: Diagnosis not present

## 2023-06-11 DIAGNOSIS — Z8639 Personal history of other endocrine, nutritional and metabolic disease: Secondary | ICD-10-CM | POA: Diagnosis not present

## 2023-06-11 DIAGNOSIS — E782 Mixed hyperlipidemia: Secondary | ICD-10-CM | POA: Diagnosis not present

## 2023-06-11 DIAGNOSIS — Z87442 Personal history of urinary calculi: Secondary | ICD-10-CM | POA: Diagnosis not present

## 2023-06-24 DIAGNOSIS — R2681 Unsteadiness on feet: Secondary | ICD-10-CM | POA: Diagnosis not present

## 2023-06-27 ENCOUNTER — Ambulatory Visit: Payer: Self-pay | Admitting: Internal Medicine

## 2023-06-27 ENCOUNTER — Encounter: Payer: Self-pay | Admitting: Internal Medicine

## 2023-06-27 VITALS — BP 124/62 | HR 67 | Temp 97.7°F | Resp 18 | Ht 70.0 in | Wt 215.1 lb

## 2023-06-27 DIAGNOSIS — E119 Type 2 diabetes mellitus without complications: Secondary | ICD-10-CM

## 2023-06-27 LAB — HEMOGLOBIN A1C
Est. average glucose Bld gHb Est-mCnc: 140 mg/dL
Hgb A1c MFr Bld: 6.5 % — ABNORMAL HIGH (ref 4.8–5.6)

## 2023-06-27 MED ORDER — METFORMIN HCL ER 500 MG PO TB24: 1000.0000 mg | ORAL_TABLET | Freq: Two times a day (BID) | ORAL | 1 refills | Status: DC

## 2023-06-27 NOTE — Progress Notes (Signed)
Office Visit  Subjective   Patient ID: Alex Castillo   DOB: [birth year]   Age: 78 y.o.   MRN: [id number]   Chief Complaint Chief Complaint  Patient presents with   Follow-up    Follow up DM. Sugars are running 124 -145 at home. He's been watching his diet. He has also been exercising more. He is fasting this morning.     History of Present Illness The patient is a 78 year old Caucasian/White male who returns for a follow-up visit for his T2 diabetes.  He was diagnosed with new onset diabetes on 03/2018 with a HgBA1c of 9.7%.  On his last visit, his diabetes was not fully controlled and we increased his Metformin ER from 500mg  BID to 1000mg  BID.  Over the interim, he states he has not had any problems.  He remains on Metformin ER 1000 mg po BID. He is exercising with using the elliptical at the gym 3 times a week and he plays tennis.  He specifically denies unexplained abdominal pain, nausea or vomiting, or documented hypoglycemia.  He is checking his FSBS 2-3 times per week and his sugars have been running 110-150.   He came in fasting today in anticipation of lab work.  His last HgBA1c was done 3 months ago and was 7.2%. There are no long term complications of diabetes with no diabetic retinopathy, neuropathy, nephropathy or cardiovascular disease.  He had a yearly diabetic dilated eye exam done by Dr. Abelina Bachelor at [facility] on 09/03/2022 and there was no evidence of diabetic retinopathy.      Past Medical History Past Medical History:  Diagnosis Date   Adenomatous polyps    Anxiety    Arthritis    Asthma    BPH (benign prostatic hyperplasia)    Cancer (HCC)    basal cell on nose   Complication of anesthesia    slow to wake up trouble voiding after a back surgery    Diabetes (HCC)    Diverticulosis    ED (erectile dysfunction)    Esophageal stricture    First degree AV block    GERD (gastroesophageal reflux disease)    Hiatal hernia    History of kidney stones    History  of peptic ulcer    HTN (hypertension)    ETT negative 10/08   Hymenoptera allergy    Hypercholesterolemia    Internal hemorrhoids    Palpitations    Staph infection    in right elbow   T2DM (type 2 diabetes mellitus) (HCC)    Unspecified gastritis and gastroduodenitis without mention of hemorrhage 02/19/2013     Allergies Allergies  Allergen Reactions   Bee Venom Anaphylaxis   Hydromorphone Hcl Nausea And Vomiting     Medications  Current Outpatient Medications:    glucose blood test strip, Use as instructed, Disp: 100 each, Rfl: 12   albuterol (PROAIR HFA) 108 (90 Base) MCG/ACT inhaler, Inhale two puffs every 4-6 hours if needed for cough or wheeze., Disp: 1 each, Rfl: 1   amLODipine (NORVASC) 10 MG tablet, TAKE 1 TABLET BY MOUTH EVERY DAY, Disp: 90 tablet, Rfl: 2   Ascorbic Acid (VITAMIN C PO), Take by mouth daily., Disp: , Rfl:    aspirin 81 MG tablet, Take 81 mg by mouth 2 (two) times daily., Disp: , Rfl:    Canagliflozin-metFORMIN HCl [phone] MG TABS, Take 1 tablet by mouth daily., Disp: 90 tablet, Rfl: 1   Cholecalciferol (VITAMIN D3) 5000 units CAPS,  Take 10,000 Units by mouth daily., Disp: , Rfl:    clonazePAM (KLONOPIN) 0.5 MG tablet, Take 1 tablet (0.5 mg total) by mouth daily as needed., Disp: 30 tablet, Rfl: 2   Coenzyme Q10 (COQ-10 PO), Take 100 mg by mouth. Take 2 daily, Disp: , Rfl:    EPINEPHrine 0.3 mg/0.3 mL IJ SOAJ injection, Use as directed for life-threatening allergic reaction., Disp: 2 each, Rfl: 3   fluticasone-salmeterol (ADVAIR DISKUS) 100-50 MCG/ACT AEPB, Inhale one puff one to two times daily as directed during asthma flare-ups.  Rinse, gargle, and spit after use., Disp: 60 each, Rfl: 11   GARLIC PO, Take by mouth daily., Disp: , Rfl:    hydrochlorothiazide (HYDRODIURIL) 12.5 MG tablet, TAKE 1 TABLET(12.5 MG) BY MOUTH EVERY DAY, Disp: 90 tablet, Rfl: 0   Lancets (ONETOUCH ULTRASOFT) lancets, Check FSBS twice daily, Disp: 100 each, Rfl: 12   losartan  (COZAAR) 100 MG tablet, TAKE 1 TABLET(100 MG) BY MOUTH DAILY, Disp: 30 tablet, Rfl: 11   Multiple Vitamin (MULTIVITAMIN PO), Take by mouth daily., Disp: , Rfl:    Multiple Vitamins-Minerals (ZINC PO), Take by mouth daily., Disp: , Rfl:    nystatin-triamcinolone ointment (MYCOLOG), Apply 1 application topically 2 (two) times daily. (Patient not taking: Reported on 11/12/2022), Disp: 30 g, Rfl: 0   Omega-3 Fatty Acids (FISH OIL) 1200 MG CAPS, Take 1,200 mg by mouth daily., Disp: , Rfl:    RABEprazole (ACIPHEX) 20 MG tablet, TAKE 1 TABLET BY MOUTH EVERY DAY, Disp: 90 tablet, Rfl: 0   rosuvastatin (CRESTOR) 5 MG tablet, TAKE 1 TABLET BY MOUTH 3 TIMES A WEEK, Disp: 90 tablet, Rfl: 1   sertraline (ZOLOFT) 50 MG tablet, TAKE 1 TABLET BY MOUTH DAILY, Disp: 90 tablet, Rfl: 0   sildenafil (REVATIO) 20 MG tablet, SMARTSIG:2-5 Tablet(s) By Mouth, Disp: , Rfl:    Triamcinolone Acetonide (NASACORT ALLERGY 24HR NA), Place 2 sprays into the nose daily as needed (allergies).  (Patient not taking: Reported on 11/12/2022), Disp: , Rfl:    TRUE METRIX BLOOD GLUCOSE TEST test strip, daily., Disp: , Rfl:    vitamin E 400 UNIT capsule, Take 400 Units by mouth daily., Disp: , Rfl:    Review of Systems Review of Systems  Constitutional:  Negative for chills and fever.  Eyes:  Negative for blurred vision.  Respiratory:  Negative for shortness of breath.   Cardiovascular:  Negative for chest pain and palpitations.  Gastrointestinal:  Negative for abdominal pain, constipation, diarrhea, nausea and vomiting.  Genitourinary:  Negative for frequency.  Neurological:  Negative for dizziness, weakness and headaches.  Endo/Heme/Allergies:  Negative for polydipsia.       Objective:    Vitals BP 124/62 (BP Location: Left Arm, Patient Position: Sitting, Cuff Size: Normal)   Pulse 67   Temp 97.7 F (36.5 C) (Temporal)   Resp 18   Ht 5\' 10"  (1.778 m)   Wt 215 lb 2 oz (97.6 kg)   SpO2 96%   BMI 30.87 kg/m    Physical  Examination Physical Exam Constitutional:      Appearance: Normal appearance. He is not ill-appearing.  Cardiovascular:     Rate and Rhythm: Normal rate and regular rhythm.     Pulses: Normal pulses.     Heart sounds: No murmur heard.    No friction rub. No gallop.  Pulmonary:     Effort: Pulmonary effort is normal. No respiratory distress.     Breath sounds: No wheezing, rhonchi or rales.  Abdominal:     General: Abdomen is flat. Bowel sounds are normal. There is no distension.     Palpations: Abdomen is soft.     Tenderness: There is no abdominal tenderness.  Musculoskeletal:     Right lower leg: No edema.     Left lower leg: No edema.  Skin:    General: Skin is warm and dry.     Findings: No rash.  Neurological:     Mental Status: He is alert.        Assessment & Plan:   Diabetes mellitus without complication (HCC) We will recheck his HgBa1c at this time.  His goal <7%.  Continue diet with exercise and his metformin.    Return in about 3 months (around 09/24/2023) for annual.   Crist Fat, MD

## 2023-06-27 NOTE — Assessment & Plan Note (Signed)
We will recheck his HgBa1c at this time.  His goal <7%.  Continue diet with exercise and his metformin.

## 2023-07-02 DIAGNOSIS — R2681 Unsteadiness on feet: Secondary | ICD-10-CM | POA: Diagnosis not present

## 2023-07-09 DIAGNOSIS — R2681 Unsteadiness on feet: Secondary | ICD-10-CM | POA: Diagnosis not present

## 2023-07-09 NOTE — Progress Notes (Signed)
 His diabetes is controlled.  Unable to reach patient, mailbox has not been set up

## 2023-07-16 ENCOUNTER — Ambulatory Visit (INDEPENDENT_AMBULATORY_CARE_PROVIDER_SITE_OTHER): Payer: Self-pay

## 2023-07-16 DIAGNOSIS — R2681 Unsteadiness on feet: Secondary | ICD-10-CM | POA: Diagnosis not present

## 2023-07-16 DIAGNOSIS — Z91038 Other insect allergy status: Secondary | ICD-10-CM

## 2023-07-23 DIAGNOSIS — R2681 Unsteadiness on feet: Secondary | ICD-10-CM | POA: Diagnosis not present

## 2023-07-26 ENCOUNTER — Other Ambulatory Visit: Payer: Self-pay | Admitting: Internal Medicine

## 2023-07-30 DIAGNOSIS — R2681 Unsteadiness on feet: Secondary | ICD-10-CM | POA: Diagnosis not present

## 2023-08-07 DIAGNOSIS — R2681 Unsteadiness on feet: Secondary | ICD-10-CM | POA: Diagnosis not present

## 2023-08-10 ENCOUNTER — Other Ambulatory Visit: Payer: Self-pay | Admitting: Internal Medicine

## 2023-09-04 ENCOUNTER — Other Ambulatory Visit: Payer: Self-pay

## 2023-09-04 MED ORDER — HYDROCHLOROTHIAZIDE 12.5 MG PO TABS: 12.5000 mg | ORAL_TABLET | Freq: Every day | ORAL | 2 refills | Status: AC

## 2023-09-09 DIAGNOSIS — E119 Type 2 diabetes mellitus without complications: Secondary | ICD-10-CM | POA: Diagnosis not present

## 2023-09-10 ENCOUNTER — Ambulatory Visit (INDEPENDENT_AMBULATORY_CARE_PROVIDER_SITE_OTHER)

## 2023-09-10 DIAGNOSIS — E119 Type 2 diabetes mellitus without complications: Secondary | ICD-10-CM | POA: Diagnosis not present

## 2023-09-10 DIAGNOSIS — Z91038 Other insect allergy status: Secondary | ICD-10-CM | POA: Diagnosis not present

## 2023-09-10 DIAGNOSIS — I10 Essential (primary) hypertension: Secondary | ICD-10-CM | POA: Diagnosis not present

## 2023-09-10 DIAGNOSIS — Z87442 Personal history of urinary calculi: Secondary | ICD-10-CM | POA: Diagnosis not present

## 2023-09-10 DIAGNOSIS — E669 Obesity, unspecified: Secondary | ICD-10-CM | POA: Diagnosis not present

## 2023-09-10 DIAGNOSIS — E782 Mixed hyperlipidemia: Secondary | ICD-10-CM | POA: Diagnosis not present

## 2023-09-18 ENCOUNTER — Other Ambulatory Visit: Payer: Self-pay | Admitting: Internal Medicine

## 2023-10-03 ENCOUNTER — Ambulatory Visit: Payer: PPO | Admitting: Internal Medicine

## 2023-10-03 ENCOUNTER — Encounter: Payer: Self-pay | Admitting: Internal Medicine

## 2023-10-03 VITALS — BP 134/72 | HR 65 | Temp 98.2°F | Resp 16 | Wt 215.0 lb

## 2023-10-03 DIAGNOSIS — J4530 Mild persistent asthma, uncomplicated: Secondary | ICD-10-CM

## 2023-10-03 DIAGNOSIS — M5030 Other cervical disc degeneration, unspecified cervical region: Secondary | ICD-10-CM | POA: Insufficient documentation

## 2023-10-03 DIAGNOSIS — F411 Generalized anxiety disorder: Secondary | ICD-10-CM | POA: Diagnosis not present

## 2023-10-03 DIAGNOSIS — K219 Gastro-esophageal reflux disease without esophagitis: Secondary | ICD-10-CM | POA: Diagnosis not present

## 2023-10-03 DIAGNOSIS — I10 Essential (primary) hypertension: Secondary | ICD-10-CM | POA: Diagnosis not present

## 2023-10-03 DIAGNOSIS — E1142 Type 2 diabetes mellitus with diabetic polyneuropathy: Secondary | ICD-10-CM

## 2023-10-03 DIAGNOSIS — E6609 Other obesity due to excess calories: Secondary | ICD-10-CM | POA: Diagnosis not present

## 2023-10-03 DIAGNOSIS — N400 Enlarged prostate without lower urinary tract symptoms: Secondary | ICD-10-CM | POA: Diagnosis not present

## 2023-10-03 DIAGNOSIS — J302 Other seasonal allergic rhinitis: Secondary | ICD-10-CM | POA: Diagnosis not present

## 2023-10-03 DIAGNOSIS — I2510 Atherosclerotic heart disease of native coronary artery without angina pectoris: Secondary | ICD-10-CM | POA: Insufficient documentation

## 2023-10-03 DIAGNOSIS — E66811 Obesity, class 1: Secondary | ICD-10-CM | POA: Diagnosis not present

## 2023-10-03 DIAGNOSIS — Z6830 Body mass index (BMI) 30.0-30.9, adult: Secondary | ICD-10-CM | POA: Diagnosis not present

## 2023-10-03 DIAGNOSIS — Z91038 Other insect allergy status: Secondary | ICD-10-CM

## 2023-10-03 DIAGNOSIS — Z0000 Encounter for general adult medical examination without abnormal findings: Secondary | ICD-10-CM | POA: Diagnosis not present

## 2023-10-03 DIAGNOSIS — Z1211 Encounter for screening for malignant neoplasm of colon: Secondary | ICD-10-CM

## 2023-10-03 DIAGNOSIS — I77810 Thoracic aortic ectasia: Secondary | ICD-10-CM | POA: Insufficient documentation

## 2023-10-03 DIAGNOSIS — I440 Atrioventricular block, first degree: Secondary | ICD-10-CM | POA: Insufficient documentation

## 2023-10-03 DIAGNOSIS — E7800 Pure hypercholesterolemia, unspecified: Secondary | ICD-10-CM | POA: Diagnosis not present

## 2023-10-03 DIAGNOSIS — M6530 Trigger finger, unspecified finger: Secondary | ICD-10-CM | POA: Insufficient documentation

## 2023-10-03 NOTE — Assessment & Plan Note (Signed)
 He does not take his flomax  all the time but overall he states he is urinating well.

## 2023-10-03 NOTE — Assessment & Plan Note (Signed)
 I want him to continue to exercise, eat healthy and lose weight.

## 2023-10-03 NOTE — Assessment & Plan Note (Signed)
 He will continue with exercise.  He can take tylnol as needed.

## 2023-10-03 NOTE — Assessment & Plan Note (Signed)
 Plan as above.

## 2023-10-03 NOTE — Progress Notes (Signed)
 Preventive Screening-Counseling & Management    Alex Castillo is a 78 year old Caucasian/White male who presents for his annual wellness exam. He is due for the following health maintenance studies: screening labs. This patient's past medical history Anxiety Disorder, Asthma, Benign Prostatic Hypertrophy, Diabetes Mellitus, Type II, Essential Hypertension, GERD, Hernia, Hiatal, and Hypercholesterolemia.   He had a yearly diabetic dilated eye exam done by Dr. Tobias Castillo at [facility] on 09/09/2023 and there was no evidence of diabetic retinopathy.  His last colonoscopy was in 11/2013 and this showed diverticulosis and a benign mucosal polyp. He has had previous polyps on past colonoscopies. He saw GI in 2020 with dysphagia. They performed an EGD on 03/2019 which shows a Schatski's ring where he was dilated. He has a small hiatal hernia. He had some recurrent dysphagia where they repeated an EGD on 05/02/2021 where he had a moderate muscular ring and a small hiatal hernia. They did dilate him once again. Today, he denies any problems with swallowing. He had an EGD done in 02/2013 which showed gastritis and suspected eosinophilic esophagitis. He remains on aciphex  once daily for his history of GERD and this does control his reflux. He does go to the gym 4 times a week with cardio and he does play tennis. He does have BPH and ED and is followed by urology where he states he did see them in 2024 and they did his prostate screening which was normal. He takes Flomax  as needed and denies any problems with urination. He states he does not take Flomax  every day. He does get yearly flu vaccines. He did have a zostavax vaccine in 2014. He had a Prevnar 13 vaccine in 05/2017. He completed a course of shingrix vaccine in 2021. He states he did do his pneumovax 23 vaccine in 01/2022.  He refused the RSV vaccine last year.   He had 3 COVID-19 vaccines including 1 booster. The patient denies any depression or memory loss. He  is on ASA 81mg  daily.   The patient is a 78 year old Caucasian/White male who returns for a follow-up visit for his T2 diabetes.  He was diagnosed with new onset diabetes on 03/2018 with a HgBA1c of 9.7%.  On his last visit, his diabetes was not fully controlled and we increased his Metformin  ER from 500mg  BID to 1000mg  BID.  Over the interim, he states he has not had any problems.  He remains on Metformin  ER 1000 mg po BID. He is exercising with using the elliptical at the gym 3 times a week and he plays tennis.  He specifically denies unexplained abdominal pain, nausea or vomiting, diarrhea, or documented hypoglycemia.  He is checking his FSBS 2-3 times per week and his sugars have been running 120-150.   He came in fasting today in anticipation of lab work.  His last HgBA1c was done 3 months ago and was 6.5%. There are no long term complications of diabetes with no diabetic retinopathy, neuropathy, nephropathy or cardiovascular disease.  He had a yearly diabetic dilated eye exam done by Dr. Tobias Castillo at [facility] on 09/09/2023 and there was no evidence of diabetic retinopathy.  Alex Castillo is a 78 yo male who returns today for routine followup on his cholesterol.   This past year, he has had noncompliance with his crestor .  I ran his calcium  score through the [facility] predictor from his BP and cholesterol from 08/2022.  He had a MESA score of 31.9% for his  10 year risk of a CHD event.  I told him on 12/2022 that he needed to be on his crestor  and if this does not control him, we would consider a PSK9 inhibitor.  Over the interim, he states he has restarted his crestor .  The patient was seen by his cardiologist last year where they were going to change him from crestor  to zocor  due to him having some myalgias.  This past year he was contacted by [facility] where they noted he was not filling his crestor .  He told them that he was having myalgias from his crestor  and he stopped the medication.   However, he had  restarted his crestor  where he denied any myalgias with dosing it 3 times per week.  However, he stopped taking as described above.  We asked him to restart his crestor  5mg  3 times per week but he states he did not do this as he went to see cardiology and wanted to see "the results of his artery scans".  His cardiologist ordered a CT cardiac calcium  scoring which was done on 12/03/2022 and showed a coronary calcium  score of 446. He also had a mildly dilated ascending aorta up to 40 mm.  Overall, he states he is doing well and is without any complaints or problems at this time. He specifically denies nausea, vomiting, diarrhea, myalgias, and fatigue. He remains on dietary management as well as fish oil, coenzyme Q10 and crestor  5mg  qhs. He is fasting in anticipation for labs today.  The patient also sees cardiology once a year due to a "rapid heart beat" with palpitions.  His last visit with cardiology was on 11/2022 where they are following him for 1st degree AVB.  This has been stable and he has not had anymore palpitations.  They are doing yearly EKG's on him.  The patient is a 78 year old Caucasian/White male who presents for a follow-up evaluation of hypertension. Last year, his BP was not controlled and we restarted HCTZ 12.5mg  daily.   The patient has been checking his blood pressure at home.  His systolic BP's have been running in the 130's. The patient's current medications include: Cozaar  100 mg daily, HCTZ 12.5mg  daily and amlodipine  10mg  daily.  The patient has been tolerating his medications well. The patient denies any headache, visual changes, dizziness, lightheadness, chest pain, shortness of breath, weakness/numbness, and edema.   The patient returns for followup of his anxiety.  His anxiety is currently controlled where he takes Zoloft 50mg   where he takes 1/2 tab po daily of sertraline (total dose 25mg  daily) and he takes clonazepam  1/2 of his pill once per day usually.  He is on Xanax  but he  uses this rarely and it is used for his hiatal hernia.  He states Dr. [employer] wrote him for this years ago. The symptoms of anxiety have been present for years. He reports no additional symptoms He denies suicidal ideation and homicidal ideation. This patient feels that she is able to care for himself. He currently lives with his wife. He has no significant prior history of mental health disorders.    He also has a history of neck and back pain due to degenerative disc disease.  He has DDD and has had surgery on L5 of his lumbar spine in the past.  He is being followed by us  for neck pain.  He states his neck and back pain is currently stable and hasn't changed in the past 15 years.  He  has seen neurosurgery in the past for his neck where he has had xrays but not a MRI.  He has had PT for his neck which did help this.  He has trigger finger of his right hand involving his 3rd and 4th finger which he states has not worsened over the last year.  He does play tennis.  He has noticed knots on his palm of his hands and he does get trigger finger at times of his 4th finger.  He did see Ortho in 07/2020 for possible Dupuytren's contracture but they felt he had possible early OA.  I referred him to the hand specialist but the patient never went.  Today, he states that his trigger finger is about the same as last year.  His fingers are not getting stuck but he is having increased numbness of his right hand.  The patient also has a history of moderate persistent asthma and allergies.  He was diagnosed with asthma about 15 years ago. He had a yellow jacket sting and went into anaphylactic shock. He has seen Dr. Kozlow in allergies for both his asthma and his allergies with his last visit 06/28/2021.  He is doing immunotherapy for hymenoptera allergy about every 6 weeks.  He is relatively not effected by his asthma and he has no baseline symptoms of asthma. Dr. Kozlow has him on advair  diskus but the patient uses this  rarely during the year.  He has an albuterol  inhaler but admits he never has to use it. See PMH for summary of pulmonary history and lab reports for status of any previous PFTs: Asthma and seasonal allergies. Comorbid conditions : none.. He has no baseline symptoms of asthma. The patient's condition is controlled by medications as summarized in the medication list on the face sheet. He has no modifiable risk factors. Specifically denied complaints: worsening exertional dyspnea, increased wheezing, pleuritic chest pain, hemoptysis, fever, and nocturnal wheezing.  His allergies usually affect him in the spring and fall and he can get wheezing with his asthma at that time.          Are there smokers in your home (other than you)? No  Risk Factors Current exercise habits: as above  Dietary issues discussed: none   Depression Screen (Note: if answer to either of the following is "Yes", a more complete depression screening is indicated)   Over the past two weeks, have you felt down, depressed or hopeless? No  Over the past two weeks, have you felt little interest or pleasure in doing things? No  Have you lost interest or pleasure in daily life? No  Do you often feel hopeless? No  Do you cry easily over simple problems? No  Activities of Daily Living In your present state of health, do you have any difficulty performing the following activities?:  Driving? No Managing money?  No Feeding yourself? No Getting from bed to chair? No Climbing a flight of stairs? No Preparing food and eating?: No Bathing or showering? No Getting dressed: No Getting to the toilet? No Using the toilet:No Moving around from place to place: No In the past year have you fallen or had a near fall?:No   Hearing Difficulties: Yes Do you often ask people to speak up or repeat themselves? Yes Do you experience ringing or noises in your ears? No Do you have difficulty understanding soft or whispered voices? Yes   Do  you feel that you have a problem with memory? No  Do you often  misplace items? No  Do you feel safe at home?  Yes  Cognitive Testing  Alert? Yes  Normal Appearance?Yes  Oriented to person? Yes  Place? Yes   Time? Yes  Recall of three objects?  No  Can perform simple calculations? Yes  Displays appropriate judgment?Yes  Can read the correct time from a watch face?Yes  Fall Risk Prevention  Any stairs in or around the home? Yes  If so, are there any without handrails? Yes  Home free of loose throw rugs in walkways, pet beds, electrical cords, etc? Yes  Adequate lighting in your home to reduce risk of falls? Yes  Use of a cane, walker or w/c? No    Time Up and Go  Was the test performed? Yes .  Length of time to ambulate 10 feet: 10 sec.   Gait steady and fast without use of assistive device    Advanced Directives have been discussed with the patient? Yes   List the Names of Other Physician/Practitioners you currently use: Patient Care Team: Mitcheal Amy, Reymundo Caulk, MD as PCP - General (Specialist) Loyde Rule, MD as PCP - Cardiology (Cardiology)    Past Medical History:  Diagnosis Date   Adenomatous polyps    Anxiety    Arthritis    Asthma    BPH (benign prostatic hyperplasia)    Cancer (HCC)    basal cell on nose   Complication of anesthesia    slow to wake up trouble voiding after a back surgery    Diabetes (HCC)    Diverticulosis    ED (erectile dysfunction)    Esophageal stricture    First degree AV block    GERD (gastroesophageal reflux disease)    Hiatal hernia    History of kidney stones    History of peptic ulcer    HTN (hypertension)    ETT negative 10/08   Hymenoptera allergy    Hypercholesterolemia    Internal hemorrhoids    Palpitations    Staph infection    in right elbow   T2DM (type 2 diabetes mellitus) (HCC)    Unspecified gastritis and gastroduodenitis without mention of hemorrhage 02/19/2013    Past Surgical History:  Procedure  Laterality Date   bilateral inguinal hernia repair     COLONOSCOPY W/ BIOPSIES AND POLYPECTOMY  09/15/2008   adonomatous polyps, diverticulosis, internal hemorrhoids   CYSTOSCOPY/RETROGRADE/URETEROSCOPY/STONE EXTRACTION WITH BASKET Left 12/08/2012   Procedure: CYSTOSCOPY, LEFT RETROGRADE, LEFT URETEROSCOPY/STONE EXTRACTION WITH BASKET;  Surgeon: Osborn Blaze, MD;  Location: [facility];  Service: Urology;  Laterality: Left;   ESOPHAGOGASTRODUODENOSCOPY  02/19/2013   INGUINAL HERNIA REPAIR Left 07/25/2016   Procedure: LAPAROSCOPIC REPAIR RECURRENT LEFT INGUINAL HERNIA WITH MESH POSSIBLE OPEN;  Surgeon: Boyce Byes, MD;  Location: [facility];  Service: General;  Laterality: Left;   INSERTION OF MESH Left 07/25/2016   Procedure: INSERTION OF MESH;  Surgeon: Boyce Byes, MD;  Location: [facility];  Service: General;  Laterality: Left;   lower back surgery with lumbar vertebral repair     nasl septoplasty     right foot surgery     UPPER GASTROINTESTINAL ENDOSCOPY  02/07/2010   w/dil, tourtuous esophagus, hiatal hernia      Current Medications  Current Outpatient Medications  Medication Sig Dispense Refill   albuterol  (PROAIR  HFA) 108 (90 Base) MCG/ACT inhaler Inhale two puffs every 4-6 hours if needed for cough or wheeze. 1 each 1   amLODipine  (NORVASC ) 10 MG tablet TAKE 1 TABLET BY MOUTH EVERY  DAY 90 tablet 2   Ascorbic Acid (VITAMIN C PO) Take by mouth daily.     aspirin  81 MG tablet Take 81 mg by mouth 2 (two) times daily.     clonazePAM  (KLONOPIN ) 0.5 MG tablet Take 1 tablet (0.5 mg total) by mouth daily as needed. 30 tablet 2   Coenzyme Q10 (COQ-10 PO) Take 100 mg by mouth. Take 2 daily     EPINEPHrine  0.3 mg/0.3 mL IJ SOAJ injection Use as directed for life-threatening allergic reaction. 2 each 3   GARLIC PO Take by mouth daily.     glucose blood (ONETOUCH VERIO) test strip USE TO CHECK BLOOD SUGAR TWICE DAILY 100 strip 1   hydrochlorothiazide  (HYDRODIURIL ) 12.5 MG tablet Take 1 tablet (12.5 mg  total) by mouth daily. 90 tablet 2   Lancets (ONETOUCH DELICA PLUS LANCET33G) MISC CHECK FASTING BLOOD SUGAR TWICE DAILY 100 each 12   losartan  (COZAAR ) 100 MG tablet TAKE 1 TABLET(100 MG) BY MOUTH DAILY 30 tablet 11   metFORMIN  (GLUCOPHAGE -XR) 500 MG 24 hr tablet Take 2 tablets (1,000 mg total) by mouth 2 (two) times daily with a meal. 360 tablet 1   Multiple Vitamin (MULTIVITAMIN PO) Take by mouth daily.     Multiple Vitamins-Minerals (ZINC PO) Take by mouth daily.     Omega-3 Fatty Acids (FISH OIL) 1200 MG CAPS Take 1,200 mg by mouth daily.     RABEprazole  (ACIPHEX ) 20 MG tablet TAKE 1 TABLET BY MOUTH EVERY DAY 90 tablet 0   rosuvastatin  (CRESTOR ) 5 MG tablet TAKE 1 TABLET BY MOUTH 3 TIMES A WEEK 90 tablet 1   sertraline (ZOLOFT) 50 MG tablet TAKE 1 TABLET BY MOUTH DAILY 90 tablet 0   sildenafil (REVATIO) 20 MG tablet SMARTSIG:2-5 Tablet(s) By Mouth     TRUE METRIX BLOOD GLUCOSE TEST test strip daily.     vitamin E 400 UNIT capsule Take 400 Units by mouth daily.     No current facility-administered medications for this visit.    Allergies Bee venom and Hydromorphone hcl   Social History Social History   Tobacco Use   Smoking status: Never   Smokeless tobacco: Never  Substance Use Topics   Alcohol  use: No     Review of Systems Review of Systems  Constitutional:  Negative for chills, fever, malaise/fatigue and weight loss.  Eyes:  Negative for blurred vision and double vision.  Respiratory:  Negative for cough, hemoptysis, shortness of breath and wheezing.   Cardiovascular:  Negative for chest pain, palpitations and leg swelling.  Gastrointestinal:  Negative for abdominal pain, blood in stool, constipation, diarrhea, heartburn, melena, nausea and vomiting.  Genitourinary:  Negative for frequency and hematuria.  Musculoskeletal:  Positive for neck pain. Negative for myalgias.  Skin:  Negative for itching and rash.  Neurological:  Negative for dizziness, weakness and headaches.   Endo/Heme/Allergies:  Negative for polydipsia.  Psychiatric/Behavioral:  Negative for depression and memory loss. The patient does not have insomnia.      Physical Exam:      Body mass index is 30.85 kg/m. BP 134/72   Pulse 65   Temp 98.2 F (36.8 C)   Resp 16   Wt 215 lb (97.5 kg)   SpO2 96%   BMI 30.85 kg/m   Physical Exam Constitutional:      Appearance: Normal appearance. He is not ill-appearing.  HENT:     Head: Normocephalic and atraumatic.     Right Ear: Tympanic membrane, ear canal and external ear  normal.     Left Ear: Tympanic membrane, ear canal and external ear normal.     Nose: Nose normal. No congestion or rhinorrhea.     Mouth/Throat:     Mouth: Mucous membranes are dry.     Pharynx: Oropharynx is clear. No oropharyngeal exudate or posterior oropharyngeal erythema.  Eyes:     General: No scleral icterus.    Conjunctiva/sclera: Conjunctivae normal.     Pupils: Pupils are equal, round, and reactive to light.  Neck:     Vascular: No carotid bruit.  Cardiovascular:     Rate and Rhythm: Normal rate and regular rhythm.     Pulses: Normal pulses.     Heart sounds: No murmur heard.    No friction rub. No gallop.  Pulmonary:     Effort: Pulmonary effort is normal. No respiratory distress.     Breath sounds: No wheezing, rhonchi or rales.  Abdominal:     General: Abdomen is flat. Bowel sounds are normal. There is no distension.     Palpations: Abdomen is soft.     Tenderness: There is no abdominal tenderness.  Musculoskeletal:     Cervical back: Neck supple. No tenderness.     Right lower leg: No edema.     Left lower leg: No edema.  Lymphadenopathy:     Cervical: No cervical adenopathy.  Skin:    General: Skin is warm and dry.     Findings: No rash.  Neurological:     General: No focal deficit present.     Mental Status: He is alert and oriented to person, place, and time.  Psychiatric:        Mood and Affect: Mood normal.        Behavior:  Behavior normal.      Assessment:      Diabetic polyneuropathy associated with type 2 diabetes mellitus (HCC) - Plan: CBC with Differential/Platelet, CMP14 + Anion Gap, Hemoglobin A1c, Lipid panel, Microalbumin / creatinine urine ratio, TSH  Essential hypertension  Hypercholesterolemia - Plan: CBC with Differential/Platelet, CMP14 + Anion Gap, Hemoglobin A1c, Lipid panel, Microalbumin / creatinine urine ratio, TSH  GAD (generalized anxiety disorder)  DDD (degenerative disc disease), cervical  Gastroesophageal reflux disease, unspecified whether esophagitis present  Benign prostatic hyperplasia without lower urinary tract symptoms - Plan: PSA  Mild persistent asthma without complication  Seasonal allergies  Mild dilation of ascending aorta (HCC) - Plan: Ambulatory referral to Cardiology  BMI 30.0-30.9,adult  Class 1 obesity due to excess calories with serious comorbidity and body mass index (BMI) of 30.0 to 30.9 in adult - Plan: CBC with Differential/Platelet, CMP14 + Anion Gap, Hemoglobin A1c, Lipid panel, Microalbumin / creatinine urine ratio, TSH  AV BLOCK, 1ST DEGREE - Plan: Ambulatory referral to Cardiology  Coronary artery calcification - Plan: Ambulatory referral to Cardiology  Hymenoptera allergy  Trigger finger of right hand, unspecified finger - Plan: Ambulatory referral to Hand Surgery  Encounter for screening colonoscopy - Plan: Ambulatory referral to Gastroenterology    Plan:     During the course of the visit the patient was educated and counseled about appropriate screening and preventive services including:   Pneumococcal vaccine  Influenza vaccine Colorectal cancer screening Advanced directives: he is working with his lawyer now regarding this.  Diet review for nutrition referral? Yes ____  Not Indicated _X___   Patient Instructions (the written plan) was given to the patient.  AV BLOCK, 1ST DEGREE We are making an appointment for him to  followup  with Dr. Nishan.  Essential hypertension His BP is currently controlled on his current medications.  We will continue to monitor.  Mild dilation of ascending aorta (HCC) This was noted on his coronary CT angiogram in 11/2022.  He will need additional imaging and will see Dr. Stann Earnest in cardiology in next coming months.  Seasonal allergies He takes claritin and nasacort as needed.  Hypercholesterolemia He has coronary  Coronary artery calcification His coronary CT angiogram was noted from last year.  He remains on a statin and ASA.  Asthma He will only take his albuterol  if he has a cold.  His asthma is currently controlled.  GERD He will continue on his current PPI.  Diabetic polyneuropathy associated with type 2 diabetes mellitus (HCC) His diabetes has been controlled.  His diabetic foot exam was normal.  We will obtain some diabetic labs today.  DDD (degenerative disc disease), cervical He will continue with exercise.  He can take tylnol as needed.  Benign prostatic hyperplasia without lower urinary tract symptoms He does not take his flomax  all the time but overall he states he is urinating well.  BMI 30.0-30.9,adult I want him to continue to exercise, eat healthy and lose weight.  Class 1 obesity due to excess calories with serious comorbidity and body mass index (BMI) of 30.0 to 30.9 in adult Plan as above.  GAD (generalized anxiety disorder) His anxiety is controlled on zoloft and clonazepam  as needed.  Hymenoptera allergy Plan as per Dr. Kozlow.  Trigger finger of right hand He has trigger finger and history of duputreyn's contracture.  We will refer him to Dr. Huntley Mai in hand surgery.   Prevention Health maintenance discussed.  We will refer him to GI for colonoscopy.  We will obtain some yearly labs.    Medicare Attestation I have personally reviewed: The patient's medical and social history Their use of alcohol , tobacco or illicit drugs Their  current medications and supplements The patient's functional ability including ADLs,fall risks, home safety risks, cognitive, and hearing and visual impairment Diet and physical activities Evidence for depression or mood disorders  The patient's weight, height, and BMI have been recorded in the chart.  I have made referrals, counseling, and provided education to the patient based on review of the above and I have provided the patient with a written personalized care plan for preventive services.     Wayne Haines, MD   10/03/2023

## 2023-10-03 NOTE — Assessment & Plan Note (Signed)
 He has trigger finger and history of duputreyn's contracture.  We will refer him to Dr. Huntley Mai in hand surgery.

## 2023-10-03 NOTE — Assessment & Plan Note (Signed)
 Plan as per Dr. Kozlow.

## 2023-10-03 NOTE — Assessment & Plan Note (Signed)
 His diabetes has been controlled.  His diabetic foot exam was normal.  We will obtain some diabetic labs today.

## 2023-10-03 NOTE — Assessment & Plan Note (Signed)
 He has coronary

## 2023-10-03 NOTE — Assessment & Plan Note (Signed)
 His anxiety is controlled on zoloft and clonazepam  as needed.

## 2023-10-03 NOTE — Assessment & Plan Note (Signed)
 He will continue on his current PPI.

## 2023-10-03 NOTE — Assessment & Plan Note (Signed)
 He will only take his albuterol  if he has a cold.  His asthma is currently controlled.

## 2023-10-03 NOTE — Assessment & Plan Note (Signed)
 He takes claritin and nasacort as needed.

## 2023-10-03 NOTE — Assessment & Plan Note (Signed)
 His coronary CT angiogram was noted from last year.  He remains on a statin and ASA.

## 2023-10-03 NOTE — Assessment & Plan Note (Signed)
 We are making an appointment for him to followup with Dr. Stann Earnest.

## 2023-10-03 NOTE — Assessment & Plan Note (Signed)
 This was noted on his coronary CT angiogram in 11/2022.  He will need additional imaging and will see Dr. Stann Earnest in cardiology in next coming months.

## 2023-10-03 NOTE — Assessment & Plan Note (Signed)
His BP is currently controlled on his current medications.  We will continue to monitor.

## 2023-10-04 LAB — CBC WITH DIFFERENTIAL/PLATELET
Basophils Absolute: 0.1 10*3/uL (ref 0.0–0.2)
Basos: 1 %
EOS (ABSOLUTE): 0.2 10*3/uL (ref 0.0–0.4)
Eos: 2 %
Hematocrit: 45.3 % (ref 37.5–51.0)
Hemoglobin: 14.9 g/dL (ref 13.0–17.7)
Immature Grans (Abs): 0 10*3/uL (ref 0.0–0.1)
Immature Granulocytes: 0 %
Lymphocytes Absolute: 1.7 10*3/uL (ref 0.7–3.1)
Lymphs: 28 %
MCH: 30.7 pg (ref 26.6–33.0)
MCHC: 32.9 g/dL (ref 31.5–35.7)
MCV: 93 fL (ref 79–97)
Monocytes Absolute: 0.5 10*3/uL (ref 0.1–0.9)
Monocytes: 9 %
Neutrophils Absolute: 3.7 10*3/uL (ref 1.4–7.0)
Neutrophils: 60 %
Platelets: 200 10*3/uL (ref 150–450)
RBC: 4.86 x10E6/uL (ref 4.14–5.80)
RDW: 12.9 % (ref 11.6–15.4)
WBC: 6.2 10*3/uL (ref 3.4–10.8)

## 2023-10-04 LAB — CMP14 + ANION GAP
ALT: 19 IU/L (ref 0–44)
AST: 19 IU/L (ref 0–40)
Albumin: 4.4 g/dL (ref 3.8–4.8)
Alkaline Phosphatase: 69 IU/L (ref 44–121)
Anion Gap: 13 mmol/L (ref 10.0–18.0)
BUN/Creatinine Ratio: 18 (ref 10–24)
BUN: 17 mg/dL (ref 8–27)
Bilirubin Total: 0.6 mg/dL (ref 0.0–1.2)
CO2: 26 mmol/L (ref 20–29)
Calcium: 9.1 mg/dL (ref 8.6–10.2)
Chloride: 100 mmol/L (ref 96–106)
Creatinine, Ser: 0.92 mg/dL (ref 0.76–1.27)
Globulin, Total: 2.5 g/dL (ref 1.5–4.5)
Glucose: 119 mg/dL — ABNORMAL HIGH (ref 70–99)
Potassium: 4.1 mmol/L (ref 3.5–5.2)
Sodium: 139 mmol/L (ref 134–144)
Total Protein: 6.9 g/dL (ref 6.0–8.5)
eGFR: 87 mL/min/{1.73_m2} (ref >59)

## 2023-10-04 LAB — TSH: TSH: 1.62 u[IU]/mL (ref 0.450–4.500)

## 2023-10-04 LAB — LIPID PANEL
Chol/HDL Ratio: 4.2 ratio (ref 0.0–5.0)
Cholesterol, Total: 148 mg/dL (ref 100–199)
HDL: 35 mg/dL — ABNORMAL LOW (ref >39)
LDL Chol Calc (NIH): 99 mg/dL (ref 0–99)
Triglycerides: 71 mg/dL (ref 0–149)
VLDL Cholesterol Cal: 14 mg/dL (ref 5–40)

## 2023-10-04 LAB — MICROALBUMIN / CREATININE URINE RATIO
Creatinine, Urine: 27 mg/dL
Microalb/Creat Ratio: <11 mg/g{creat} (ref 0–29)
Microalbumin, Urine: <3 ug/mL

## 2023-10-04 LAB — HEMOGLOBIN A1C
Est. average glucose Bld gHb Est-mCnc: 137 mg/dL
Hgb A1c MFr Bld: 6.4 % — ABNORMAL HIGH (ref 4.8–5.6)

## 2023-10-04 LAB — PSA: Prostate Specific Ag, Serum: 21.3 ng/mL — ABNORMAL HIGH (ref 0.0–4.0)

## 2023-10-08 ENCOUNTER — Ambulatory Visit: Payer: Self-pay

## 2023-10-08 NOTE — Progress Notes (Signed)
 His diabetes is controlled. His cholesterol needs to be a bit better controlled. Increase his dose of crestor  from 5mg  to 10mg  night and call this in. His PSA is elevated. He needs to go in and see his urologist. Make him an appointment  Patient aware and will make appt to Uro

## 2023-10-21 ENCOUNTER — Other Ambulatory Visit: Payer: Self-pay | Admitting: Cardiovascular Disease

## 2023-10-21 DIAGNOSIS — N5201 Erectile dysfunction due to arterial insufficiency: Secondary | ICD-10-CM | POA: Diagnosis not present

## 2023-10-21 DIAGNOSIS — N200 Calculus of kidney: Secondary | ICD-10-CM | POA: Diagnosis not present

## 2023-10-21 DIAGNOSIS — R3911 Hesitancy of micturition: Secondary | ICD-10-CM | POA: Diagnosis not present

## 2023-10-21 DIAGNOSIS — R972 Elevated prostate specific antigen [PSA]: Secondary | ICD-10-CM | POA: Diagnosis not present

## 2023-11-02 ENCOUNTER — Other Ambulatory Visit: Payer: Self-pay | Admitting: Internal Medicine

## 2023-11-05 ENCOUNTER — Other Ambulatory Visit: Payer: Self-pay | Admitting: Internal Medicine

## 2023-11-05 ENCOUNTER — Ambulatory Visit (INDEPENDENT_AMBULATORY_CARE_PROVIDER_SITE_OTHER)

## 2023-11-05 DIAGNOSIS — Z91038 Other insect allergy status: Secondary | ICD-10-CM | POA: Diagnosis not present

## 2023-11-05 MED ORDER — CLONAZEPAM 0.5 MG PO TABS: 0.5000 mg | ORAL_TABLET | Freq: Every day | ORAL | 2 refills | Status: DC | PRN

## 2023-11-07 DIAGNOSIS — L218 Other seborrheic dermatitis: Secondary | ICD-10-CM | POA: Diagnosis not present

## 2023-11-07 DIAGNOSIS — L309 Dermatitis, unspecified: Secondary | ICD-10-CM | POA: Diagnosis not present

## 2023-11-12 DIAGNOSIS — G5603 Carpal tunnel syndrome, bilateral upper limbs: Secondary | ICD-10-CM | POA: Diagnosis not present

## 2023-11-12 DIAGNOSIS — R200 Anesthesia of skin: Secondary | ICD-10-CM | POA: Diagnosis not present

## 2023-11-28 ENCOUNTER — Telehealth: Payer: Self-pay | Admitting: Internal Medicine

## 2023-11-28 DIAGNOSIS — R1319 Other dysphagia: Secondary | ICD-10-CM

## 2023-11-28 DIAGNOSIS — Z860100 Personal history of colon polyps, unspecified: Secondary | ICD-10-CM

## 2023-11-28 DIAGNOSIS — K222 Esophageal obstruction: Secondary | ICD-10-CM

## 2023-11-28 NOTE — Telephone Encounter (Signed)
 I tried to contact the patient, no answer and voicemail not set up to leave a message.   If he contacts the office he needs a EGD-Dil/ colonoscopy DIRECT per Dr Avram   Dx Endoscopy: Schatzki Ring Esophageal dysmotility   Dx Colonoscopy: Hx of colon polyps   Will need Pre-Visit

## 2023-11-28 NOTE — Telephone Encounter (Signed)
 Patient with a history of recurrent dysphagia he has a Schatzki ring and possible dysmotility and periodically has esophageal dilation.  He is having recurrent symptoms and we decided to proceed with another EGD and likely esophageal dilation.  He also had a colonoscopy 10 years ago primary care has recommended repeat he does have a remote history of an adenomatous polyp.SABRA  He is 78 but vigorous so okay to proceed with a  colonoscopy as well.   Encounter Diagnoses  Name Primary?   Esophageal dysphagia Yes   Schatzki ring of distal esophagus    History of colonic polyps    Please arrange for EGD and colonoscopy direct in the [facility]

## 2023-12-02 DIAGNOSIS — N5201 Erectile dysfunction due to arterial insufficiency: Secondary | ICD-10-CM | POA: Diagnosis not present

## 2023-12-02 DIAGNOSIS — R3911 Hesitancy of micturition: Secondary | ICD-10-CM | POA: Diagnosis not present

## 2023-12-02 DIAGNOSIS — R972 Elevated prostate specific antigen [PSA]: Secondary | ICD-10-CM | POA: Diagnosis not present

## 2023-12-02 DIAGNOSIS — N200 Calculus of kidney: Secondary | ICD-10-CM | POA: Diagnosis not present

## 2023-12-09 ENCOUNTER — Encounter: Payer: Self-pay | Admitting: Internal Medicine

## 2023-12-09 NOTE — Telephone Encounter (Signed)
 I have left Alex Castillo a detailed voicemail message on his cell #  to call us  back and set up a EGD & Colonoscopy along with a pre-visit per Dr Avram.

## 2023-12-09 NOTE — Telephone Encounter (Signed)
 Pre-visit paperwork will be mailed out to [facility].

## 2023-12-09 NOTE — Telephone Encounter (Signed)
 Patient was scheduled for EGD and colonoscopy for September the 18 th and 7:00 am and Pre-visit on September the 4 th at 8:00 AM. Please advise.

## 2023-12-31 ENCOUNTER — Ambulatory Visit (INDEPENDENT_AMBULATORY_CARE_PROVIDER_SITE_OTHER): Admitting: *Deleted

## 2023-12-31 DIAGNOSIS — Z91038 Other insect allergy status: Secondary | ICD-10-CM

## 2024-01-02 ENCOUNTER — Ambulatory Visit: Admitting: Internal Medicine

## 2024-01-02 ENCOUNTER — Encounter: Payer: Self-pay | Admitting: Internal Medicine

## 2024-01-02 VITALS — BP 136/72 | HR 76 | Temp 97.9°F | Resp 16 | Ht 70.0 in | Wt 216.8 lb

## 2024-01-02 DIAGNOSIS — E7800 Pure hypercholesterolemia, unspecified: Secondary | ICD-10-CM

## 2024-01-02 DIAGNOSIS — E1142 Type 2 diabetes mellitus with diabetic polyneuropathy: Secondary | ICD-10-CM | POA: Diagnosis not present

## 2024-01-02 DIAGNOSIS — I10 Essential (primary) hypertension: Secondary | ICD-10-CM | POA: Diagnosis not present

## 2024-01-02 NOTE — Assessment & Plan Note (Signed)
His BP is currently controlled.  We will continue his current medications.

## 2024-01-02 NOTE — Progress Notes (Signed)
 Office Visit  Subjective   Patient ID: Alex Castillo   DOB: 10/12/1945   Age: 78 y.o.   MRN: [id number]   Chief Complaint Chief Complaint  Patient presents with   Follow-up    3 month follow up     History of Present Illness The patient is a 78 year old Caucasian/White male who returns for a follow-up visit for his T2 diabetes.  He was diagnosed with new onset diabetes on 03/2018 with a HgBA1c of 9.7%.  This past year, his diabetes was not fully controlled and we increased his Metformin  ER from 500mg  BID to 1000mg  BID.  Over the interim, he states he has not had any problems.  He remains on Metformin  ER 1000 mg po BID. He is exercising with using the elliptical at the gym 3 times a week and he plays tennis.  He specifically denies unexplained abdominal pain, nausea or vomiting, diarrhea, or documented hypoglycemia.  He is checking his FSBS 2-3 times per week and his sugars have been running 120-140.   He came in fasting today in anticipation of lab work.  His last HgBA1c was done 3 months ago and was 6.4%. There are no long term complications of diabetes with no diabetic retinopathy, neuropathy, nephropathy or cardiovascular disease.  He had a yearly diabetic dilated eye exam done by Dr. Maudie at [facility] on 09/09/2023 and there was no evidence of diabetic retinopathy.   The patient is a 78 year old Caucasian/White male who presents for a follow-up evaluation of hypertension. Last year, his BP was not controlled and we restarted HCTZ 12.5mg  daily.  Since his last visit, he has not had any problems.  The patient has been checking his blood pressure at home.  His systolic BP's have been running in the 120-130's. The patient's current medications include: Cozaar  100 mg daily, HCTZ 12.5mg  daily and amlodipine  10mg  daily.  The patient has been tolerating his medications well. The patient denies any headache, visual changes, dizziness, lightheadness, chest pain, shortness of breath,  weakness/numbness, and edema.  Alex Castillo is a 78 yo male who returns today for routine followup on his cholesterol.   On his last visit, his cholesterol was not controlled and we increased his crestor  from 5mg  to 10mg  daily.  This past year, he has had noncompliance with his crestor .  I ran his calcium  score through the [facility] predictor from his BP and cholesterol from 08/2022.  He had a MESA score of 31.9% for his 10 year risk of a CHD event.  I told him on 12/2022 that he needed to be on his crestor  and if this does not control him, we would consider a PSK9 inhibitor.  Over the interim, he states he has restarted his crestor .  The patient was seen by his cardiologist last year where they were going to change him from crestor  to zocor  due to him having some myalgias.  This past year he was contacted by [facility] upstream where they noted he was not filling his crestor .  He told them that he was having myalgias from his crestor  and he stopped the medication.   However, he had restarted his crestor  where he denied any myalgias with dosing it 3 times per week.  However, he stopped taking as described above.  We asked him to restart his crestor  5mg  3 times per week but he states he did not do this as he went to see cardiology and wanted to see the results  of his artery scans.  His cardiologist ordered a CT cardiac calcium  scoring which was done on 12/03/2022 and showed a coronary calcium  score of 446. He also had a mildly dilated ascending aorta up to 40 mm.  Overall, he states he is doing well and is without any complaints or problems at this time. He specifically denies nausea, vomiting, diarrhea, myalgias, and fatigue. He remains on dietary management as well as fish oil, coenzyme Q10 and crestor  10mg  qhs. He is fasting in anticipation for labs today.        Past Medical History Past Medical History:  Diagnosis Date   Adenomatous polyps    Anxiety    Arthritis    Asthma    BPH (benign prostatic  hyperplasia)    Cancer (HCC)    basal cell on nose   Complication of anesthesia    slow to wake up trouble voiding after a back surgery    Diabetes (HCC)    Diverticulosis    ED (erectile dysfunction)    Esophageal stricture    First degree AV block    GERD (gastroesophageal reflux disease)    Hiatal hernia    History of kidney stones    History of peptic ulcer    HTN (hypertension)    ETT negative 10/08   Hymenoptera allergy    Hypercholesterolemia    Internal hemorrhoids    Palpitations    Staph infection    in right elbow   T2DM (type 2 diabetes mellitus) (HCC)    Unspecified gastritis and gastroduodenitis without mention of hemorrhage 02/19/2013     Allergies Allergies  Allergen Reactions   Bee Venom Anaphylaxis   Hydromorphone Hcl Nausea And Vomiting     Medications  Current Outpatient Medications:    albuterol  (PROAIR  HFA) 108 (90 Base) MCG/ACT inhaler, Inhale two puffs every 4-6 hours if needed for cough or wheeze., Disp: 1 each, Rfl: 1   amLODipine  (NORVASC ) 10 MG tablet, TAKE 1 TABLET BY MOUTH EVERY DAY, Disp: 90 tablet, Rfl: 0   Ascorbic Acid (VITAMIN C PO), Take by mouth daily., Disp: , Rfl:    aspirin  81 MG tablet, Take 81 mg by mouth 2 (two) times daily., Disp: , Rfl:    clonazePAM  (KLONOPIN ) 0.5 MG tablet, Take 1 tablet (0.5 mg total) by mouth daily as needed for anxiety., Disp: 30 tablet, Rfl: 2   Coenzyme Q10 (COQ-10 PO), Take 100 mg by mouth. Take 2 daily, Disp: , Rfl:    EPINEPHrine  0.3 mg/0.3 mL IJ SOAJ injection, Use as directed for life-threatening allergic reaction., Disp: 2 each, Rfl: 3   GARLIC PO, Take by mouth daily., Disp: , Rfl:    glucose blood (ONETOUCH VERIO) test strip, USE TO CHECK BLOOD SUGAR TWICE DAILY, Disp: 100 strip, Rfl: 1   hydrochlorothiazide  (HYDRODIURIL ) 12.5 MG tablet, Take 1 tablet (12.5 mg total) by mouth daily., Disp: 90 tablet, Rfl: 2   Lancets (ONETOUCH DELICA PLUS LANCET33G) MISC, CHECK FASTING BLOOD SUGAR TWICE DAILY,  Disp: 100 each, Rfl: 12   losartan  (COZAAR ) 100 MG tablet, TAKE 1 TABLET(100 MG) BY MOUTH DAILY, Disp: 30 tablet, Rfl: 11   metFORMIN  (GLUCOPHAGE -XR) 500 MG 24 hr tablet, Take 2 tablets (1,000 mg total) by mouth 2 (two) times daily with a meal., Disp: 360 tablet, Rfl: 1   Multiple Vitamin (MULTIVITAMIN PO), Take by mouth daily., Disp: , Rfl:    Multiple Vitamins-Minerals (ZINC PO), Take by mouth daily., Disp: , Rfl:    Omega-3 Fatty Acids (  FISH OIL) 1200 MG CAPS, Take 1,200 mg by mouth daily., Disp: , Rfl:    RABEprazole  (ACIPHEX ) 20 MG tablet, TAKE 1 TABLET BY MOUTH EVERY DAY, Disp: 90 tablet, Rfl: 0   rosuvastatin  (CRESTOR ) 5 MG tablet, TAKE 1 TABLET BY MOUTH 3 TIMES A WEEK, Disp: 90 tablet, Rfl: 1   sertraline (ZOLOFT) 50 MG tablet, TAKE 1 TABLET BY MOUTH DAILY, Disp: 90 tablet, Rfl: 0   sildenafil (REVATIO) 20 MG tablet, SMARTSIG:2-5 Tablet(s) By Mouth, Disp: , Rfl:    TRUE METRIX BLOOD GLUCOSE TEST test strip, daily., Disp: , Rfl:    vitamin E 400 UNIT capsule, Take 400 Units by mouth daily., Disp: , Rfl:    Review of Systems Review of Systems  Constitutional:  Negative for chills, fever and malaise/fatigue.  Eyes:  Negative for blurred vision and double vision.  Respiratory:  Negative for cough and shortness of breath.   Cardiovascular:  Negative for chest pain and palpitations.  Gastrointestinal:  Negative for abdominal pain, constipation, diarrhea, nausea and vomiting.  Genitourinary:  Negative for frequency.  Musculoskeletal:  Negative for myalgias.  Skin:  Negative for itching and rash.  Neurological:  Negative for dizziness, weakness and headaches.  Endo/Heme/Allergies:  Negative for polydipsia.       Objective:    Vitals BP 136/72   Pulse 76   Temp 97.9 F (36.6 C) (Temporal)   Resp 16   Ht 5' 10 (1.778 m)   Wt 216 lb 12.8 oz (98.3 kg)   SpO2 96%   BMI 31.11 kg/m    Physical Examination Physical Exam Constitutional:      Appearance: Normal appearance. He is  not ill-appearing.  Cardiovascular:     Rate and Rhythm: Normal rate and regular rhythm.     Pulses: Normal pulses.     Heart sounds: No murmur heard.    No friction rub. No gallop.  Pulmonary:     Effort: Pulmonary effort is normal. No respiratory distress.     Breath sounds: No wheezing, rhonchi or rales.  Abdominal:     General: Abdomen is flat. Bowel sounds are normal. There is no distension.     Palpations: Abdomen is soft.     Tenderness: There is no abdominal tenderness.  Musculoskeletal:     Right lower leg: No edema.     Left lower leg: No edema.  Skin:    General: Skin is warm and dry.     Findings: No rash.  Neurological:     General: No focal deficit present.     Mental Status: He is alert and oriented to person, place, and time.  Psychiatric:        Mood and Affect: Mood normal.        Behavior: Behavior normal.        Assessment & Plan:   Essential hypertension His BP is currently controlled.  We will continue his current medications.  Diabetic polyneuropathy associated with type 2 diabetes mellitus (HCC) We will recheck his A1c today.  Continue on metformin  and continue with healthy eating and regular exercise.  Hypercholesterolemia We will check a FLP on him today to see if the new dose of crestor  is controlling him.    Return in about 3 months (around 04/03/2024).   Selinda Fleeta Finger, MD

## 2024-01-02 NOTE — Assessment & Plan Note (Signed)
 We will check a FLP on him today to see if the new dose of crestor  is controlling him.

## 2024-01-02 NOTE — Assessment & Plan Note (Signed)
 We will recheck his A1c today.  Continue on metformin  and continue with healthy eating and regular exercise.

## 2024-01-03 LAB — CMP14 + ANION GAP
ALT: 17 IU/L (ref 0–44)
AST: 19 IU/L (ref 0–40)
Albumin: 4.3 g/dL (ref 3.8–4.8)
Alkaline Phosphatase: 65 IU/L (ref 44–121)
Anion Gap: 13 mmol/L (ref 10.0–18.0)
BUN/Creatinine Ratio: 20 (ref 10–24)
BUN: 17 mg/dL (ref 8–27)
Bilirubin Total: 0.6 mg/dL (ref 0.0–1.2)
CO2: 25 mmol/L (ref 20–29)
Calcium: 9.1 mg/dL (ref 8.6–10.2)
Chloride: 100 mmol/L (ref 96–106)
Creatinine, Ser: 0.86 mg/dL (ref 0.76–1.27)
Globulin, Total: 2.7 g/dL (ref 1.5–4.5)
Glucose: 134 mg/dL — ABNORMAL HIGH (ref 70–99)
Potassium: 4.1 mmol/L (ref 3.5–5.2)
Sodium: 138 mmol/L (ref 134–144)
Total Protein: 7 g/dL (ref 6.0–8.5)
eGFR: 90 mL/min/1.73 (ref >59)

## 2024-01-03 LAB — LIPID PANEL
Chol/HDL Ratio: 4 ratio (ref 0.0–5.0)
Cholesterol, Total: 147 mg/dL (ref 100–199)
HDL: 37 mg/dL — ABNORMAL LOW (ref >39)
LDL Chol Calc (NIH): 93 mg/dL (ref 0–99)
Triglycerides: 90 mg/dL (ref 0–149)
VLDL Cholesterol Cal: 17 mg/dL (ref 5–40)

## 2024-01-03 LAB — HEMOGLOBIN A1C
Est. average glucose Bld gHb Est-mCnc: 148 mg/dL
Hgb A1c MFr Bld: 6.8 % — ABNORMAL HIGH (ref 4.8–5.6)

## 2024-01-09 DIAGNOSIS — E119 Type 2 diabetes mellitus without complications: Secondary | ICD-10-CM | POA: Diagnosis not present

## 2024-01-09 DIAGNOSIS — Z87442 Personal history of urinary calculi: Secondary | ICD-10-CM | POA: Diagnosis not present

## 2024-01-09 DIAGNOSIS — E782 Mixed hyperlipidemia: Secondary | ICD-10-CM | POA: Diagnosis not present

## 2024-01-09 DIAGNOSIS — I10 Essential (primary) hypertension: Secondary | ICD-10-CM | POA: Diagnosis not present

## 2024-01-16 ENCOUNTER — Ambulatory Visit (AMBULATORY_SURGERY_CENTER)

## 2024-01-16 VITALS — Ht 70.0 in | Wt 216.0 lb

## 2024-01-16 DIAGNOSIS — Z860100 Personal history of colon polyps, unspecified: Secondary | ICD-10-CM

## 2024-01-16 DIAGNOSIS — K222 Esophageal obstruction: Secondary | ICD-10-CM

## 2024-01-16 MED ORDER — NA SULFATE-K SULFATE-MG SULF 17.5-3.13-1.6 GM/177ML PO SOLN: 1.0000 | Freq: Once | ORAL | 0 refills | Status: AC

## 2024-01-16 NOTE — Progress Notes (Signed)

## 2024-01-17 ENCOUNTER — Encounter: Payer: Self-pay | Admitting: Internal Medicine

## 2024-01-17 ENCOUNTER — Other Ambulatory Visit: Payer: Self-pay | Admitting: Internal Medicine

## 2024-01-19 ENCOUNTER — Other Ambulatory Visit: Payer: Self-pay | Admitting: Cardiovascular Disease

## 2024-01-21 ENCOUNTER — Ambulatory Visit: Payer: Self-pay

## 2024-01-21 NOTE — Progress Notes (Signed)
 Patient called.  Left message for patient to call back.  I have called and left the patient a voicemail to return our phone call. The patient needs to be informed I would like him to take his crestor  10mg  every other night as his LDL needs to be a little lower .

## 2024-01-29 NOTE — Progress Notes (Unsigned)
 [facility] History and Physical   Primary Care Physician:  Fleeta Valeria Mayo, MD   Reason for Procedure:    Encounter Diagnoses  Name Primary?   History of colonic polyps Yes   Esophageal dysphagia      Plan:    Colonoscopy     HPI: Alex Castillo is a 78 y.o. male with a history of recurrent dysphagia that responds to [facility] dilation of the esophagus.  He has esophageal dysmotility he has also had a Schatzki ring in the past.  Last EGD in 2022 with a Schatzki ring, 54 French Maloney dilator did not disrupt that so it was disrupted with biopsy forceps.  He was seen recently while his wife was in the endoscopy center and was complaining of recurrent dysphagia.  He also has a history of a colon adenoma in 2010, no polyps in 2015 and his due for a surveillance colonoscopy exam.  Patient requested this also.  Sigmoid diverticulosis was seen at 2015 exam.   Past Medical History:  Diagnosis Date   Adenomatous polyps    Anxiety    Arthritis    Asthma    BPH (benign prostatic hyperplasia)    Cancer (HCC)    basal cell on nose   Complication of anesthesia    slow to wake up trouble voiding after a back surgery    Diabetes (HCC)    Diverticulosis    ED (erectile dysfunction)    Esophageal stricture    First degree AV block    GERD (gastroesophageal reflux disease)    Hiatal hernia    History of kidney stones    History of peptic ulcer    HTN (hypertension)    ETT negative 10/08   Hymenoptera allergy    Hypercholesterolemia    Internal hemorrhoids    Palpitations    Staph infection    in right elbow   T2DM (type 2 diabetes mellitus) (HCC)    Unspecified gastritis and gastroduodenitis without mention of hemorrhage 02/19/2013    Past Surgical History:  Procedure Laterality Date   bilateral inguinal hernia repair     COLONOSCOPY W/ BIOPSIES AND POLYPECTOMY  09/15/2008   adonomatous polyps, diverticulosis, internal hemorrhoids    CYSTOSCOPY/RETROGRADE/URETEROSCOPY/STONE EXTRACTION WITH BASKET Left 12/08/2012   Procedure: CYSTOSCOPY, LEFT RETROGRADE, LEFT URETEROSCOPY/STONE EXTRACTION WITH BASKET;  Surgeon: Ricardo Likens, MD;  Location: [facility];  Service: Urology;  Laterality: Left;   ESOPHAGOGASTRODUODENOSCOPY  02/19/2013   INGUINAL HERNIA REPAIR Left 07/25/2016   Procedure: LAPAROSCOPIC REPAIR RECURRENT LEFT INGUINAL HERNIA WITH MESH POSSIBLE OPEN;  Surgeon: Elon Pacini, MD;  Location: [facility];  Service: General;  Laterality: Left;   INSERTION OF MESH Left 07/25/2016   Procedure: INSERTION OF MESH;  Surgeon: Elon Pacini, MD;  Location: [facility];  Service: General;  Laterality: Left;   lower back surgery with lumbar vertebral repair     nasl septoplasty     right foot surgery     UPPER GASTROINTESTINAL ENDOSCOPY  02/07/2010   w/dil, tourtuous esophagus, hiatal hernia     Current Outpatient Medications  Medication Sig Dispense Refill   albuterol  (PROAIR  HFA) 108 (90 Base) MCG/ACT inhaler Inhale two puffs every 4-6 hours if needed for cough or wheeze. 1 each 1   amLODipine  (NORVASC ) 10 MG tablet TAKE 1 TABLET BY MOUTH EVERY DAY 90 tablet 0   Apple Cider Vinegar 500 MG TABS 2 tablets.     Ascorbic Acid (VITAMIN C PO) Take by mouth daily.  aspirin  81 MG tablet Take 81 mg by mouth 2 (two) times daily.     B-Complex TABS 1 tablet Orally once daily     clonazePAM  (KLONOPIN ) 0.5 MG tablet Take 1 tablet (0.5 mg total) by mouth daily as needed for anxiety. 30 tablet 2   Coenzyme Q10 (COQ-10 PO) Take 100 mg by mouth. Take 2 daily     EPINEPHrine  0.3 mg/0.3 mL IJ SOAJ injection Use as directed for life-threatening allergic reaction. 2 each 3   GARLIC PO Take by mouth daily.     glucose blood (ONETOUCH VERIO) test strip USE TO CHECK BLOOD SUGAR TWICE DAILY 100 strip 1   hydrochlorothiazide  (HYDRODIURIL ) 12.5 MG tablet Take 1 tablet (12.5 mg total) by mouth daily. 90 tablet 2   Lancets (ONETOUCH DELICA PLUS LANCET33G) MISC  CHECK FASTING BLOOD SUGAR TWICE DAILY 100 each 12   losartan  (COZAAR ) 100 MG tablet TAKE 1 TABLET(100 MG) BY MOUTH DAILY 90 tablet 0   Magnesium 400 MG TABS 1 tablet Orally once daily     metFORMIN  (GLUCOPHAGE -XR) 500 MG 24 hr tablet Take 2 tablets (1,000 mg total) by mouth 2 (two) times daily with a meal. (Patient taking differently: Take 1,000 mg by mouth daily.) 360 tablet 1   Multiple Vitamins-Minerals (ZINC PO) Take by mouth daily.     Omega-3 Fatty Acids (FISH OIL) 1200 MG CAPS Take 1,200 mg by mouth daily.     RABEprazole  (ACIPHEX ) 20 MG tablet TAKE 1 TABLET BY MOUTH EVERY DAY 90 tablet 0   rosuvastatin  (CRESTOR ) 5 MG tablet TAKE 1 TABLET BY MOUTH 3 TIMES A WEEK (Patient taking differently: Take 5 mg by mouth 3 (three) times a week.) 90 tablet 1   sertraline (ZOLOFT) 50 MG tablet TAKE 1 TABLET BY MOUTH DAILY 90 tablet 0   sildenafil (REVATIO) 20 MG tablet SMARTSIG:2-5 Tablet(s) By Mouth     tamsulosin  (FLOMAX ) 0.4 MG CAPS capsule take 1 capsule by mouth every day as needed     TRUE METRIX BLOOD GLUCOSE TEST test strip daily.     vitamin E 400 UNIT capsule Take 400 Units by mouth daily.     No current facility-administered medications for this visit.    Allergies as of 01/30/2024 - Review Complete 01/16/2024  Allergen Reaction Noted   Bee venom Anaphylaxis 09/24/2012   Hydromorphone hcl Nausea And Vomiting     Family History  Problem Relation Age of Onset   Hypertension Mother    Lung cancer Father    Heart disease Father    Arthritis Father    Emphysema Father    Asthma Maternal Grandfather    Liver disease Daughter        Primary Sclerosisng cholangitis   Colon cancer Neg Hx    Throat cancer Neg Hx    Kidney disease Neg Hx    Stomach cancer Neg Hx    Rectal cancer Neg Hx    Esophageal cancer Neg Hx    Colon polyps Neg Hx     Social History   Socioeconomic History   Marital status: Married    Spouse name: Not on file   Number of children: 2   Years of education:  Not on file   Highest education level: Not on file  Occupational History   Occupation: Retired  Tobacco Use   Smoking status: Never   Smokeless tobacco: Never  Vaping Use   Vaping status: Never Used  Substance and Sexual Activity   Alcohol  use: No  Drug use: No   Sexual activity: Yes  Other Topics Concern   Not on file  Social History Narrative   Daily Caffeine use sodas and tea   Social Drivers of Health   Financial Resource Strain: Not on file  Food Insecurity: Low Risk  (11/12/2023)   Received from [facility]   Hunger Vital Sign    Within the past 12 months, you worried that your food would run out before you got money to buy more: Never true    Within the past 12 months, the food you bought just didn't last and you didn't have money to get more. : Never true  Transportation Needs: No Transportation Needs (11/12/2023)   Received from [facility]    In the past 12 months, has lack of reliable transportation kept you from medical appointments, meetings, work or from getting things needed for daily living? : No  Physical Activity: Not on file  Stress: Not on file  Social Connections: Not on file  Intimate Partner Violence: Not on file    Review of Systems: Positive for *** All other review of systems negative except as mentioned in the HPI.  Physical Exam: Vital signs There were no vitals taken for this visit.  General:   Alert,  Well-developed, well-nourished, pleasant and cooperative in NAD Lungs:  Clear throughout to auscultation.   Heart:  Regular rate and rhythm; no murmurs, clicks, rubs,  or gallops. Abdomen:  Soft, nontender and nondistended. Normal bowel sounds.   Neuro/Psych:  Alert and cooperative. Normal mood and affect. A and O x 3   @Hymen Arnett  CHARLENA Commander, MD, [facility] [phone] (pager) 01/29/2024 8:37 PM@

## 2024-01-30 ENCOUNTER — Ambulatory Visit: Admitting: Internal Medicine

## 2024-01-30 ENCOUNTER — Encounter: Payer: Self-pay | Admitting: Internal Medicine

## 2024-01-30 VITALS — BP 139/73 | HR 60 | Temp 98.0°F | Resp 17 | Ht 70.0 in | Wt 216.0 lb

## 2024-01-30 DIAGNOSIS — K5730 Diverticulosis of large intestine without perforation or abscess without bleeding: Secondary | ICD-10-CM

## 2024-01-30 DIAGNOSIS — F419 Anxiety disorder, unspecified: Secondary | ICD-10-CM | POA: Diagnosis not present

## 2024-01-30 DIAGNOSIS — E119 Type 2 diabetes mellitus without complications: Secondary | ICD-10-CM | POA: Diagnosis not present

## 2024-01-30 DIAGNOSIS — R1319 Other dysphagia: Secondary | ICD-10-CM

## 2024-01-30 DIAGNOSIS — Z860100 Personal history of colon polyps, unspecified: Secondary | ICD-10-CM

## 2024-01-30 DIAGNOSIS — D120 Benign neoplasm of cecum: Secondary | ICD-10-CM | POA: Diagnosis not present

## 2024-01-30 DIAGNOSIS — K449 Diaphragmatic hernia without obstruction or gangrene: Secondary | ICD-10-CM

## 2024-01-30 DIAGNOSIS — Z860101 Personal history of adenomatous and serrated colon polyps: Secondary | ICD-10-CM | POA: Diagnosis not present

## 2024-01-30 DIAGNOSIS — D123 Benign neoplasm of transverse colon: Secondary | ICD-10-CM

## 2024-01-30 DIAGNOSIS — R1310 Dysphagia, unspecified: Secondary | ICD-10-CM | POA: Diagnosis not present

## 2024-01-30 DIAGNOSIS — I10 Essential (primary) hypertension: Secondary | ICD-10-CM | POA: Diagnosis not present

## 2024-01-30 DIAGNOSIS — K648 Other hemorrhoids: Secondary | ICD-10-CM

## 2024-01-30 DIAGNOSIS — K222 Esophageal obstruction: Secondary | ICD-10-CM

## 2024-01-30 DIAGNOSIS — E7800 Pure hypercholesterolemia, unspecified: Secondary | ICD-10-CM | POA: Diagnosis not present

## 2024-01-30 DIAGNOSIS — Z1211 Encounter for screening for malignant neoplasm of colon: Secondary | ICD-10-CM | POA: Diagnosis not present

## 2024-01-30 DIAGNOSIS — K635 Polyp of colon: Secondary | ICD-10-CM | POA: Diagnosis not present

## 2024-01-30 MED ORDER — SODIUM CHLORIDE 0.9 % IV SOLN: 500.0000 mL | INTRAVENOUS | Status: DC

## 2024-01-30 NOTE — Op Note (Addendum)
 [facility] Patient Name: Alex Castillo Procedure Date: 01/30/2024 7:32 AM MRN: [id number] Endoscopist: Lupita FORBES Commander , MD, [id number] Age: 78 Referring MD:  Date of Birth: [birth year] Gender: Male Account #: [id number] Procedure:                Upper GI endoscopy Indications:              Dysphagia Medicines:                Monitored Anesthesia Care Procedure:                Pre-Anesthesia Assessment:                           - Prior to the procedure, a History and Physical                            was performed, and patient medications and                            allergies were reviewed. The patient's tolerance of                            previous anesthesia was also reviewed. The risks                            and benefits of the procedure and the sedation                            options and risks were discussed with the patient.                            All questions were answered, and informed consent                            was obtained. Prior Anticoagulants: The patient has                            taken no anticoagulant or antiplatelet agents. ASA                            Grade Assessment: II - A patient with mild systemic                            disease. After reviewing the risks and benefits,                            the patient was deemed in satisfactory condition to                            undergo the procedure.                           After obtaining informed consent, the endoscope was  passed under direct vision. Throughout the                            procedure, the patient's blood pressure, pulse, and                            oxygen saturations were monitored continuously. The                            GIF [id number] #[id number] was introduced through the                            mouth, and advanced to the second part of duodenum.                            The upper GI endoscopy was accomplished without                             difficulty. The patient tolerated the procedure                            well. Scope In: Scope Out: Findings:                 A low-grade of narrowing Schatzki ring was found at                            the gastroesophageal junction. The scope was                            withdrawn. Dilation was performed with a Maloney                            dilator with mild resistance at 54 Fr. The dilation                            site was examined following endoscope reinsertion                            and showed no change. Estimated blood loss: none.                            Biopsy forceps used to disrupt the ring after                            dilation had no effect.                           A 2 cm hiatal hernia was present.                           The gastroesophageal flap valve was visualized                            endoscopically and  classified as Hill Grade III                            (minimal fold, loose to endoscope, hiatal hernia                            likely).                           The exam was otherwise without abnormality.                           The cardia and gastric fundus were otherwise normal                            on retroflexion. Complications:            No immediate complications. Estimated Blood Loss:     Estimated blood loss was minimal. Impression:               - Low-grade of narrowing Schatzki ring. Dilated.                           - 2 cm hiatal hernia.                           - Gastroesophageal flap valve classified as Hill                            Grade III (minimal fold, loose to endoscope, hiatal                            hernia likely).                           - The examination was otherwise normal.                           - No specimens collected. Recommendation:           - Patient has a contact number available for                            emergencies. The signs and symptoms of  potential                            delayed complications were discussed with the                            patient. Return to normal activities tomorrow.                            Written discharge instructions were provided to the                            patient.                           -  Clear liquids x 1 hour then soft foods rest of                            day. Start prior diet tomorrow.                           - Continue present medications.                           - See the other procedure note for documentation of                            additional recommendations. Lupita FORBES Commander, MD 01/30/2024 8:51:31 AM This report has been signed electronically. Addendum Number: 1   Addendum Date: 02/12/2024 4:28:46 PM      Biopsy forceps were used to disrupt the ring with good effect (not no       effect) Lupita FORBES Commander, MD 02/12/2024 4:29:12 PM This report has been signed electronically.

## 2024-01-30 NOTE — Progress Notes (Signed)
 Report to PACU, RN, vss, BBS= Clear.

## 2024-01-30 NOTE — Progress Notes (Signed)
 Pt's states no medical or surgical changes since previsit or office visit.

## 2024-01-30 NOTE — Patient Instructions (Addendum)
 The esophagus was dilated and I worked on the [employer] like I did previously as well (used biopsy forceps to disrupt it).  I hope this helps you again.  Colonoscopy revealed 2 small polyps that look benign and they were removed and sent to pathology.  There is diverticulosis and hemorrhoids were also seen.  I will let you know the pathology results.  You do not need a routine repeat colonoscopy given these findings and your age.  I appreciate the opportunity to care for you. Alex CHARLENA Commander, MD, FACG   YOU HAD AN ENDOSCOPIC PROCEDURE TODAY AT THE [facility]:   Refer to the procedure report that was given to you for any specific questions about what was found during the examination.  If the procedure report does not answer your questions, please call your gastroenterologist to clarify.  If you requested that your care partner not be given the details of your procedure findings, then the procedure report has been included in a sealed envelope for you to review at your convenience later.  YOU SHOULD EXPECT: Some feelings of bloating in the abdomen. Passage of more gas than usual.  Walking can help get rid of the air that was put into your GI tract during the procedure and reduce the bloating. If you had a lower endoscopy (such as a colonoscopy or flexible sigmoidoscopy) you may notice spotting of blood in your stool or on the toilet paper. If you underwent a bowel prep for your procedure, you may not have a normal bowel movement for a few days.  Please Note:  You might notice some irritation and congestion in your nose or some drainage.  This is from the oxygen used during your procedure.  There is no need for concern and it should clear up in a day or so.  SYMPTOMS TO REPORT IMMEDIATELY:  Following lower endoscopy (colonoscopy or flexible sigmoidoscopy):  Excessive amounts of blood in the stool  Significant tenderness or worsening of abdominal pains  Swelling of the abdomen that  is new, acute  Fever of 100F or higher  Following upper endoscopy (EGD)  Vomiting of blood or coffee ground material  New chest pain or pain under the shoulder blades  Painful or persistently difficult swallowing  New shortness of breath  Fever of 100F or higher  Black, tarry-looking stools  For urgent or emergent issues, a gastroenterologist can be reached at any hour by calling [phone]. Do not use MyChart messaging for urgent concerns.    DIET:  We do recommend a dilation diet today (see handout), but then you may proceed to your regular diet tomorrow.  Drink plenty of fluids but you should avoid alcoholic beverages for 24 hours.  ACTIVITY:  You should plan to take it easy for the rest of today and you should NOT DRIVE or use heavy machinery until tomorrow (because of the sedation medicines used during the test).    FOLLOW UP: Our staff will call the number listed on your records the next business day following your procedure.  We will call around 7:15- 8:00 am to check on you and address any questions or concerns that you may have regarding the information given to you following your procedure. If we do not reach you, we will leave a message.     If any biopsies were taken you will be contacted by phone or by letter within the next 1-3 weeks.  Please call us  at [phone] if you have not  heard about the biopsies in 3 weeks.    SIGNATURES/CONFIDENTIALITY: You and/or your care partner have signed paperwork which will be entered into your electronic medical record.  These signatures attest to the fact that that the information above on your After Visit Summary has been reviewed and is understood.  Full responsibility of the confidentiality of this discharge information lies with you and/or your care-partner.

## 2024-01-30 NOTE — Op Note (Signed)
 [facility] Patient Name: Alex Castillo Procedure Date: 01/30/2024 7:30 AM MRN: [id number] Endoscopist: Lupita FORBES Commander , MD, [id number] Age: 78 Referring MD:  Date of Birth: January 17, 1946 Gender: Male Account #: [id number] Procedure:                Colonoscopy Indications:              High risk colon cancer surveillance: Personal                            history of colonic polyps, Last colonoscopy: 2015                            2010 had subcentimeter adenoma, no polyps 2015 Medicines:                Monitored Anesthesia Care Procedure:                Pre-Anesthesia Assessment:                           - Prior to the procedure, a History and Physical                            was performed, and patient medications and                            allergies were reviewed. The patient's tolerance of                            previous anesthesia was also reviewed. The risks                            and benefits of the procedure and the sedation                            options and risks were discussed with the patient.                            All questions were answered, and informed consent                            was obtained. Prior Anticoagulants: The patient has                            taken no anticoagulant or antiplatelet agents. ASA                            Grade Assessment: II - A patient with mild systemic                            disease. After reviewing the risks and benefits,                            the patient was deemed in satisfactory condition to  undergo the procedure.                           After obtaining informed consent, the colonoscope                            was passed under direct vision. Throughout the                            procedure, the patient's blood pressure, pulse, and                            oxygen saturations were monitored continuously. The                            Olympus Scope  [id number] was introduced through the                            anus and advanced to the the cecum, identified by                            appendiceal orifice and ileocecal valve. The                            colonoscopy was performed without difficulty. The                            patient tolerated the procedure well. The quality                            of the bowel preparation was good. The ileocecal                            valve, appendiceal orifice, and rectum were                            photographed. The bowel preparation used was SUPREP                            via split dose instruction. Scope In: 8:23:07 AM Scope Out: 8:37:51 AM Scope Withdrawal Time: 0 hours 12 minutes 39 seconds  Total Procedure Duration: 0 hours 14 minutes 44 seconds  Findings:                 The perianal and digital rectal examinations were                            normal.                           Two sessile polyps were found in the transverse                            colon and cecum. The polyps were 2 to 5 mm in size.  These polyps were removed with a cold snare.                            Resection and retrieval were complete. Verification                            of patient identification for the specimen was                            done. Estimated blood loss was minimal.                           Multiple diverticula were found in the sigmoid                            colon, descending colon and transverse colon.                           Internal hemorrhoids were found.                           The exam was otherwise without abnormality on                            direct and retroflexion views. Complications:            No immediate complications. Estimated Blood Loss:     Estimated blood loss was minimal. Impression:               - Two 2 to 5 mm polyps in the transverse colon and                            in the cecum, removed with a cold  snare. Resected                            and retrieved.                           - Diverticulosis in the sigmoid colon, in the                            descending colon and in the transverse colon.                           - Internal hemorrhoids.                           - The examination was otherwise normal on direct                            and retroflexion views.                           - Personal history of colonic polyp. Recommendation:           - Patient has a contact number available for  emergencies. The signs and symptoms of potential                            delayed complications were discussed with the                            patient. Return to normal activities tomorrow.                            Written discharge instructions were provided to the                            patient.                           - Clear liquids x 1 hour then soft foods rest of                            day. Start prior diet tomorrow.                           - Continue present medications.                           - Await pathology results.                           - No repeat colonoscopy due to age. Lupita FORBES Commander, MD 01/30/2024 8:55:49 AM This report has been signed electronically.

## 2024-01-30 NOTE — Progress Notes (Signed)
 Called to room to assist during endoscopic procedure.  Patient ID and intended procedure confirmed with present staff. Received instructions for my participation in the procedure from the performing physician.

## 2024-01-31 ENCOUNTER — Telehealth: Payer: Self-pay

## 2024-01-31 NOTE — Telephone Encounter (Signed)
  Follow up Call-     01/30/2024    7:14 AM  Call back number  Post procedure Call Back phone  # [phone]  Permission to leave phone message Yes     Patient questions:  Do you have a fever, pain , or abdominal swelling? No. Pain Score  0 *  Have you tolerated food without any problems? Yes.    Have you been able to return to your normal activities? Yes.    Do you have any questions about your discharge instructions: Diet   No. Medications  No. Follow up visit  No.  Do you have questions or concerns about your Care? No.  Actions: * If pain score is 4 or above: No action needed, pain <4.

## 2024-02-03 LAB — SURGICAL PATHOLOGY

## 2024-02-03 MED ORDER — ROSUVASTATIN CALCIUM 5 MG PO TABS: 10.0000 mg | ORAL_TABLET | ORAL | 1 refills | Status: AC

## 2024-02-03 NOTE — Progress Notes (Signed)
 Patient called.  Patient aware.  I have called and informed the patient that Dr. Fleeta Finger stated  I would like him to take his crestor  10mg  every other night as his LDL needs to be a little lower .  Pt aware.

## 2024-02-10 ENCOUNTER — Ambulatory Visit: Payer: Self-pay | Admitting: Internal Medicine

## 2024-02-13 ENCOUNTER — Ambulatory Visit: Admitting: Cardiovascular Disease

## 2024-02-20 ENCOUNTER — Other Ambulatory Visit: Payer: Self-pay | Admitting: Internal Medicine

## 2024-02-25 ENCOUNTER — Ambulatory Visit (INDEPENDENT_AMBULATORY_CARE_PROVIDER_SITE_OTHER): Admitting: *Deleted

## 2024-02-25 DIAGNOSIS — Z91038 Other insect allergy status: Secondary | ICD-10-CM | POA: Diagnosis not present

## 2024-02-26 ENCOUNTER — Other Ambulatory Visit: Payer: Self-pay | Admitting: Internal Medicine

## 2024-02-27 ENCOUNTER — Other Ambulatory Visit: Payer: Self-pay | Admitting: Internal Medicine

## 2024-02-27 MED ORDER — CLONAZEPAM 0.5 MG PO TABS: 0.5000 mg | ORAL_TABLET | Freq: Every day | ORAL | 2 refills | Status: DC | PRN

## 2024-03-19 ENCOUNTER — Other Ambulatory Visit: Payer: Self-pay | Admitting: Cardiovascular Disease

## 2024-03-20 ENCOUNTER — Other Ambulatory Visit: Payer: Self-pay | Admitting: Cardiovascular Disease

## 2024-03-25 NOTE — Progress Notes (Signed)
 Date:  04/03/2024   ID:  Alex Castillo, DOB Oct 24, 1945, MRN [id number]  PCP:  Fleeta Valeria Mayo, MD  Cardiologist:  Dr. Delford    History of Present Illness: Alex Castillo is a 78 y.o. male who presents for f/u of HTN, and palpitations.    Activity limited by chronic back issues but still tries to play tennis   Hx of 1st degree AV block.   Years ago he had 3 episodes of rapid HR and none since.  He is allergic to bees and had episode of arrest with initial sting.  Now carries epi pen.  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 Weight up eating a bit too much popcorn and his wife's [employer] Rx  Had myalgias with pravastatin and similar with crestor  Last telephone visit recommended trying Zocor  M/W/F LDL is 111 08/28/22 Primary has spoiken with him and taking crestor  3x/week  Seems to have been on crestor  10 mg every other day Last LDL 01/02/24 was 93   Discussed Nexlizet and PSK9's will get calcium  score to see if necessary He has been taking crestor  very inconsistently  Playing tennis 2x/week no cardiac symptoms  Past Medical History:  Diagnosis Date   Adenomatous polyps    Anxiety    Arthritis    Asthma    BPH (benign prostatic hyperplasia)    Cancer (HCC)    basal cell on nose   Complication of anesthesia    slow to wake up trouble voiding after a back surgery    Diabetes (HCC)    Diverticulosis    ED (erectile dysfunction)    Esophageal stricture    First degree AV block    GERD (gastroesophageal reflux disease)    Hiatal hernia    History of kidney stones    History of peptic ulcer    HTN (hypertension)    ETT negative 10/08   Hymenoptera allergy    Hypercholesterolemia    Internal hemorrhoids    Palpitations    Staph infection    in right  elbow   T2DM (type 2 diabetes mellitus) (HCC)    Unspecified gastritis and gastroduodenitis without mention of hemorrhage 02/19/2013    Past Surgical History:  Procedure Laterality Date   bilateral inguinal hernia repair     COLONOSCOPY W/ BIOPSIES AND POLYPECTOMY  09/15/2008   adonomatous polyps, diverticulosis, internal hemorrhoids   CYSTOSCOPY/RETROGRADE/URETEROSCOPY/STONE EXTRACTION WITH BASKET Left 12/08/2012   Procedure: CYSTOSCOPY, LEFT RETROGRADE, LEFT URETEROSCOPY/STONE EXTRACTION WITH BASKET;  Surgeon: Ricardo Likens, MD;  Location: [facility];  Service: Urology;  Laterality: Left;   ESOPHAGOGASTRODUODENOSCOPY  02/19/2013   INGUINAL HERNIA REPAIR Left 07/25/2016   Procedure: LAPAROSCOPIC REPAIR RECURRENT LEFT INGUINAL HERNIA WITH MESH POSSIBLE OPEN;  Surgeon: Elon Pacini, MD;  Location: [facility];  Service: General;  Laterality: Left;   INSERTION OF MESH Left 07/25/2016   Procedure: INSERTION OF MESH;  Surgeon: Elon Pacini, MD;  Location: [facility];  Service: General;  Laterality: Left;   lower back surgery with lumbar vertebral repair     nasl septoplasty     right foot surgery     UPPER GASTROINTESTINAL ENDOSCOPY  02/07/2010   w/dil, tourtuous esophagus, hiatal hernia     Current Outpatient Medications  Medication Sig Dispense Refill   albuterol  (PROAIR  HFA) 108 (90 Base) MCG/ACT inhaler Inhale two puffs every 4-6 hours if needed for cough or wheeze. 1 each 1   amLODipine  (NORVASC ) 10 MG tablet TAKE 1 TABLET BY MOUTH EVERY DAY 30 tablet 0   Apple Cider Vinegar 500 MG TABS 2 tablets.     Ascorbic Acid (VITAMIN C PO) Take by mouth daily.     aspirin  81 MG tablet Take 81 mg by mouth 2 (two) times daily.     B-Complex TABS 1 tablet Orally once daily     clonazePAM  (KLONOPIN ) 0.5 MG tablet Take 1 tablet (0.5 mg total) by mouth daily as needed for anxiety. 30 tablet 2   Coenzyme Q10 (COQ-10 PO) Take 100 mg by mouth. Take 2 daily     EPINEPHrine  0.3 mg/0.3 mL IJ SOAJ injection Use as  directed for life-threatening allergic reaction. 2 each 3   GARLIC PO Take by mouth daily.     glucose blood (ONETOUCH VERIO) test strip USE TO CHECK BLOOD SUGAR TWICE DAILY 100 strip 1   hydrochlorothiazide  (HYDRODIURIL ) 12.5 MG tablet Take 1 tablet (12.5 mg total) by mouth daily. 90 tablet 2   Lancets (ONETOUCH DELICA PLUS LANCET33G) MISC CHECK FASTING BLOOD SUGAR TWICE DAILY 100 each 12   losartan  (COZAAR ) 100 MG tablet TAKE 1 TABLET(100 MG) BY MOUTH DAILY 90 tablet 0   Magnesium 400 MG TABS 1 tablet Orally once daily     metFORMIN  (GLUCOPHAGE -XR) 500 MG 24 hr tablet TAKE 1 TABLET BY MOUTH TWICE DAILY 180 tablet 0   Multiple Vitamins-Minerals (ZINC PO) Take by mouth daily.     Omega-3 Fatty Acids (FISH OIL) 1200 MG CAPS Take 1,200 mg by mouth daily.     RABEprazole  (ACIPHEX ) 20 MG tablet TAKE 1 TABLET BY MOUTH EVERY DAY 90 tablet 0   rosuvastatin  (CRESTOR ) 5 MG tablet Take 2 tablets (10 mg total) by mouth every other day. 90 tablet 1   sertraline (ZOLOFT) 50 MG tablet TAKE 1 TABLET BY MOUTH DAILY 90 tablet 0   sildenafil (REVATIO) 20 MG tablet SMARTSIG:2-5 Tablet(s) By Mouth     tamsulosin  (FLOMAX ) 0.4 MG CAPS capsule take 1 capsule by mouth every day as needed     TRUE METRIX BLOOD GLUCOSE TEST test strip daily.     vitamin E 400 UNIT capsule Take 400 Units by mouth daily.     No current facility-administered medications for this visit.    Allergies:   Bee venom and Hydromorphone hcl    Social History:  The patient  reports that he has never smoked. He has never used smokeless tobacco. He reports that he does not drink alcohol  and does not use drugs.   Family History:  The patient's family history includes Arthritis in his father; Asthma in his maternal grandfather; Emphysema in his father; Heart disease in his father; Hypertension in his mother; Liver disease in his daughter; Lung cancer in his father.    ROS:  General:no colds or fevers, no weight changes Skin:no rashes or  ulcers HEENT:no blurred vision, no congestion CV:see HPI PUL:see HPI GI:no diarrhea constipation or melena, no indigestion GU:no hematuria, no dysuria MS:no joint pain, no claudication Neuro:no syncope, no lightheadedness Endo:no diabetes, no thyroid  disease  Wt Readings from Last 3 Encounters:  04/03/24 216 lb (98 kg)  01/30/24 216 lb (98 kg)  01/16/24 216 lb (98 kg)     PHYSICAL EXAM: VS:  BP 126/76   Pulse 67  Ht 5' 10 (1.778 m)   Wt 216 lb (98 kg)   SpO2 96%   BMI 30.99 kg/m  , BMI Body mass index is 30.99 kg/m.  Affect appropriate Healthy:  appears stated age HEENT: normal Neck supple with no adenopathy JVP normal no bruits no thyromegaly Lungs clear with no wheezing and good diaphragmatic motion Heart:  S1/S2 no murmur, no rub, gallop or click PMI normal Abdomen: benighn, BS positve, no tenderness, no AAA no bruit.  No HSM or HJR Distal pulses intact with no bruits No edema Neuro non-focal Skin warm and dry No muscular weakness    EKG:  04/03/2024 SR normal PR 233 msec    Recent Labs: 10/03/2023: Hemoglobin 14.9; Platelets 200; TSH 1.620 01/02/2024: ALT 17; BUN 17; Creatinine, Ser 0.86; Potassium 4.1; Sodium 138    Lipid Panel    Component Value Date/Time   CHOL 147 01/02/2024 0900   TRIG 90 01/02/2024 0900   HDL 37 (L) 01/02/2024 0900   CHOLHDL 4.0 01/02/2024 0900   CHOLHDL 5 09/28/2009 0826   VLDL 18.6 09/28/2009 0826   LDLCALC 93 01/02/2024 0900      Other studies Reviewed: Additional studies/ records that were reviewed today include: previous OV notes.   ASSESSMENT AND PLAN:  1.  HTN Well controlled.  Continue current medications and low sodium Dash type diet.    2.  Palpitations.  No further episodes observe   3.  1st degree AV block - monitor. Yearly ECG   PR 230 msec on today's ECG  4. DM:  Newly diagnosed in November 2020  on glucophage  A1c improved to 6.4  5. HLD: not at goal not taking crestor  as ordered  Calcium  score  12/04/22 elevated at 446 , 62 nd percentile LDL 93 Discussed target LDL of < 70 update calcium  score If very high will need to go to lipid clinic For now he indicates being willing to take crestor  as ordered  6. GI:  f/u Gessner esophageal dysmotility and Schatzki ring with prior dilatation most recent 01/30/24 associated with hiatal hernia  Calcium  score   Disposition:   FU: in  a year    Signed, Maude Emmer, MD  04/03/2024 8:54 AM    [facility] [address] [place], [place], [place]  [address]/ [address] [place], [place] Phone: [phone]; Fax: [phone]  [phone]

## 2024-04-02 ENCOUNTER — Ambulatory Visit: Admitting: Internal Medicine

## 2024-04-03 ENCOUNTER — Encounter: Payer: Self-pay | Admitting: Cardiovascular Disease

## 2024-04-03 ENCOUNTER — Ambulatory Visit: Payer: Self-pay | Attending: Cardiovascular Disease | Admitting: Cardiovascular Disease

## 2024-04-03 VITALS — BP 126/76 | HR 67 | Ht 70.0 in | Wt 216.0 lb

## 2024-04-03 DIAGNOSIS — I10 Essential (primary) hypertension: Secondary | ICD-10-CM | POA: Diagnosis not present

## 2024-04-03 DIAGNOSIS — I2510 Atherosclerotic heart disease of native coronary artery without angina pectoris: Secondary | ICD-10-CM

## 2024-04-03 DIAGNOSIS — I440 Atrioventricular block, first degree: Secondary | ICD-10-CM | POA: Diagnosis not present

## 2024-04-03 NOTE — Patient Instructions (Addendum)
 Medication Instructions:   Your physician recommends that you continue on your current medications as directed. Please refer to the Current Medication list given to you today.   *If you need a refill on your cardiac medications before your next appointment, please call your pharmacy*   Lab Work:  NONE ORDERED  TODAY    If you have labs (blood work) drawn today and your tests are completely normal, you will receive your results only by: MyChart Message (if you have MyChart) OR A paper copy in the mail If you have any lab test that is abnormal or we need to change your treatment, we will call you to review the results.  Testing/Procedures: Non-Cardiac CT scanning, (CAT scanning), is a noninvasive, special x-ray that produces cross-sectional images of the body using x-rays and a computer. CT scans help physicians diagnose and treat medical conditions. For some CT exams, a contrast material is used to enhance visibility in the area of the body being studied. CT scans provide greater clarity and reveal more details than regular x-ray exams.    Follow-Up: At [facility], you and your health needs are our priority.  As part of our continuing mission to provide you with exceptional heart care, our providers are all part of one team.  This team includes your primary Cardiologist (physician) and Advanced Practice Providers or APPs (Physician Assistants and Nurse Practitioners) who all work together to provide you with the care you need, when you need it.  Your next appointment:   1 year(s)   Provider:    Maude Emmer, MD    We recommend signing up for the patient portal called MyChart.  Sign up information is provided on this After Visit Summary.  MyChart is used to connect with patients for Virtual Visits (Telemedicine).  Patients are able to view lab/test results, encounter notes, upcoming appointments, etc.  Non-urgent messages can be sent to your provider as well.   To learn more  about what you can do with MyChart, go to [email or web address].   Other Instructions

## 2024-04-07 ENCOUNTER — Other Ambulatory Visit (HOSPITAL_COMMUNITY)

## 2024-04-08 ENCOUNTER — Ambulatory Visit (HOSPITAL_COMMUNITY)
Admission: RE | Admit: 2024-04-08 | Discharge: 2024-04-08 | Disposition: A | Discharge status: Home / Self Care | Payer: Self-pay | Source: Ambulatory Visit | Attending: Cardiovascular Disease | Admitting: Cardiovascular Disease

## 2024-04-08 DIAGNOSIS — I2510 Atherosclerotic heart disease of native coronary artery without angina pectoris: Secondary | ICD-10-CM | POA: Insufficient documentation

## 2024-04-13 ENCOUNTER — Ambulatory Visit: Payer: Self-pay | Admitting: Cardiovascular Disease

## 2024-04-15 ENCOUNTER — Other Ambulatory Visit (HOSPITAL_COMMUNITY)

## 2024-04-15 ENCOUNTER — Other Ambulatory Visit: Payer: Self-pay

## 2024-04-15 DIAGNOSIS — E7800 Pure hypercholesterolemia, unspecified: Secondary | ICD-10-CM

## 2024-04-17 ENCOUNTER — Encounter: Payer: Self-pay | Admitting: Internal Medicine

## 2024-04-17 ENCOUNTER — Ambulatory Visit: Admitting: Internal Medicine

## 2024-04-17 VITALS — BP 134/68 | HR 61 | Temp 97.8°F | Resp 16 | Ht 70.0 in | Wt 217.8 lb

## 2024-04-17 DIAGNOSIS — E7800 Pure hypercholesterolemia, unspecified: Secondary | ICD-10-CM

## 2024-04-17 DIAGNOSIS — I10 Essential (primary) hypertension: Secondary | ICD-10-CM

## 2024-04-17 DIAGNOSIS — E1142 Type 2 diabetes mellitus with diabetic polyneuropathy: Secondary | ICD-10-CM | POA: Diagnosis not present

## 2024-04-17 MED ORDER — AMLODIPINE BESYLATE 10 MG PO TABS: 10.0000 mg | ORAL_TABLET | Freq: Every day | ORAL | 3 refills | Status: AC

## 2024-04-17 MED ORDER — CLONAZEPAM 0.5 MG PO TABS: 0.5000 mg | ORAL_TABLET | Freq: Every day | ORAL | 2 refills | Status: AC | PRN

## 2024-04-17 NOTE — Progress Notes (Signed)
 Office Visit  Subjective   Patient ID: Alex Castillo   DOB: December 28, 1945   Age: 78 y.o.   MRN: [id number]   Chief Complaint Chief Complaint  Patient presents with   Follow-up    3 month follow up      History of Present Illness The patient is a 78 year old Caucasian/White male who returns for a follow-up visit for his T2 diabetes.  He was diagnosed with new onset diabetes on 03/2018 with a HgBA1c of 9.7%.  This past year, his diabetes was not fully controlled and we increased his Metformin  ER from 500mg  BID to 1000mg  BID.  Over the interim, he states he has not had any problems.  He remains on Metformin  ER 1000 mg po daily (he is supposed to be BID) He is exercising with using the elliptical at the gym 3 times a week and he plays tennis.  He specifically denies unexplained abdominal pain, nausea or vomiting, diarrhea, or documented hypoglycemia.  He is checking his FSBS 2-3 times per week and his sugars have been running 120-140.   He came in fasting today in anticipation of lab work.  His last HgBA1c was done 3 months ago and was 6.8%. There are no long term complications of diabetes with no diabetic retinopathy, neuropathy, nephropathy or cardiovascular disease.  He had a yearly diabetic dilated eye exam done by Dr. Maudie at [facility] on 09/09/2023 and there was no evidence of diabetic retinopathy.   The patient is a 78 year old Caucasian/White male who presents for a follow-up evaluation of hypertension. Last year, his BP was not controlled and we restarted HCTZ 12.5mg  daily.  Since his last visit, he has not had any problems.  The patient has been checking his blood pressure at home.  His systolic BP's have been running in the 130's. The patient's current medications include: Cozaar  100 mg daily, HCTZ 12.5mg  daily and amlodipine  10mg  daily.  The patient has been tolerating his medications well. The patient denies any headache, visual changes, dizziness, lightheadness, chest pain,  shortness of breath, weakness/numbness, and edema.   Alex Castillo is a 78 yo male who returns today for routine followup on his cholesterol.   On his last visit, his cholesterol was not controlled and he is now taking his crestor  10mg  every other day.  This past year, he has had noncompliance with his crestor .  I ran his calcium  score through the [facility] predictor from his BP and cholesterol from 08/2022.  He had a MESA score of 31.9% for his 10 year risk of a CHD event.  I told him on 12/2022 that he needed to be on his crestor  and if this does not control him, we would consider a PSK9 inhibitor.  Over the interim, he states he has restarted his crestor .  The patient was seen by his cardiologist last year where they were going to change him from crestor  to zocor  due to him having some myalgias.  This past year he was contacted by [facility] upstream where they noted he was not filling his crestor .  He told them that he was having myalgias from his crestor  and he stopped the medication.   However, he had restarted his crestor  where he denied any myalgias with dosing it 3 times per week.  However, he stopped taking as described above.  We asked him to restart his crestor  5mg  3 times per week but he states he did not do this as he went  to see cardiology and wanted to see the results of his artery scans.  His cardiologist ordered a CT cardiac calcium  scoring which was done on 12/03/2022 and showed a coronary calcium  score of 446. He also had a mildly dilated ascending aorta up to 40 mm.  Overall, he states he is doing well and is without any complaints or problems at this time. He specifically denies nausea, vomiting, diarrhea, myalgias, and fatigue. He remains on dietary management as well as fish oil, coenzyme Q10 and crestor  10mg  every other day. He is fasting in anticipation for labs today.       Past Medical History Past Medical History:  Diagnosis Date   Adenomatous polyps    Anxiety    Arthritis    Asthma     BPH (benign prostatic hyperplasia)    Cancer (HCC)    basal cell on nose   Complication of anesthesia    slow to wake up trouble voiding after a back surgery    Diabetes (HCC)    Diverticulosis    ED (erectile dysfunction)    Esophageal stricture    First degree AV block    GERD (gastroesophageal reflux disease)    Hiatal hernia    History of kidney stones    History of peptic ulcer    HTN (hypertension)    ETT negative 10/08   Hymenoptera allergy    Hypercholesterolemia    Internal hemorrhoids    Palpitations    Staph infection    in right elbow   T2DM (type 2 diabetes mellitus) (HCC)    Unspecified gastritis and gastroduodenitis without mention of hemorrhage 02/19/2013     Allergies Allergies  Allergen Reactions   Bee Venom Anaphylaxis and Shortness Of Breath    Other Reaction(s): Unknown   Honey Bee Venom Anaphylaxis   Hydromorphone Hcl Nausea And Vomiting    Dilaudid     Medications  Current Outpatient Medications:    albuterol  (PROAIR  HFA) 108 (90 Base) MCG/ACT inhaler, Inhale two puffs every 4-6 hours if needed for cough or wheeze., Disp: 1 each, Rfl: 1   amLODipine  (NORVASC ) 10 MG tablet, TAKE 1 TABLET BY MOUTH EVERY DAY, Disp: 30 tablet, Rfl: 0   Apple Cider Vinegar 500 MG TABS, 2 tablets., Disp: , Rfl:    Ascorbic Acid (VITAMIN C PO), Take by mouth daily., Disp: , Rfl:    aspirin  81 MG tablet, Take 81 mg by mouth 2 (two) times daily., Disp: , Rfl:    B-Complex TABS, 1 tablet Orally once daily, Disp: , Rfl:    clonazePAM  (KLONOPIN ) 0.5 MG tablet, Take 1 tablet (0.5 mg total) by mouth daily as needed for anxiety., Disp: 30 tablet, Rfl: 2   Coenzyme Q10 (COQ-10 PO), Take 100 mg by mouth. Take 2 daily, Disp: , Rfl:    EPINEPHrine  0.3 mg/0.3 mL IJ SOAJ injection, Use as directed for life-threatening allergic reaction., Disp: 2 each, Rfl: 3   GARLIC PO, Take by mouth daily., Disp: , Rfl:    glucose blood (ONETOUCH VERIO) test strip, USE TO CHECK BLOOD SUGAR  TWICE DAILY, Disp: 100 strip, Rfl: 1   hydrochlorothiazide  (HYDRODIURIL ) 12.5 MG tablet, Take 1 tablet (12.5 mg total) by mouth daily., Disp: 90 tablet, Rfl: 2   Lancets (ONETOUCH DELICA PLUS LANCET33G) MISC, CHECK FASTING BLOOD SUGAR TWICE DAILY, Disp: 100 each, Rfl: 12   losartan  (COZAAR ) 100 MG tablet, TAKE 1 TABLET(100 MG) BY MOUTH DAILY, Disp: 90 tablet, Rfl: 0   Magnesium 400 MG  TABS, 1 tablet Orally once daily, Disp: , Rfl:    metFORMIN  (GLUCOPHAGE -XR) 500 MG 24 hr tablet, TAKE 1 TABLET BY MOUTH TWICE DAILY, Disp: 180 tablet, Rfl: 0   Multiple Vitamins-Minerals (ZINC PO), Take by mouth daily., Disp: , Rfl:    Omega-3 Fatty Acids (FISH OIL) 1200 MG CAPS, Take 1,200 mg by mouth daily., Disp: , Rfl:    RABEprazole  (ACIPHEX ) 20 MG tablet, TAKE 1 TABLET BY MOUTH EVERY DAY, Disp: 90 tablet, Rfl: 0   rosuvastatin  (CRESTOR ) 5 MG tablet, Take 2 tablets (10 mg total) by mouth every other day., Disp: 90 tablet, Rfl: 1   sertraline (ZOLOFT) 50 MG tablet, TAKE 1 TABLET BY MOUTH DAILY, Disp: 90 tablet, Rfl: 0   sildenafil (REVATIO) 20 MG tablet, SMARTSIG:2-5 Tablet(s) By Mouth, Disp: , Rfl:    tamsulosin  (FLOMAX ) 0.4 MG CAPS capsule, take 1 capsule by mouth every day as needed, Disp: , Rfl:    TRUE METRIX BLOOD GLUCOSE TEST test strip, daily., Disp: , Rfl:    vitamin E 400 UNIT capsule, Take 400 Units by mouth daily., Disp: , Rfl:    Review of Systems Review of Systems  Constitutional:  Negative for chills, fever, malaise/fatigue and weight loss.  Eyes:  Negative for blurred vision.  Respiratory:  Negative for cough and shortness of breath.   Cardiovascular:  Negative for chest pain, palpitations and leg swelling.  Gastrointestinal:  Negative for abdominal pain, constipation, diarrhea, nausea and vomiting.  Genitourinary:  Negative for frequency.  Musculoskeletal:  Negative for myalgias.  Skin:  Negative for itching and rash.  Neurological:  Negative for dizziness, weakness and headaches.        Objective:    Vitals BP 134/68   Pulse 61   Temp 97.8 F (36.6 C) (Temporal)   Resp 16   Ht 5' 10 (1.778 m)   Wt 217 lb 12.8 oz (98.8 kg)   SpO2 95%   BMI 31.25 kg/m    Physical Examination Physical Exam Constitutional:      Appearance: Normal appearance. He is not ill-appearing.  Cardiovascular:     Rate and Rhythm: Normal rate and regular rhythm.     Pulses: Normal pulses.     Heart sounds: No murmur heard.    No friction rub. No gallop.  Pulmonary:     Effort: Pulmonary effort is normal. No respiratory distress.     Breath sounds: No wheezing, rhonchi or rales.  Abdominal:     General: Abdomen is flat. Bowel sounds are normal. There is no distension.     Palpations: Abdomen is soft.     Tenderness: There is no abdominal tenderness.  Musculoskeletal:     Right lower leg: No edema.     Left lower leg: No edema.  Skin:    General: Skin is warm and dry.     Findings: No rash.  Neurological:     General: No focal deficit present.     Mental Status: He is alert and oriented to person, place, and time.  Psychiatric:        Mood and Affect: Mood normal.        Behavior: Behavior normal.        Assessment & Plan:   Essential hypertension His BP is doing well and is controlled.  WE will continue his current medications.  Diabetic polyneuropathy associated with type 2 diabetes mellitus (HCC) We will check his HgBa1c again today.   He is supposed to be on metformin  ER  1000mg  BID but he only takes this once a day.  We will adjust as necessary.  Hypercholesterolemia We will recheck his FLP today as we adjust his medication on his last visit.    Return in about 3 months (around 07/16/2024).   Selinda Fleeta Finger, MD

## 2024-04-17 NOTE — Assessment & Plan Note (Signed)
 We will check his HgBa1c again today.   He is supposed to be on metformin  ER 1000mg  BID but he only takes this once a day.  We will adjust as necessary.

## 2024-04-17 NOTE — Assessment & Plan Note (Signed)
 We will recheck his FLP today as we adjust his medication on his last visit.

## 2024-04-17 NOTE — Assessment & Plan Note (Signed)
His BP is doing well and is controlled.  WE will continue his current medications.

## 2024-04-19 ENCOUNTER — Other Ambulatory Visit: Payer: Self-pay | Admitting: Cardiovascular Disease

## 2024-04-20 ENCOUNTER — Other Ambulatory Visit: Payer: Self-pay | Admitting: Cardiovascular Disease

## 2024-04-21 ENCOUNTER — Ambulatory Visit

## 2024-04-23 ENCOUNTER — Ambulatory Visit (INDEPENDENT_AMBULATORY_CARE_PROVIDER_SITE_OTHER): Admitting: *Deleted

## 2024-04-23 DIAGNOSIS — Z91038 Other insect allergy status: Secondary | ICD-10-CM | POA: Diagnosis not present

## 2024-04-27 ENCOUNTER — Other Ambulatory Visit: Payer: Self-pay | Admitting: Internal Medicine

## 2024-05-18 NOTE — Addendum Note (Signed)
 Addended by: LENETTA LACKS on: 05/18/2024 10:10 AM   Modules accepted: Orders

## 2024-05-19 LAB — CMP14 + ANION GAP
ALT: 16 IU/L (ref 0–44)
AST: 18 IU/L (ref 0–40)
Albumin: 4.5 g/dL (ref 3.8–4.8)
Alkaline Phosphatase: 67 IU/L (ref 47–123)
Anion Gap: 15 mmol/L (ref 10.0–18.0)
BUN/Creatinine Ratio: 15 (ref 10–24)
BUN: 15 mg/dL (ref 8–27)
Bilirubin Total: 0.7 mg/dL (ref 0.0–1.2)
CO2: 25 mmol/L (ref 20–29)
Calcium: 9.7 mg/dL (ref 8.6–10.2)
Chloride: 98 mmol/L (ref 96–106)
Creatinine, Ser: 0.99 mg/dL (ref 0.76–1.27)
Globulin, Total: 2.8 g/dL (ref 1.5–4.5)
Glucose: 140 mg/dL — ABNORMAL HIGH (ref 70–99)
Potassium: 4.4 mmol/L (ref 3.5–5.2)
Sodium: 138 mmol/L (ref 134–144)
Total Protein: 7.3 g/dL (ref 6.0–8.5)
eGFR: 79 mL/min/1.73

## 2024-05-19 LAB — LIPID PANEL
Chol/HDL Ratio: 4 ratio (ref 0.0–5.0)
Cholesterol, Total: 168 mg/dL (ref 100–199)
HDL: 42 mg/dL
LDL Chol Calc (NIH): 106 mg/dL — ABNORMAL HIGH (ref 0–99)
Triglycerides: 109 mg/dL (ref 0–149)
VLDL Cholesterol Cal: 20 mg/dL (ref 5–40)

## 2024-05-19 LAB — HEMOGLOBIN A1C
Est. average glucose Bld gHb Est-mCnc: 146 mg/dL
Hgb A1c MFr Bld: 6.7 % — ABNORMAL HIGH (ref 4.8–5.6)

## 2024-05-21 ENCOUNTER — Telehealth: Payer: Self-pay | Admitting: Cardiovascular Disease

## 2024-05-21 NOTE — Telephone Encounter (Signed)
 Pt wanted to know if PET scan for 1/13 was authorized with is new [facility] plan. Please advise

## 2024-05-22 ENCOUNTER — Other Ambulatory Visit (HOSPITAL_COMMUNITY): Payer: Self-pay | Admitting: *Deleted

## 2024-05-22 ENCOUNTER — Encounter (HOSPITAL_COMMUNITY): Payer: Self-pay

## 2024-05-22 DIAGNOSIS — R931 Abnormal findings on diagnostic imaging of heart and coronary circulation: Secondary | ICD-10-CM

## 2024-05-22 NOTE — Telephone Encounter (Signed)
 Returned call, left a vm msg (per DPR) that test was authorized and to CB should he have additional questions.

## 2024-05-25 ENCOUNTER — Telehealth (HOSPITAL_COMMUNITY): Payer: Self-pay | Admitting: *Deleted

## 2024-05-25 NOTE — Telephone Encounter (Signed)
 Reaching out to patient to offer assistance regarding upcoming cardiac imaging study; pt verbalizes understanding of appt date/time, parking situation and where to check in, pre-test NPO status and medications ordered, and verified current allergies; name and call back number provided for further questions should they arise Sid Seats RN Navigator Cardiac Imaging [facility] [phone] office [phone] cell  Patient aware to avoid caffeine for 12 hours prior, hold flomax  and sildenafil.

## 2024-05-26 ENCOUNTER — Ambulatory Visit: Payer: Self-pay | Admitting: Cardiovascular Disease

## 2024-05-26 ENCOUNTER — Ambulatory Visit (HOSPITAL_COMMUNITY)
Admission: RE | Admit: 2024-05-26 | Discharge: 2024-05-26 | Disposition: A | Discharge status: Home / Self Care | Source: Ambulatory Visit | Attending: Cardiovascular Disease | Admitting: Cardiovascular Disease

## 2024-05-26 DIAGNOSIS — I700 Atherosclerosis of aorta: Secondary | ICD-10-CM | POA: Insufficient documentation

## 2024-05-26 DIAGNOSIS — E785 Hyperlipidemia, unspecified: Secondary | ICD-10-CM | POA: Diagnosis not present

## 2024-05-26 DIAGNOSIS — R931 Abnormal findings on diagnostic imaging of heart and coronary circulation: Secondary | ICD-10-CM | POA: Insufficient documentation

## 2024-05-26 DIAGNOSIS — I2510 Atherosclerotic heart disease of native coronary artery without angina pectoris: Secondary | ICD-10-CM | POA: Insufficient documentation

## 2024-05-26 LAB — NM PET CT CARDIAC PERFUSION MULTI W/ABSOLUTE BLOODFLOW
MBFR: 2.54
Nuc Rest EF: 67 %
Nuc Stress EF: 72 %
Rest MBF: 0.97 ml/g/min
Rest Nuclear Isotope Dose: 25.5 mCi
ST Depression (mm): 0 mm
Stress MBF: 2.46 ml/g/min
Stress Nuclear Isotope Dose: 25.4 mCi
TID: 1.02

## 2024-05-26 MED ORDER — RUBIDIUM RB82 GENERATOR (RUBYFILL)
25.4500 | PACK | Freq: Once | INTRAVENOUS | Status: AC
  Administered 2024-05-26: 25.45 via INTRAVENOUS

## 2024-05-26 MED ORDER — RUBIDIUM RB82 GENERATOR (RUBYFILL)
25.4400 | PACK | Freq: Once | INTRAVENOUS | Status: AC
  Administered 2024-05-26: 25.44 via INTRAVENOUS

## 2024-05-26 MED ORDER — REGADENOSON 0.4 MG/5ML IV SOLN
0.4000 mg | Freq: Once | INTRAVENOUS | Status: AC
  Administered 2024-05-26: 0.4 mg via INTRAVENOUS

## 2024-05-26 MED ORDER — REGADENOSON 0.4 MG/5ML IV SOLN
INTRAVENOUS | Status: AC
  Filled 2024-05-26: qty 5

## 2024-06-01 ENCOUNTER — Ambulatory Visit: Payer: Self-pay

## 2024-06-01 NOTE — Progress Notes (Signed)
Patient called.  Left message for patient to call back.

## 2024-06-01 NOTE — Progress Notes (Signed)
 Patient called.  Patient aware. His labs are okay except cholesterol is high.

## 2024-06-13 ENCOUNTER — Other Ambulatory Visit: Payer: Self-pay | Admitting: Internal Medicine

## 2024-06-16 ENCOUNTER — Ambulatory Visit

## 2024-06-16 DIAGNOSIS — Z91038 Other insect allergy status: Secondary | ICD-10-CM

## 2024-06-18 ENCOUNTER — Ambulatory Visit

## 2024-06-25 ENCOUNTER — Ambulatory Visit: Payer: Self-pay | Admitting: Pharmacist

## 2024-07-16 ENCOUNTER — Ambulatory Visit: Admitting: Internal Medicine

## 2024-08-11 ENCOUNTER — Ambulatory Visit
